# Patient Record
Sex: Male | Born: 1940 | Race: White | Hispanic: No | Marital: Married | Smoking: Former smoker
Health system: Southern US, Community
[De-identification: ages and names within clinical notes are randomized; demographics above are authoritative.]

## PROBLEM LIST (undated history)

## (undated) DIAGNOSIS — M5134 Other intervertebral disc degeneration, thoracic region: Secondary | ICD-10-CM

## (undated) DIAGNOSIS — Z955 Presence of coronary angioplasty implant and graft: Secondary | ICD-10-CM

## (undated) DIAGNOSIS — H3530 Unspecified macular degeneration: Secondary | ICD-10-CM

## (undated) DIAGNOSIS — Z8614 Personal history of Methicillin resistant Staphylococcus aureus infection: Secondary | ICD-10-CM

## (undated) DIAGNOSIS — I2510 Atherosclerotic heart disease of native coronary artery without angina pectoris: Secondary | ICD-10-CM

## (undated) DIAGNOSIS — I10 Essential (primary) hypertension: Secondary | ICD-10-CM

## (undated) DIAGNOSIS — R011 Cardiac murmur, unspecified: Secondary | ICD-10-CM

## (undated) DIAGNOSIS — E785 Hyperlipidemia, unspecified: Secondary | ICD-10-CM

## (undated) DIAGNOSIS — L309 Dermatitis, unspecified: Secondary | ICD-10-CM

## (undated) DIAGNOSIS — I219 Acute myocardial infarction, unspecified: Secondary | ICD-10-CM

## (undated) DIAGNOSIS — K219 Gastro-esophageal reflux disease without esophagitis: Secondary | ICD-10-CM

## (undated) DIAGNOSIS — D649 Anemia, unspecified: Secondary | ICD-10-CM

## (undated) DIAGNOSIS — N189 Chronic kidney disease, unspecified: Secondary | ICD-10-CM

## (undated) DIAGNOSIS — E039 Hypothyroidism, unspecified: Secondary | ICD-10-CM

## (undated) DIAGNOSIS — M1990 Unspecified osteoarthritis, unspecified site: Secondary | ICD-10-CM

## (undated) DIAGNOSIS — I739 Peripheral vascular disease, unspecified: Secondary | ICD-10-CM

## (undated) DIAGNOSIS — E119 Type 2 diabetes mellitus without complications: Secondary | ICD-10-CM

## (undated) HISTORY — DX: Peripheral vascular disease, unspecified: I73.9

## (undated) HISTORY — DX: Essential (primary) hypertension: I10

## (undated) HISTORY — DX: Anemia, unspecified: D64.9

## (undated) HISTORY — DX: Unspecified osteoarthritis, unspecified site: M19.90

## (undated) HISTORY — PX: VASECTOMY: SHX75

## (undated) HISTORY — DX: Atherosclerotic heart disease of native coronary artery without angina pectoris: I25.10

## (undated) HISTORY — DX: Unspecified macular degeneration: H35.30

## (undated) HISTORY — PX: JOINT REPLACEMENT: SHX530

## (undated) HISTORY — PX: TONSILLECTOMY: SUR1361

## (undated) HISTORY — DX: Hyperlipidemia, unspecified: E78.5

## (undated) HISTORY — PX: BACK SURGERY: SHX140

## (undated) HISTORY — DX: Dermatitis, unspecified: L30.9

## (undated) HISTORY — PX: CORONARY ANGIOPLASTY: SHX604

---

## 1997-03-04 DIAGNOSIS — I219 Acute myocardial infarction, unspecified: Secondary | ICD-10-CM

## 1997-03-04 HISTORY — DX: Acute myocardial infarction, unspecified: I21.9

## 2008-03-14 ENCOUNTER — Ambulatory Visit: Payer: Self-pay

## 2009-08-16 ENCOUNTER — Ambulatory Visit: Payer: Self-pay | Admitting: Internal Medicine

## 2010-12-07 ENCOUNTER — Ambulatory Visit: Payer: Self-pay | Admitting: Orthopedic Surgery

## 2010-12-17 ENCOUNTER — Encounter: Payer: Self-pay | Admitting: Orthopedic Surgery

## 2010-12-31 ENCOUNTER — Ambulatory Visit: Payer: Self-pay | Admitting: Orthopedic Surgery

## 2011-01-03 ENCOUNTER — Encounter: Payer: Self-pay | Admitting: Orthopedic Surgery

## 2011-01-15 ENCOUNTER — Inpatient Hospital Stay: Payer: Self-pay | Admitting: Orthopedic Surgery

## 2011-01-17 LAB — PATHOLOGY REPORT

## 2011-02-12 ENCOUNTER — Encounter: Payer: Self-pay | Admitting: Orthopedic Surgery

## 2011-02-12 ENCOUNTER — Ambulatory Visit: Payer: Self-pay | Admitting: Internal Medicine

## 2011-03-04 ENCOUNTER — Ambulatory Visit: Payer: Self-pay | Admitting: Internal Medicine

## 2011-03-05 ENCOUNTER — Encounter: Payer: Self-pay | Admitting: Orthopedic Surgery

## 2011-04-08 ENCOUNTER — Encounter: Payer: Self-pay | Admitting: Orthopedic Surgery

## 2011-05-06 ENCOUNTER — Encounter: Payer: Self-pay | Admitting: Orthopedic Surgery

## 2011-05-10 ENCOUNTER — Ambulatory Visit: Payer: Self-pay | Admitting: Orthopedic Surgery

## 2011-06-03 ENCOUNTER — Encounter: Payer: Self-pay | Admitting: Orthopedic Surgery

## 2011-12-05 ENCOUNTER — Ambulatory Visit: Payer: Self-pay | Admitting: Orthopedic Surgery

## 2011-12-30 ENCOUNTER — Ambulatory Visit: Payer: Self-pay | Admitting: Orthopedic Surgery

## 2011-12-30 LAB — PROTIME-INR: Prothrombin Time: 12.8 secs (ref 11.5–14.7)

## 2012-02-13 ENCOUNTER — Inpatient Hospital Stay: Payer: Self-pay | Admitting: Orthopedic Surgery

## 2012-02-14 LAB — BASIC METABOLIC PANEL
Anion Gap: 9 (ref 7–16)
Calcium, Total: 8 mg/dL — ABNORMAL LOW (ref 8.5–10.1)
Chloride: 105 mmol/L (ref 98–107)
Creatinine: 1.23 mg/dL (ref 0.60–1.30)
EGFR (African American): >60
EGFR (Non-African Amer.): 58 — ABNORMAL LOW
Osmolality: 276 (ref 275–301)

## 2012-02-14 LAB — PLATELET COUNT: Platelet: 153 10*3/uL (ref 150–440)

## 2012-02-14 LAB — HEMOGLOBIN: HGB: 12.4 g/dL — ABNORMAL LOW (ref 13.0–18.0)

## 2012-02-17 LAB — PATHOLOGY REPORT

## 2012-03-04 DIAGNOSIS — Z955 Presence of coronary angioplasty implant and graft: Secondary | ICD-10-CM

## 2012-03-04 HISTORY — DX: Presence of coronary angioplasty implant and graft: Z95.5

## 2012-03-06 ENCOUNTER — Other Ambulatory Visit: Payer: Self-pay

## 2012-03-10 LAB — WOUND CULTURE

## 2013-01-20 ENCOUNTER — Ambulatory Visit: Payer: Self-pay | Admitting: Physical Medicine and Rehabilitation

## 2013-02-01 DIAGNOSIS — Z8614 Personal history of Methicillin resistant Staphylococcus aureus infection: Secondary | ICD-10-CM

## 2013-02-01 HISTORY — DX: Personal history of Methicillin resistant Staphylococcus aureus infection: Z86.14

## 2013-02-17 ENCOUNTER — Ambulatory Visit: Payer: Self-pay | Admitting: Internal Medicine

## 2013-02-18 LAB — CK TOTAL AND CKMB (NOT AT ARMC)
CK, Total: 62 U/L (ref 35–232)
CK-MB: 1.8 ng/mL (ref 0.5–3.6)

## 2013-02-18 LAB — BASIC METABOLIC PANEL
Anion Gap: 4 — ABNORMAL LOW (ref 7–16)
BUN: 15 mg/dL (ref 7–18)
Calcium, Total: 9.4 mg/dL (ref 8.5–10.1)
Creatinine: 1.13 mg/dL (ref 0.60–1.30)
EGFR (Non-African Amer.): >60
Osmolality: 275 (ref 275–301)
Potassium: 4.3 mmol/L (ref 3.5–5.1)
Sodium: 136 mmol/L (ref 136–145)

## 2013-03-15 ENCOUNTER — Other Ambulatory Visit: Payer: Self-pay

## 2013-03-17 ENCOUNTER — Ambulatory Visit: Payer: Self-pay

## 2013-03-19 LAB — WOUND CULTURE

## 2014-06-21 NOTE — Discharge Summary (Signed)
PATIENT NAME:  [name] [name], LUKEHART MR#:  [id number] DATE OF BIRTH:  1940/04/08  ADMITTING DIAGNOSIS:  Right knee osteoarthritis.   DISCHARGE DIAGNOSIS:  Right knee osteoarthritis.   PROCEDURE:  Right total knee replacement.   ANESTHESIA:  Spinal.   SURGEON:  Laurene Footman, M.D.   ASSISTANT: Duanne Guess, PA-C.   ESTIMATED BLOOD LOSS: 75 mL.  TOURNIQUET TIME: 74 minutes at 300 mmHg.   COMPLICATIONS: None.   SPECIMEN:  Cut ends of bone.   IMPLANTS:   Medacta GMK primary total knee system, 6 standard femur, right fixed tibia, 10 mm standard tibial insert and a #4 patella, all components cemented.   HISTORY OF PRESENT ILLNESS:  The patient is 74 year old white male who has been having knee pain for the last few years. He was tried anti-inflammatory medications, physical therapy, cortisone injections and Synvisc injections with no relief. The patient continues to have pain despite nonoperative measures. The right knee continues to pop. He was scheduled for operation previously but due to unforeseen circumstances, the surgery was canceled.   PHYSICAL EXAMINATION: GENERAL: Well developed, well nourished.  Alert and oriented x3, no acute distress. Sensation intact to the right lower extremity.  CARDIOVASCULAR: Capillary refill brisk, no edema or effusion of the right lower extremity.  HEENT: Normocephalic, atraumatic. Pupils equal, round, and reactive light and accommodation.  HEART: Regular rate and rhythm. No murmurs.  RESPIRATORY:  Clear to auscultation bilaterally. No wheezes, rhonchi or crackles. Chest expansion is symmetrical.  MUSCULOSKELETAL: Right knee range of motion is equal, full and painless. Tender to palpation over the medial joint line. Ligamentous exam is stable. Negative McMurray. His knee range of motion is 0 to 95 degrees.  SKIN: No scars, rashes or lesions over the right lower extremity.   HOSPITAL COURSE:  The patient was admitted hospital on 02/13/2012. He had surgery  that same day and was brought to the orthopedic floor from the PAC-U in stable condition. On postoperative day 1, the patient's vitals and lab work were monitored. The patient's labs and vital signs stable. The patient progressed well on postoperative day 1 with physical therapy. On postoperative day 2 the patient was doing well, and progressed to 180 feet of ambulation. By postoperative day 3, the patient was stable and ready for discharge home with home physical therapy.   DISCHARGE INSTRUCTIONS:  The patient may gradually increase weight-bearing on the affected extremity.  Thigh-high TED hose on both legs and remove at bedtime. Replace when arising the next morning. He may resume a regular diet as tolerated. Resume typical home medications. He should take Xarelto 10 mg every morning until completed.   PAIN MEDICATIONS: Tylenol 650 to 1000 mg every 6 hours as needed for pain, oxycodone 5 to 10 mg every 4 hours as needed for pain. Wound care: Continue using Polar Care unit, maintaining temperature between 40 and 50 degrees Fahrenheit. Do not get the dressing or bandage wet or dirty. Call [facility] if the dressing gets water under it. Leave dressing on.  Symptoms to report:  Call [facility] if any of the following occur: Bright red bleeding from the incision wound, fever above 101.5 degrees, redness, swelling or drainage at the incision. Call [facility] if you experience any increased pain, numbness or weakness in legs, bowel or bladder symptoms.   REFERRALS: The patient referred to home physical therapy for continuation of rehab program. Please call [facility] if a therapist has not contacted you within 102  hours of your return home. The patient has a follow-up appointment at [facility] in two weeks, and he should call and schedule an appointment.   DISCHARGE MEDICATIONS: Metformin 500 mg oral tablet 1 tab 2 times a day,  Crestor 20 mg oral tablet 1 tablet orally once a day at bedtime, Prilosec OTC 20 mg oral delayed-release tablet 1 tablet orally once a day at bedtime, NitroQuick 1 tab sublingual as needed.  PreserVision oral tablet 1 tablet orally 2 times a day stopped 14 days preoperatively, Vitamin B12 2500 mcg 1 tab orally once a day in the morning, Tramadol 50 mg oral tablet 1 tablet orally 2 times a day, triamcinolone cream apply topically to rash on legs as needed.     ____________________________ Duanne Guess, PA-C tcg:ct D: 02/16/2012 03:06:00 ET T: 02/17/2012 10:17:52 ET JOB#: [id number]  cc: Duanne Guess, PA-C, <Dictator> Duanne Guess PA ELECTRONICALLY SIGNED 02/19/2012 13:22

## 2014-06-21 NOTE — Op Note (Signed)
PATIENT NAME:  [name] [name], [name] [name] MR#:  [id number] DATE OF BIRTH:  [birth year]  DATE OF PROCEDURE:  02/13/2012  PREOPERATIVE DIAGNOSIS: Right knee osteoarthritis.   POSTOPERATIVE DIAGNOSIS: Right knee osteoarthritis.   PROCEDURE: Right total knee replacement.   ANESTHESIA: Spinal.   SURGEON: Laurene Footman, MD  ASSISTANT: Rachelle Hora, PA-C.   DESCRIPTION OF PROCEDURE: Patient was brought to the Operating Room and after adequate spinal anesthesia was obtained, the right leg was prepped and draped in the usual sterile fashion with a tourniquet applied to the upper thigh. An Alvarado legholder was used during the procedure to maintain flexion and extension as required. After prepping and draping in the usual sterile fashion, appropriate patient identification and timeout procedures were carried out the leg was exsanguinated with an Esmarch and the tourniquet raised to 300 mmHg. A midline skin incision was made followed by a medial parapatellar arthrotomy. Inspection of the knee revealed exposed and eburnated bone on the medial femoral condyle, bone as well as on the tibial condyle. The patellofemoral joint also had exposed bone for most of the patellar surface and the lateral compartment had mild to moderate degenerative change present. The anterior cruciate ligament was excised along with the fat pad. The proximal tibia cutting block was applied from the [place] preoperative template and alignment checked and proximal tibia cut carried out with excision of the medial and lateral meniscus. Next, the femoral cutting block was applied. Anterior, posterior, and chamfer cuts made after checking alignment. The 6 cutting guide was applied to do so along with the notch cut for the trochlear groove and distal femoral drill holes after trial components fit well. On the tibial side a size 5 tibia was utilized and the central hole drilled. After trials fit well with good stability in flexion and extension with a 10 mm  standard implant, patella was cut with the guide and measured a 4. These were the same size as he had on the contralateral side previously. The bony surfaces were thoroughly irrigated and dried. Posterior capsules injected with a combination of morphine, Toradol and Sensorcaine. The tibial component was cemented into place first followed by the polyethylene, followed by the femur and the knee is held in extension with the patellar button clamped into place. All components cemented with antibiotic cement with his history of diabetes. After the cement had set, excess cement was removed and the knee was thoroughly irrigated. The tourniquet was let down at the close of the case. The arthrotomy was closed using a heavy quill suture, 2-0 quill subcutaneously and skin staples. Xeroform, 4 x 4's, ABD, Webril, and Ace wrap were applied along with a knee immobilizer and Polar Care. Patient was sent to recovery room in stable condition.   ESTIMATED BLOOD LOSS: 75 mL.  TOURNIQUET TIME: 74 minutes at 300 mmHg.   COMPLICATIONS: None.   SPECIMEN: Cut ends of bone.   IMPLANTS: Medacta GMK primary total knee system, 6 standard femur, right fixed tibia, 10 mm standard tibial insert and a 4 patella, all components cemented.    ____________________________ Laurene Footman, MD mjm:cms D: 02/13/2012 13:13:03 ET T: 02/13/2012 13:22:32 ET JOB#: [id number]  cc: Laurene Footman, MD, <Dictator>  Laurene Footman MD ELECTRONICALLY SIGNED 02/13/2012 15:33

## 2014-06-24 NOTE — Discharge Summary (Signed)
PATIENT NAME:  [name] [name], [name] [name] MR#:  [id number] DATE OF BIRTH:  14-Jul-1940  DATE OF ADMISSION:  02/17/2013 DATE OF DISCHARGE:  02/18/2013  DISCHARGE DIAGNOSIS: 1.  Stable angina.  2.  Hypertension.  3.  Hyperlipidemia.  4.  Diabetes.   HISTORY: This is a 74 year old male with hypertension, hyperlipidemia, diabetes, and coronary artery disease status post previous PCI and stent placement of left anterior descending artery who had had progressive symptoms of [place] class III anginal equivalent of which he had a stress test showing inferior myocardial perfusion defect consistent with an intermediate to high risk procedure and underwent cardiac catheterization showing patent stent and a 60% mid LAD stenosis with a 40% circumflex stenosis and a 95% right coronary artery stenosis. The patient underwent PCI and bare metal stent placement of the mid right coronary artery without complication and has done fairly well. The patient had appropriate hydration without complications. He was ambulating well without any further symptoms and discharged to home in good condition.   DISCHARGE MEDICATIONS: 1.  Metformin 500 mg twice per day, although we will hold this for 2 days and restart thereafter. 2.  Crestor 20 mg each day. 3.  Aspirin 325 mg each day. 4.  Plavix 75 mg each day. 5.  Tramadol 50 mg twice a day. 6.  Cyclobenzaprine 10 mg each day.   DISCHARGE INSTRUCTIONS: He is to follow up in 2 weeks with Dr. [name] for further treatment options. ____________________________ Corey Skains, MD bjk:sb D: 02/18/2013 08:02:07 ET T: 02/18/2013 08:49:35 ET JOB#: [id number]  cc: Corey Skains, MD, <Dictator> Corey Skains MD ELECTRONICALLY SIGNED 03/02/2013 8:04

## 2014-08-22 ENCOUNTER — Encounter: Payer: Medicare Other | Attending: Surgery | Admitting: Surgery

## 2014-08-22 DIAGNOSIS — L97211 Non-pressure chronic ulcer of right calf limited to breakdown of skin: Secondary | ICD-10-CM | POA: Insufficient documentation

## 2014-08-22 DIAGNOSIS — I252 Old myocardial infarction: Secondary | ICD-10-CM | POA: Diagnosis not present

## 2014-08-22 DIAGNOSIS — I87311 Chronic venous hypertension (idiopathic) with ulcer of right lower extremity: Secondary | ICD-10-CM | POA: Insufficient documentation

## 2014-08-22 DIAGNOSIS — D649 Anemia, unspecified: Secondary | ICD-10-CM | POA: Insufficient documentation

## 2014-08-22 DIAGNOSIS — E11622 Type 2 diabetes mellitus with other skin ulcer: Secondary | ICD-10-CM | POA: Diagnosis not present

## 2014-08-22 DIAGNOSIS — I10 Essential (primary) hypertension: Secondary | ICD-10-CM | POA: Insufficient documentation

## 2014-08-22 DIAGNOSIS — Z87891 Personal history of nicotine dependence: Secondary | ICD-10-CM | POA: Insufficient documentation

## 2014-08-22 NOTE — Progress Notes (Signed)
KENNER, LEWAN ([id number]) Visit Report for 08/22/2014 Chief Complaint Document Details Patient Name: [name] [name] Date of Service: 08/22/2014 9:00 AM Medical Record Number: [id number] Patient Account Number: [id number] Date of Birth/Sex: [birth year] (74 y.o. Male) Treating RN: Primary Care Physician: Fulton Reek Other Clinician: Referring Physician: Fulton Reek Treating Physician/Extender: Frann Rider in Treatment: 0 Information Obtained from: Patient Chief Complaint Patient presents to the wound care center for a consult due non healing wound. Swelling and ulceration of the right lower extremity for about a month. Electronic Signature(s) Signed: 08/22/2014 12:15:23 PM By: Christin Fudge MD, FACS Entered By: Christin Fudge on 08/22/2014 10:31:13 [name] [name] ([id number]) -------------------------------------------------------------------------------- Debridement Details Patient Name: [name] [name] Date of Service: 08/22/2014 9:00 AM Medical Record Number: [id number] Patient Account Number: [id number] Date of Birth/Sex: December 08, 1940 (74 y.o. Male) Treating RN: Primary Care Physician: Fulton Reek Other Clinician: Referring Physician: Fulton Reek Treating Physician/Extender: Frann Rider in Treatment: 0 Debridement Performed for Wound #1 Right Lower Leg Assessment: Performed By: Physician Pat Patrick., MD Debridement: Open Wound/Selective Debridement Selective Description: Pre-procedure Yes Verification/Time Out Taken: Start Time: 10:17 Pain Control: Lidocaine 4% Topical Solution Level: Skin/Dermis Total Area Debrided (L x 2.2 (cm) x 2.6 (cm) = 5.72 (cm) W): Tissue and other Non-Viable, Eschar, Exudate, Fibrin/Slough material debrided: Instrument: Forceps, Scissors, Other Bleeding: Minimum Hemostasis Achieved: Pressure End Time: 10:22 Procedural Pain: 0 Post Procedural Pain: 0 Response to Treatment: Procedure was tolerated well Post  Debridement Measurements of Total Wound Length: (cm) 2.2 Width: (cm) 2.6 Depth: (cm) 0.1 Volume: (cm) 0.449 Electronic Signature(s) Signed: 08/22/2014 12:15:23 PM By: Christin Fudge MD, FACS Entered By: Christin Fudge on 08/22/2014 10:30:11 [name] [name] ([id number]) -------------------------------------------------------------------------------- Debridement Details Patient Name: [name] [name] Date of Service: 08/22/2014 9:00 AM Medical Record Number: [id number] Patient Account Number: [id number] Date of Birth/Sex: 12/12/1940 (74 y.o. Male) Treating RN: Primary Care Physician: Fulton Reek Other Clinician: Referring Physician: Fulton Reek Treating Physician/Extender: Frann Rider in Treatment: 0 Debridement Performed for Wound #2 Right,Proximal,Posterior Lower Leg Assessment: Performed By: Physician Pat Patrick., MD Debridement: Open Wound/Selective Debridement Selective Description: Pre-procedure Yes Verification/Time Out Taken: Start Time: 10:17 Pain Control: Lidocaine 4% Topical Solution Level: Skin/Dermis Total Area Debrided (L x 1.9 (cm) x 1.2 (cm) = 2.28 (cm) W): Tissue and other Non-Viable, Eschar, Exudate, Fibrin/Slough material debrided: Instrument: Forceps, Scissors, Other Bleeding: Minimum Hemostasis Achieved: Pressure End Time: 10:22 Procedural Pain: 0 Post Procedural Pain: 0 Response to Treatment: Procedure was tolerated well Post Debridement Measurements of Total Wound Length: (cm) 1.9 Width: (cm) 1.2 Depth: (cm) 0.1 Volume: (cm) 0.179 Electronic Signature(s) Signed: 08/22/2014 12:15:23 PM By: Christin Fudge MD, FACS Entered By: Christin Fudge on 08/22/2014 10:30:43 [name] [name] ([id number]) -------------------------------------------------------------------------------- HPI Details Patient Name: [name] [name] Date of Service: 08/22/2014 9:00 AM Medical Record Number: [id number] Patient Account Number: [id number] Date of  Birth/Sex: [birth year] (74 y.o. Male) Treating RN: Primary Care Physician: Fulton Reek Other Clinician: Referring Physician: Fulton Reek Treating Physician/Extender: Frann Rider in Treatment: 0 History of Present Illness Location: right lower extremity Quality: Patient reports No Pain. Severity: Patient states wound are getting worse. Duration: Patient has had the wound for > 1 months prior to seeking treatment at the wound center Context: The wound appeared gradually over time Modifying Factors: Other treatment(s) tried include: local application of Neosporin ointment. Associated Signs and Symptoms: Patient reports having difficulty standing for long periods. HPI Description: 74 year old gentleman who is known to have diabetes mellitus type 2, arthritis, arrhythmia, hypertension, coronary artery disease, history of  PTCA, vein surgery, status post left and right total knee replacement, back surgery, previous surgery for varicose veins, comes with ulceration on his left lower extremity which has been there for over a month. He does [name] [name] his diabetes control is fairly good and his hemoglobin A1c done about 2 weeks ago was normal as per his doctor. About 4-5 years ago he saw the surgeons at the [facility] and he did have a endovenous procedure done but does not have any details and was told to wear compression stockings after this. He has not been wearing his compression stockings for a while and seldom wears them. Electronic Signature(s) Signed: 08/22/2014 12:15:23 PM By: Christin Fudge MD, FACS Entered By: Christin Fudge on 08/22/2014 10:34:04 [name] [name] ([id number]) -------------------------------------------------------------------------------- Physical Exam Details Patient Name: [name] [name] Date of Service: 08/22/2014 9:00 AM Medical Record Number: [id number] Patient Account Number: [id number] Date of Birth/Sex: 1940/09/05 (74 y.o.  Male) Treating RN: Primary Care Physician: Fulton Reek Other Clinician: Referring Physician: Fulton Reek Treating Physician/Extender: Frann Rider in Treatment: 0 Constitutional . Pulse regular. Respirations normal and unlabored. Afebrile. . Eyes Nonicteric. Reactive to light. Ears, Nose, Mouth, and Throat Lips, teeth, and gums WNL.[name] Moist mucosa without lesions . Neck supple and nontender. No palpable supraclavicular or cervical adenopathy. Normal sized without goiter. Respiratory WNL. No retractions.. Cardiovascular Pedal Pulses WNL. ABI on the left was 0.98 and on the right was 0.95.[name] No clubbing, cyanosis or but has mild edema and some evidence of stasis dermatitis and varicose veins.. Gastrointestinal (GI) Abdomen without masses or tenderness.. No liver or spleen enlargement or tenderness.. Musculoskeletal Adexa without tenderness or enlargement.. Digits and nails w/o clubbing, cyanosis, infection, petechiae, ischemia, or inflammatory conditions.. Integumentary (Hair, Skin) he has signs of stasis dermatitis on his right lower extremity and some dilated veins. He has ulceration covered with some rash currently need some sharp debridement.. No crepitus or fluctuance. No peri-wound warmth or erythema. No masses.[name] Psychiatric Judgement and insight Intact.. No evidence of depression, anxiety, or agitation.. Electronic Signature(s) Signed: 08/22/2014 12:15:23 PM By: Christin Fudge MD, FACS Entered By: Christin Fudge on 08/22/2014 10:35:17 [name] [name] ([id number]) -------------------------------------------------------------------------------- Physician Orders Details Patient Name: [name] [name] Date of Service: 08/22/2014 9:00 AM Medical Record Number: [id number] Patient Account Number: [id number] Date of Birth/Sex: 03-19-1940 (75 y.o. Male) Treating RN: Montey Hora Primary Care Physician: Fulton Reek Other Clinician: Referring Physician: Fulton Reek Treating Physician/Extender: Frann Rider in Treatment: 0 Verbal / Phone Orders: Yes Clinician: Montey Hora Read Back and Verified: Yes Diagnosis Coding Wound Cleansing Wound #1 Right Lower Leg o Clean wound with Normal Saline. Wound #2 Right,Proximal,Posterior Lower Leg o Clean wound with Normal Saline. Anesthetic Wound #1 Right Lower Leg o Topical Lidocaine 4% cream applied to wound bed prior to debridement Wound #2 Right,Proximal,Posterior Lower Leg o Topical Lidocaine 4% cream applied to wound bed prior to debridement Primary Wound Dressing Wound #1 Right Lower Leg o Aquacel Ag Wound #2 Right,Proximal,Posterior Lower Leg o Aquacel Ag Secondary Dressing Wound #1 Right Lower Leg o ABD pad Wound #2 Right,Proximal,Posterior Lower Leg o ABD pad Dressing Change Frequency Wound #1 Right Lower Leg o Change dressing every week Wound #2 Right,Proximal,Posterior Lower Leg o Change dressing every week Follow-up Appointments [name] [name], [name] [name] ([id number]) Wound #1 Right Lower Leg o Return Appointment in 1 week. Wound #2 Right,Proximal,Posterior Lower Leg o Return Appointment in 1 week. Edema Control Wound #1 Right Lower Leg o Unna Boot to Right Lower Extremity Wound #2  Right,Proximal,Posterior Lower Leg o Unna Boot to Right Lower Extremity Services and Therapies o Venous Studies -Bilateral oooo Electronic Signature(s) Signed: 08/22/2014 12:15:23 PM By: Christin Fudge MD, FACS Signed: 08/22/2014 5:02:38 PM By: Montey Hora Entered By: Montey Hora on 08/22/2014 10:27:09 [name] [name] ([id number]) -------------------------------------------------------------------------------- Problem List Details Patient Name: [name] [name] Date of Service: 08/22/2014 9:00 AM Medical Record Number: [id number] Patient Account Number: [id number] Date of Birth/Sex: [birth year] (74 y.o. 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 Male) Treating RN: Primary Care Physician: Fulton Reek Other Clinician: Referring Physician: Fulton Reek Treating Physician/Extender: Frann Rider in Treatment: 0 Subjective Chief Complaint Information obtained from Patient Patient presents to the wound care center for a consult due non healing wound. Swelling and ulceration of the right lower extremity for about a month. History of Present Illness (HPI) The following HPI elements were documented for the patient's wound: Location: right lower extremity Quality: Patient reports No Pain. Severity: Patient states wound are getting worse. Duration: Patient has had the wound for > 1 months prior to seeking treatment at the wound center Context: The wound appeared gradually over time Modifying Factors: Other treatment(s) tried include: local application of Neosporin ointment. Associated Signs and Symptoms: Patient reports having difficulty standing for long periods. 74 year old gentleman who is known to  have diabetes mellitus type 2, arthritis, arrhythmia, hypertension, coronary artery disease, history of PTCA, vein surgery, status post left and right total knee replacement, back surgery, previous surgery for varicose veins, comes with ulceration on his left lower extremity which has been there for over a month. He does [name] [name] his diabetes control is fairly good and his hemoglobin A1c done about 2 weeks ago was normal as per his doctor. About 4-5 years ago he saw the surgeons at the [facility] and he did have a endovenous procedure done but does not have any details and was told to wear compression stockings after this. He has not been wearing his compression stockings for a while and seldom wears them. Wound History Patient presents with 1 open wound that has been present for approximately 3 weeks. Patient has been treating wound in the following manner: neosporin. The wound has been healed in the past but has re- opened. Laboratory tests have been performed in the last month. Patient reportedly has tested positive for an antibiotic resistant organism. Patient reportedly has not tested positive for osteomyelitis. Patient reportedly has had testing performed to evaluate circulation in the legs. Patient experiences the following problems associated with their wounds: swelling. Patient History Information obtained from Patient. Allergies HUGHEY, RITTENBERRY ([id number]) Sulfa drugs (Reaction: hives) Family History Cancer - Siblings, Diabetes - Paternal Grandparents, Hypertension - Father, Stroke - Father, No family history of Heart Disease, Hereditary Spherocytosis, Kidney Disease, Lung Disease, Seizures, Thyroid Problems, Tuberculosis. Social History Former smoker - 2PPD-  quit 1999, Marital Status - Widowed, Alcohol Use - Moderate - 1 beer/day, Drug Use - No History, Caffeine Use - Daily. Medical History Eyes Denies history of Cataracts, Glaucoma, Optic  Neuritis Ear/Nose/Mouth/Throat Denies history of Chronic sinus problems/congestion, Middle ear problems Hematologic/Lymphatic Patient has history of Anemia Denies history of Hemophilia, Human Immunodeficiency Virus, Lymphedema, Sickle Cell Disease Respiratory Denies history of Aspiration, Asthma, Chronic Obstructive Pulmonary Disease (COPD), Pneumothorax, Sleep Apnea, Tuberculosis Cardiovascular Patient has history of Arrhythmia, Coronary Artery Disease, Hypertension, Myocardial Infarction Denies history of Angina, Congestive Heart Failure, Deep Vein Thrombosis, Hypotension, Peripheral Arterial Disease, Peripheral Venous Disease, Phlebitis, Vasculitis Gastrointestinal Denies history of Cirrhosis , Colitis, Crohn s, Hepatitis A, Hepatitis B, Hepatitis C Endocrine Patient has history of Type II Diabetes Genitourinary Denies history of End Stage Renal Disease Immunological Denies history of Lupus Erythematosus, Raynaud s, Scleroderma Integumentary (Skin) Denies history of History of Burn, History of pressure wounds Musculoskeletal Patient has history of Osteoarthritis Denies history of Gout, Rheumatoid Arthritis, Osteomyelitis Neurologic Patient has history of Neuropathy Denies history of Dementia, Quadriplegia, Paraplegia, Seizure Disorder Oncologic Denies history of Received Chemotherapy, Received Radiation Psychiatric Denies history of Anorexia/bulimia, Confinement Anxiety Patient is treated with Oral Agents. Blood sugar is tested. Blood sugar results noted at the following times: Breakfast - 120-150. [name] [name], [name] [name] ([id number]) Hospitalization/Surgery History - tonsillectomy. - 10/02/1977, vasectomy. - 03/04/1997, PCI and stent placement LAD. - 03/04/2012, PCI and care metal stent placement mid-RCA. - 01/15/2011, [facility], (L) TKA. - 02/13/2012, [place], (R) TKA. - 03/05/2007, (R) THA. - 04/30/2013, [facility], Back surgery. Medical And Surgical History Notes Constitutional Symptoms (General  Health) HLD Eyes Ocular albinism; macular degeneration Ear/Nose/Mouth/Throat seasonal allergies: (L) hearing loss Cardiovascular Arteriosclerotic cardiovascular disease (ASCVD); Percutaneous transluminal coronary angioplasty (PTCA); Peripheral vascular disease; heart disease Genitourinary Nephrolithiasis Musculoskeletal DDD Review of Systems (ROS) Constitutional Symptoms (General Health) The patient has no complaints or symptoms. Eyes Complains or has symptoms of Glasses / Contacts. Ear/Nose/Mouth/Throat The patient has no complaints or symptoms. Hematologic/Lymphatic The patient has no complaints or symptoms. Respiratory The patient has no complaints or symptoms. Cardiovascular Complains or has symptoms of LE edema. Gastrointestinal The patient has no complaints or symptoms. Endocrine Complains or has symptoms of Thyroid disease. Genitourinary The patient has no complaints or symptoms. Immunological The patient has no complaints or symptoms. Integumentary (Skin) Complains or has symptoms of Wounds, Swelling. Musculoskeletal The patient has no complaints or symptoms. Neurologic Complains or has symptoms of Numbness/parasthesias. Denies complaints or symptoms of Focal/Weakness. Oncologic The patient has no complaints or symptoms. Psychiatric The patient has no complaints or symptoms. [name] [name], [name] [name] ([id number]) Medications aspirin 81 mg tablet,delayed release oral tablet,delayed release (DR/EC) oral tramadol 50 mg tablet oral tablet oral gabapentin 300 mg capsule oral capsule oral meclizine 25 mg tablet oral tablet oral metformin 500 mg tablet oral tablet oral glimepiride 4 mg tablet oral tablet oral Crestor 20 mg tablet oral tablet oral lisinopril 5 mg tablet oral tablet oral PreserVision Lutein 226 mg-200 unit-5 mg-0.8 mg capsule oral capsule oral furosemide 20 mg tablet oral tablet oral naproxen 500 mg tablet oral tablet oral clopidogrel 75 mg tablet oral  tablet oral omeprazole 40 mg capsule,delayed release oral capsule,delayed release(DR/EC) oral cyclobenzaprine 10 mg tablet oral tablet oral nitroglycerin 0.4 mg sublingual tablet sublingual tablet, sublingual sublingual cyanocobalamin (vitamin B-12) 2,500 mcg tablet oral tablet oral Objective Constitutional Pulse regular. Respirations normal and unlabored. Afebrile. Vitals Time Taken: 9:00 AM, Height: 72 in, Source: Stated, Weight: 244.7 lbs, Source: Measured, BMI: 33.2, Temperature: 98.0  F, Pulse: 82 bpm, Respiratory Rate: 16 breaths/min, Blood Pressure: 120/78 mmHg. Eyes Nonicteric. Reactive to light. Ears, Nose, Mouth, and Throat Lips, teeth, and gums WNL.[name] Moist mucosa without lesions . Neck supple and nontender. No palpable supraclavicular or cervical adenopathy. Normal sized without goiter. Respiratory WNL. No retractions.. Cardiovascular [name] [name], [name] [name] ([id number]) Pedal Pulses WNL. ABI on the left was 0.98 and on the right was 0.95.[name] No clubbing, cyanosis or but has mild edema and some evidence of stasis dermatitis and varicose veins.. Gastrointestinal (GI) Abdomen without masses or tenderness.. No liver or spleen enlargement or tenderness.. Musculoskeletal Adexa without tenderness or enlargement.. Digits and nails w/o clubbing, cyanosis, infection, petechiae, ischemia, or inflammatory conditions.[name] Psychiatric Judgement and insight Intact.. No evidence of depression, anxiety, or agitation.. Integumentary (Hair, Skin) he has signs of stasis dermatitis on his right lower extremity and some dilated veins. He has ulceration covered with some rash currently need some sharp debridement.. No crepitus or fluctuance. No peri-wound warmth or erythema. No masses.. Wound #1 status is Open. Original cause of wound was Gradually Appeared. The wound is located on the Right Lower Leg. The wound measures 2.2cm length x 2.6cm width x 0.1cm depth; 4.492cm^2 area and 0.449cm^3 volume. The  wound is limited to skin breakdown. There is no tunneling or undermining noted. There is a small amount of serosanguineous drainage noted. The wound margin is indistinct and nonvisible. There is large (67-100%) red, pink granulation within the wound bed. There is a small (1-33%) amount of necrotic tissue within the wound bed including Eschar and Adherent Slough. The periwound skin appearance exhibited: Localized Edema, Dry/Scaly, Moist, Erythema. The periwound skin appearance did not exhibit: Callus, Crepitus, Excoriation, Fluctuance, Friable, Induration, Rash, Scarring, Maceration, Atrophie Blanche, Cyanosis, Ecchymosis, Hemosiderin Staining, Mottled, Pallor, Rubor. The surrounding wound skin color is noted with erythema. Periwound temperature was noted as No Abnormality. Wound #2 status is Open. Original cause of wound was Gradually Appeared. The wound is located on the Right,Proximal,Posterior Lower Leg. The wound measures 1.9cm length x 1.2cm width x 0.1cm depth; 1.791cm^2 area and 0.179cm^3 volume. The wound is limited to skin breakdown. There is no tunneling or undermining noted. There is a small amount of serous drainage noted. The wound margin is flat and intact. There is large (67-100%) red granulation within the wound bed. There is no necrotic tissue within the wound bed. The periwound skin appearance did not exhibit: Callus, Crepitus, Excoriation, Fluctuance, Friable, Induration, Localized Edema, Rash, Scarring, Dry/Scaly, Maceration, Moist, Atrophie Blanche, Cyanosis, Ecchymosis, Hemosiderin Staining, Mottled, Pallor, Rubor, Erythema. Periwound temperature was noted as No Abnormality. 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 He is Showing signs of venous hypertension in this extremity and has signs of status dermatitis and ulceration. Recommended Prisma AG over his wounds and a Unna's boot. We have discussed the need to see his vascular surgeons again to get a venous duplex study done and some revision surgery if required as per the opinion of the vascular surgeons. we'll continue to see him on a weekly basis until his wound heals and then we would recommend 20-30 mm compression stockings to be worn daily from  morning until night. He also understands the importance of elevation of the limbs while he is sitting or sleeping and he will continue to be compliant and see as regularly every week. Procedures Wound #1 Wound #1 is a Diabetic Wound/Ulcer of the Lower Extremity located on the Right Lower Leg . There was a Skin/Dermis Open Wound/Selective [phone]) debridement with total area of 5.72 sq cm performed by Horris Speros, Jackson Latino., MD. with the following instrument(s): Forceps, , and Scissors to remove Non-Viable tissue/material including Exudate, Fibrin/Slough, and Eschar after achieving pain control using Lidocaine 4% Topical Solution. A time out was conducted prior to the start of the procedure. A Minimum amount of bleeding was controlled with Pressure. The procedure was tolerated well with a pain level of 0 throughout and a pain level of 0 following the procedure. Post Debridement Measurements: 2.2cm length x 2.6cm width x 0.1cm depth; 0.449cm^3 volume. Wound #2 Wound #2 is a Diabetic Wound/Ulcer of the Lower Extremity located on the Right,Proximal,Posterior Lower Leg . There was a Skin/Dermis Open Wound/Selective [phone]) debridement with total area of 2.28 sq cm performed by Brei Pociask, Jackson Latino., MD. with the following instrument(s): Forceps, , and  Scissors to remove Non-Viable tissue/material including Exudate, Fibrin/Slough, and Eschar after achieving pain control using Lidocaine 4% Topical Solution. A time out was conducted prior to the start of the procedure. A Minimum amount of bleeding was controlled with Pressure. The procedure was tolerated well with a pain level of 0 throughout and a pain level of 0 following the procedure. Post Debridement Measurements: 1.9cm length x 1.2cm width x 0.1cm depth; 0.179cm^3 volume. Plan [name] [name], [name] [name] ([id number]) Wound Cleansing: Wound #1 Right Lower Leg: Clean wound with Normal Saline. Wound #2 Right,Proximal,Posterior Lower Leg: Clean wound with Normal Saline. Anesthetic: Wound #1 Right Lower Leg: Topical Lidocaine 4% cream applied to wound bed prior to debridement Wound #2 Right,Proximal,Posterior Lower Leg: Topical Lidocaine 4% cream applied to wound bed prior to debridement Primary Wound Dressing: Wound #1 Right Lower Leg: Aquacel Ag Wound #2 Right,Proximal,Posterior Lower Leg: Aquacel Ag Secondary Dressing: Wound #1 Right Lower Leg: ABD pad Wound #2 Right,Proximal,Posterior Lower Leg: ABD pad Dressing Change Frequency: Wound #1 Right Lower Leg: Change dressing every week Wound #2 Right,Proximal,Posterior Lower Leg: Change dressing every week Follow-up Appointments: Wound #1 Right Lower Leg: Return Appointment in 1 week. Wound #2 Right,Proximal,Posterior Lower Leg: Return Appointment in 1 week. Edema Control: Wound #1 Right Lower Leg: Unna Boot to Right Lower Extremity Wound #2 Right,Proximal,Posterior Lower Leg: Unna Boot to Right Lower Extremity Services and Therapies ordered were: Venous Studies -Bilateral This pleasant 74 year old gentleman has a history of diabetes mellitus which is under control and has had endovenous ablation for his right lower extremity about 5 years ago. 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 [name] [name], [name] [name] ([id number]) Recommended Prisma [employer] over his wounds and a Unna's boot. We have discussed the need to see his vascular surgeons again to get a venous duplex study done and some revision surgery if required as per the opinion of the vascular surgeons. we'll continue to see him on a weekly basis until his wound heals and then we would recommend 20-30 mm compression stockings to be worn daily from morning until night. He also understands the importance of elevation of the limbs while he is sitting or sleeping and he will continue to be compliant and see as regularly every week. Electronic Signature(s) Signed: 08/22/2014 12:15:23 PM  By: Christin Fudge MD, FACS Entered By: Christin Fudge on 08/22/2014 10:43:36 [name] [name] ([id number]) -------------------------------------------------------------------------------- ROS/PFSH Details Patient Name: [name] [name] Date of Service: 08/22/2014 9:00 AM Medical Record Number: [id number] Patient Account Number: [id number] Date of Birth/Sex: [birth year] (74 y.o. Male) Treating RN: Junious Dresser Primary Care Physician: Fulton Reek Other Clinician: Referring Physician: Fulton Reek Treating Physician/Extender: Frann Rider in Treatment: 0 Information Obtained From Patient Wound History Do you currently have one or more open woundso Yes How many open wounds do you currently haveo 1 Approximately how long have you had your woundso 3 weeks How have you been treating your wound(s) until nowo neosporin Has your wound(s) ever healed and then re-openedo Yes Have you had any lab work done in the past montho Yes Who ordered the lab work doneo [place] Have you tested positive for an antibiotic resistant organism (MRSA, VRE)o Yes Date: 03/15/2013 Have you tested positive for osteomyelitis (bone infection)o No Have you had any tests for circulation on your legso Yes Who ordered the testo AVandV Have you had other  problems associated with your woundso Swelling Eyes Complaints and Symptoms: Positive for: Glasses / Contacts Medical History: Negative for: Cataracts; Glaucoma; Optic Neuritis Past Medical History Notes: Ocular albinism; macular degeneration Cardiovascular Complaints and Symptoms: Positive for: LE edema Medical History: Positive for: Arrhythmia; Coronary Artery Disease; Hypertension; Myocardial Infarction Negative for: Angina; Congestive Heart Failure; Deep Vein Thrombosis; Hypotension; Peripheral Arterial Disease; Peripheral Venous Disease; Phlebitis; Vasculitis Past Medical History Notes: Arteriosclerotic cardiovascular disease (ASCVD); Percutaneous transluminal coronary angioplasty (PTCA); Peripheral vascular disease; heart disease Endocrine [name] [name], [name] [name] ([id number]) Complaints and Symptoms: Positive for: Thyroid disease Medical History: Positive for: Type II Diabetes Time with diabetes: 15-20 years Treated with: Oral agents Blood sugar tested every day: Yes Tested : 1-2 times daily Blood sugar testing results: Breakfast: 120-150 Integumentary (Skin) Complaints and Symptoms: Positive for: Wounds; Swelling Medical History: Negative for: History of Burn; History of pressure wounds Neurologic Complaints and Symptoms: Positive for: Numbness/parasthesias Negative for: Focal/Weakness Medical History: Positive for: Neuropathy Negative for: Dementia; Quadriplegia; Paraplegia; Seizure Disorder Constitutional Symptoms (General Health) Complaints and Symptoms: No Complaints or Symptoms Medical History: Past Medical History Notes: HLD Ear/Nose/Mouth/Throat Complaints and Symptoms: No Complaints or Symptoms Medical History: Negative for: Chronic sinus problems/congestion; Middle ear problems Past Medical History Notes: seasonal allergies: (L) hearing loss Hematologic/Lymphatic [name] [name], [name] [name] ([id number]) Complaints and Symptoms: No Complaints or Symptoms Medical  History: Positive for: Anemia Negative for: Hemophilia; Human Immunodeficiency Virus; Lymphedema; Sickle Cell Disease Respiratory Complaints and Symptoms: No Complaints or Symptoms Medical History: Negative for: Aspiration; Asthma; Chronic Obstructive Pulmonary Disease (COPD); Pneumothorax; Sleep Apnea; Tuberculosis Gastrointestinal Complaints and Symptoms: No Complaints or Symptoms Medical History: Negative for: Cirrhosis ; Colitis; Crohnos; Hepatitis A; Hepatitis B; Hepatitis C Genitourinary Complaints and Symptoms: No Complaints or Symptoms Medical History: Negative for: End Stage Renal Disease Past Medical History Notes: Nephrolithiasis Immunological Complaints and Symptoms: No Complaints or Symptoms Medical History: Negative for: Lupus Erythematosus; Raynaudos; Scleroderma Musculoskeletal Complaints and Symptoms: No Complaints or Symptoms Medical History: Positive for: Osteoarthritis Negative for: Gout; Rheumatoid Arthritis; Osteomyelitis Past Medical History Notes: [name] [name], [name] [name] ([id number]) DDD Oncologic Complaints and Symptoms: No Complaints or Symptoms Medical History: Negative for: Received Chemotherapy; Received Radiation Psychiatric Complaints and Symptoms: No Complaints or Symptoms Medical History: Negative for: Anorexia/bulimia; Confinement Anxiety Immunizations Immunization Notes: Pt states he has had a tetanus shot done within the last 5 years Hospitalization / Surgery History Name of Hospital Purpose of Hospitalization/Surgery Date tonsillectomy vasectomy 10/02/1977 PCI and  stent placement [facility] 03/04/1997 PCI and care metal stent placement mid-RCA 03/04/2012 [facility] (L) TKA 01/15/2011 [facility] (R) TKA 02/13/2012 (R) THA 03/05/2007 [facility] Back surgery 04/30/2013 Family and Social History Cancer: Yes - Siblings; Diabetes: Yes - Paternal Grandparents; Heart Disease: No; Hereditary Spherocytosis: No; Hypertension: Yes - Father; Kidney Disease: No; Lung  Disease: No; Seizures: No; Stroke: Yes - Father; Thyroid Problems: No; Tuberculosis: No; Former smoker - 2PPD- quit 1999; Marital Status - Widowed; Alcohol Use: Moderate - 1 beer/day; Drug Use: No History; Caffeine Use: Daily; Advanced Directives: Yes (Not Provided); Patient does not want information on Advanced Directives; Living Will: Yes (Not Provided); Medical Power of Attorney: Yes - Son- [name] [name] (Not Provided) Physician Affirmation I have reviewed and agree with the above information. Electronic Signature(s) Signed: 08/22/2014 12:15:23 PM By: Christin Fudge MD, FACS Signed: 08/22/2014 4:12:08 PM By: Junious Dresser RN Entered By: Christin Fudge on 08/22/2014 09:56:35 [name] [name] ([id number]) TABER, SWEETSER ([id number]) -------------------------------------------------------------------------------- SuperBill Details Patient Name: [name] [name] Date of Service: 08/22/2014 Medical Record Number: [id number] Patient Account Number: [id number] Date of Birth/Sex: 05/08/1940 (74 y.o. 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 Facility Procedures CPT4 Code Description: 73403709 99213 - WOUND CARE VISIT-LEV 3 EST PT Modifier: Quantity: 1 CPT4 Code Description: [id number] 97597 - DEBRIDE WOUND 1ST 20 SQ CM OR < ICD-10 Description Diagnosis E11.622 Type 2 diabetes mellitus with other skin ulcer L97.211 Non-pressure chronic ulcer of right calf limited to b I87.311 Chronic venous hypertension  (idiopathic) with ulcer o Modifier: reakdown of f right lowe Quantity: 1 skin r extremity Physician  Procedures CPT4 Code Description: 4037543 60677 - WC PHYS LEVEL 4 - NEW PT ICD-10 Description Diagnosis E11.622 Type 2 diabetes mellitus with other skin ulcer L97.211 Non-pressure chronic ulcer of right calf limited to br I87.311 Chronic venous hypertension  (idiopathic) with ulcer of Modifier: eakdown of s right lower Quantity: 1 kin extremity CPT4 Code Description: 0340352 48185 - WC PHYS DEBR WO ANESTH 20 SQ CM ICD-10 Description Diagnosis E11.622 Type 2 diabetes mellitus with other skin ulcer L97.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

## 2014-08-22 NOTE — Progress Notes (Signed)
LEXINGTON, KROTZ ([id number]) Visit Report for 08/22/2014 Allergy List Details Patient Name: [name] [name], [name] [name] Date of Service: 08/22/2014 9:00 AM Medical Record Number: [id number] Patient Account Number: [id number] Date of Birth/Sex: [birth year] (74 y.o. Male) Treating RN: Junious Dresser Primary Care Physician: Fulton Reek Other Clinician: Referring Physician: Fulton Reek Treating Physician/Extender: Frann Rider in Treatment: 0 Allergies Active Allergies Sulfa drugs Reaction: hives Allergy Notes Electronic Signature(s) Signed: 08/22/2014 4:12:08 PM By: Junious Dresser RN Entered By: Junious Dresser on 08/22/2014 09:11:37 [name] [name] ([id number]) -------------------------------------------------------------------------------- Arrival Information Details Patient Name: [name] [name] Date of Service: 08/22/2014 9:00 AM Medical Record Number: [id number] Patient Account Number: [id number] Date of Birth/Sex: [birth year] (74 y.o. Male) Treating RN: Junious Dresser Primary Care Physician: Fulton Reek Other Clinician: Referring Physician: Fulton Reek Treating Physician/Extender: Frann Rider in Treatment: 0 Visit Information Patient Arrived: Ambulatory Arrival Time: 08:59 Accompanied By: self Transfer Assistance: None Patient Identification Verified: Yes Secondary Verification Process Yes Completed: Patient Has Alerts: Yes Patient Alerts: Patient on Blood Thinner DM II ASA 81 mg 6/15 ABI L-0.98 R-0.95 Electronic Signature(s) Signed: 08/22/2014 4:12:08 PM By: Junious Dresser RN Entered By: Junious Dresser on 08/22/2014 09:44:32 [name] [name] ([id number]) -------------------------------------------------------------------------------- Clinic Level of Care Assessment Details Patient Name: [name] [name] Date of Service: 08/22/2014 9:00 AM Medical Record Number: [id number] Patient Account Number: [id number] Date of Birth/Sex: [birth year] (74 y.o. Male) Treating RN:  Montey Hora Primary Care Physician: Fulton Reek Other Clinician: Referring Physician: Fulton Reek Treating Physician/Extender: Frann Rider in Treatment: 0 Clinic Level of Care Assessment Items TOOL 1 Quantity Score []  - Use when EandM and Procedure is performed on INITIAL visit 0 ASSESSMENTS - Nursing Assessment / Reassessment X - General Physical Exam (combine w/ comprehensive assessment (listed just 1 20 below) when performed on new pt. evals) X - Comprehensive Assessment (HX, ROS, Risk Assessments, Wounds Hx, etc.) 1 25 ASSESSMENTS - Wound and Skin Assessment / Reassessment []  - Dermatologic / Skin Assessment (not related to wound area) 0 ASSESSMENTS - Ostomy and/or Continence Assessment and Care []  - Incontinence Assessment and Management 0 []  - Ostomy Care Assessment and Management (repouching, etc.) 0 PROCESS - Coordination of Care X - Simple Patient / Family Education for ongoing care 1 15 []  - Complex (extensive) Patient / Family Education for ongoing care 0 X - Staff obtains Programmer, systems, Records, Test Results / Process Orders 1 10 []  - Staff telephones HHA, Nursing Homes / Clarify orders / etc 0 []  - Routine Transfer to another Facility (non-emergent condition) 0 []  - Routine Hospital Admission (non-emergent condition) 0 X - New Admissions / Biomedical engineer / Ordering NPWT, Apligraf, etc. 1 15 []  - Emergency Hospital Admission (emergent condition) 0 PROCESS - Special Needs []  - Pediatric / Minor Patient Management 0 []  - Isolation Patient Management 0 [name] [name], [name] [name] ([id number]) []  - Hearing / Language / Visual special needs 0 []  - Assessment of Community assistance (transportation, D/C planning, etc.) 0 []  - Additional assistance / Altered mentation 0 []  - Support Surface(s) Assessment (bed, cushion, seat, etc.) 0 INTERVENTIONS - Miscellaneous []  - External ear exam 0 []  - Patient Transfer (multiple staff / Civil Service fast streamer / Similar devices) 0 []  -  Simple Staple / Suture removal (25 or less) 0 []  - Complex Staple / Suture removal (26 or more) 0 []  - Hypo/Hyperglycemic Management (do not check if billed separately) 0 X - Ankle / Brachial Index (ABI) - do not check if billed separately 1 15 Has the patient been seen at the hospital within  the last three years: Yes Total Score: 100 Level Of Care: New/Established - Level 3 Electronic Signature(s) Signed: 08/22/2014 10:45:08 AM By: Montey Hora Entered By: Montey Hora on 08/22/2014 10:45:08 [name] [name] ([id number]) -------------------------------------------------------------------------------- Encounter Discharge Information Details Patient Name: [name] [name] Date of Service: 08/22/2014 9:00 AM Medical Record Number: [id number] Patient Account Number: [id number] Date of Birth/Sex: [birth year] (74 y.o. Male) Treating RN: Primary Care Physician: Fulton Reek Other Clinician: Referring Physician: Fulton Reek Treating Physician/Extender: Frann Rider in Treatment: 0 Encounter Discharge Information Items Schedule Follow-up Appointment: No Medication Reconciliation completed No and provided to Patient/Care Fount Bahe: Provided on Clinical Summary of Care: 08/22/2014 Form Type Recipient Paper Patient [place] Electronic Signature(s) Signed: 08/22/2014 10:36:20 AM By: Ruthine Dose Entered By: Ruthine Dose on 08/22/2014 10:36:20 [name] [name] ([id number]) -------------------------------------------------------------------------------- Lower Extremity Assessment Details Patient Name: [name] [name] Date of Service: 08/22/2014 9:00 AM Medical Record Number: [id number] Patient Account Number: [id number] Date of Birth/Sex: [birth year] (74 y.o. Male) Treating RN: Junious Dresser Primary Care Physician: Fulton Reek Other Clinician: Referring Physician: Fulton Reek Treating Physician/Extender: Frann Rider in Treatment: 0 Edema Assessment Assessed: Shirlyn Goltz: Yes]  Patrice Paradise: Yes] Edema: [Left: Yes] [Right: Yes] Calf Left: Right: Point of Measurement: 36 cm From Medial Instep 38 cm 38.5 cm Ankle Left: Right: Point of Measurement: 12 cm From Medial Instep 24.5 cm 26.3 cm Vascular Assessment Claudication: Claudication Assessment [Left:None] [Right:None] Pulses: Posterior Tibial Palpable: [Left:Yes] [Right:Yes] Doppler: [Left:Multiphasic] [Right:Multiphasic] Dorsalis Pedis Palpable: [Left:Yes] [Right:Yes] Doppler: [Left:Multiphasic] [Right:Multiphasic] Extremity colors, hair growth, and conditions: Extremity Color: [Left:Hyperpigmented] [Right:Hyperpigmented] Hair Growth on Extremity: [Left:Yes] [Right:Yes] Temperature of Extremity: [Left:Cool] [Right:Cool] Capillary Refill: [Left:< 3 seconds] [Right:< 3 seconds] Dependent Rubor: [Left:No] [Right:No] Blanched when Elevated: [Left:No] [Right:No] Blood Pressure: Brachial: [Left:116] [Right:110] Dorsalis Pedis: 108 [Left:Dorsalis Pedis: 110] Ankle: Posterior Tibial: 114 [Left:Posterior Tibial: 92 0.98] [Right:0.95] Toe Nail Assessment Left: Right: [name] [name], [name] [name] ([id number]) Thick: Yes Yes Discolored: Yes Yes Deformed: Yes Yes Improper Length and Hygiene: No No Electronic Signature(s) Signed: 08/22/2014 4:12:08 PM By: Junious Dresser RN Entered By: Junious Dresser on 08/22/2014 09:44:11 [name] [name] ([id number]) -------------------------------------------------------------------------------- Multi Wound Chart Details Patient Name: [name] [name] Date of Service: 08/22/2014 9:00 AM Medical Record Number: [id number] Patient Account Number: [id number] Date of Birth/Sex: 1940/04/22 (74 y.o. Male) Treating RN: Montey Hora Primary Care Physician: Fulton Reek Other Clinician: Referring Physician: Fulton Reek Treating Physician/Extender: Frann Rider in Treatment: 0 Vital Signs Height(in): 72 Pulse(bpm): 82 Weight(lbs): 244.7 Blood Pressure 120/78 (mmHg): Body Mass  Index(BMI): 33 Temperature(F): 98.0 Respiratory Rate 16 (breaths/min): Photos: [1:No Photos] [N/A:N/A] Wound Location: [1:Right Lower Leg] [N/A:N/A] Wounding Event: [1:Gradually Appeared] [N/A:N/A] Primary Etiology: [1:Diabetic Wound/Ulcer of the Lower Extremity] [N/A:N/A] Comorbid History: [1:Anemia, Arrhythmia, Coronary Artery Disease, Hypertension, Myocardial Infarction, Type II Diabetes, Osteoarthritis] [N/A:N/A] Date Acquired: [1:08/01/2014] [N/A:N/A] Weeks of Treatment: [1:0] [N/A:N/A] Wound Status: [1:Open] [N/A:N/A] Clustered Wound: [1:Yes] [N/A:N/A] Measurements L x W x D 2.2x2.6x0.1 [N/A:N/A] (cm) Area (cm) : [1:4.492] [N/A:N/A] Volume (cm) : [1:0.449] [N/A:N/A] % Reduction in Area: [1:0.00%] [N/A:N/A] % Reduction in Volume: 0.00% [N/A:N/A] Classification: [1:Grade 1] [N/A:N/A] Exudate Amount: [1:Small] [N/A:N/A] Exudate Type: [1:Serosanguineous] [N/A:N/A] Exudate Color: [1:red, brown] [N/A:N/A] Wound Margin: [1:Indistinct, nonvisible] [N/A:N/A] Granulation Amount: [1:Large (67-100%)] [N/A:N/A] Granulation Quality: [1:Red, Pink] [N/A:N/A] Necrotic Amount: [1:Small (1-33%)] [N/A:N/A] Necrotic Tissue: [1:Eschar, Adherent Slough] [N/A:N/A] Exposed Structures: [N/A:N/A] Fascia: No Fat: No Tendon: No Muscle: No Joint: No Bone: No Limited to Skin Breakdown Epithelialization: Small (1-33%) N/A N/A Periwound Skin Texture: Edema: Yes N/A N/A Excoriation: No Induration: No Callus: No Crepitus: No  Fluctuance: No Friable: No Rash: No Scarring: No Periwound Skin Moist: Yes N/A N/A Moisture: Dry/Scaly: Yes Maceration: No Periwound Skin Color: Erythema: Yes N/A N/A Atrophie Blanche: No Cyanosis: No Ecchymosis: No Hemosiderin Staining: No Mottled: No Pallor: No Rubor: No Temperature: No Abnormality N/A N/A Tenderness on No N/A N/A Palpation: Wound Preparation: Ulcer Cleansing: N/A N/A Rinsed/Irrigated with Saline Topical Anesthetic Applied: Other:  Lidocaine 4% Ointment Treatment Notes Electronic Signature(s) Signed: 08/22/2014 5:02:38 PM By: Montey Hora Entered By: Montey Hora on 08/22/2014 10:15:33 [name] [name] ([id number]) -------------------------------------------------------------------------------- Multi-Disciplinary Care Plan Details Patient Name: [name] [name] Date of Service: 08/22/2014 9:00 AM Medical Record Number: [id number] Patient Account Number: [id number] Date of Birth/Sex: 1940/04/22 (74 y.o. Male) Treating RN: Montey Hora Primary Care Physician: Fulton Reek Other Clinician: Referring Physician: Fulton Reek Treating Physician/Extender: Frann Rider in Treatment: 0 Active Inactive Nutrition Nursing Diagnoses: Potential for alteratiion in Nutrition/Potential for imbalanced nutrition Goals: Patient/caregiver agrees to and verbalizes understanding of need to use nutritional supplements and/or vitamins as prescribed Date Initiated: 08/22/2014 Goal Status: Active Interventions: Assess patient nutrition upon admission and as needed per policy Notes: Orientation to the Wound Care Program Nursing Diagnoses: Knowledge deficit related to the wound healing center program Goals: Patient/caregiver will verbalize understanding of the [place] Program Date Initiated: 08/22/2014 Goal Status: Active Interventions: Provide education on orientation to the wound center Notes: Wound/Skin Impairment Nursing Diagnoses: Impaired tissue integrity Goals: Ulcer/skin breakdown will have a volume reduction of 30% by week 4 [name] [name], [name] [name] ([id number]) Date Initiated: 08/22/2014 Goal Status: Active Interventions: Assess ulceration(s) every visit Notes: Electronic Signature(s) Signed: 08/22/2014 5:02:38 PM By: Montey Hora Entered By: Montey Hora on 08/22/2014 10:15:55 [name] [name] ([id number]) -------------------------------------------------------------------------------- Pain  Assessment Details Patient Name: [name] [name] Date of Service: 08/22/2014 9:00 AM Medical Record Number: [id number] Patient Account Number: [id number] Date of Birth/Sex: [birth year] (74 y.o. Male) Treating RN: Junious Dresser Primary Care Physician: Fulton Reek Other Clinician: Referring Physician: Fulton Reek Treating Physician/Extender: Frann Rider in Treatment: 0 Active Problems Location of Pain Severity and Description of Pain Patient Has Paino Yes Site Locations Pain Location: Generalized Pain, Pain in Ulcers With Dressing Change: No Duration of the Pain. Constant / Intermittento Constant Rate the pain. Current Pain Level: 1 Worst Pain Level: 6 Least Pain Level: 1 Character of Pain Describe the Pain: Burning Pain Management and Medication Current Pain Management: Notes pain is from neuropathy Electronic Signature(s) Signed: 08/22/2014 4:12:08 PM By: Junious Dresser RN Entered By: Junious Dresser on 08/22/2014 09:02:05 [name] [name] ([id number]) -------------------------------------------------------------------------------- Patient/Caregiver Education Details Patient Name: [name] [name] Date of Service: 08/22/2014 9:00 AM Medical Record Number: [id number] Patient Account Number: [id number] Date of Birth/Gender: Feb 19, 1941 (74 y.o. Male) Treating RN: Montey Hora Primary Care Physician: Fulton Reek Other Clinician: Referring Physician: Fulton Reek Treating Physician/Extender: Frann Rider in Treatment: 0 Education Assessment Education Provided To: Patient Education Topics Provided Venous: Handouts: Other: call if rewrap is needed Methods: Explain/Verbal Responses: State content correctly Electronic Signature(s) Signed: 08/22/2014 5:02:38 PM By: Montey Hora Entered By: Montey Hora on 08/22/2014 10:28:53 [name] [name] ([id number]) -------------------------------------------------------------------------------- Wound Assessment  Details Patient Name: [name] [name] Date of Service: 08/22/2014 9:00 AM Medical Record Number: [id number] Patient Account Number: [id number] Date of Birth/Sex: 08/06/1940 (74 y.o. Male) Treating RN: Junious Dresser Primary Care Physician: Fulton Reek Other Clinician: Referring Physician: Fulton Reek Treating Physician/Extender: Frann Rider in Treatment: 0 Wound Status Wound Number: 1 Primary Diabetic Wound/Ulcer of the Lower Etiology: Extremity Wound Location: Right Lower Leg Wound Open Wounding  Event: Gradually Appeared Status: Date Acquired: 08/01/2014 Comorbid Anemia, Arrhythmia, Coronary Artery Weeks Of Treatment: 0 History: Disease, Hypertension, Myocardial Clustered Wound: Yes Infarction, Type II Diabetes, Osteoarthritis Photos Photo Uploaded By: Gretta Cool, RN, BSN, Kim on 08/22/2014 14:36:04 Wound Measurements Length: (cm) 2.2 Width: (cm) 2.6 Depth: (cm) 0.1 Area: (cm) 4.492 Volume: (cm) 0.449 % Reduction in Area: 0% % Reduction in Volume: 0% Epithelialization: Small (1-33%) Tunneling: No Undermining: No Wound Description Classification: Grade 1 Wound Margin: Indistinct, nonvisible Exudate Amount: Small Exudate Type: Serosanguineous Exudate Color: red, brown Foul Odor After Cleansing: No Wound Bed Granulation Amount: Large (67-100%) Exposed Structure Granulation Quality: Red, Pink Fascia Exposed: No Necrotic Amount: Small (1-33%) Fat Layer Exposed: No [name] [name], [name] [name] ([id number]) Necrotic Quality: Eschar, Adherent Slough Tendon Exposed: No Muscle Exposed: No Joint Exposed: No Bone Exposed: No Limited to Skin Breakdown Periwound Skin Texture Texture Color No Abnormalities Noted: No No Abnormalities Noted: No Callus: No Atrophie Blanche: No Crepitus: No Cyanosis: No Excoriation: No Ecchymosis: No Fluctuance: No Erythema: Yes Friable: No Hemosiderin Staining: No Induration: No Mottled: No Localized Edema: Yes Pallor: No Rash:  No Rubor: No Scarring: No Temperature / Pain Moisture Temperature: No Abnormality No Abnormalities Noted: No Dry / Scaly: Yes Maceration: No Moist: Yes Wound Preparation Ulcer Cleansing: Rinsed/Irrigated with Saline Topical Anesthetic Applied: Other: Lidocaine 4% Ointment , Treatment Notes Wound #1 (Right Lower Leg) 1. Cleansed with: Clean wound with Normal Saline 2. Anesthetic Topical Lidocaine 4% cream to wound bed prior to debridement 4. Dressing Applied: Aquacel Ag 7. Secured with Rolena Infante to Right Lower Extremity Electronic Signature(s) Signed: 08/22/2014 4:12:08 PM By: Junious Dresser RN Entered By: Junious Dresser on 08/22/2014 09:11:10 [name] [name] ([id number]) -------------------------------------------------------------------------------- Wound Assessment Details Patient Name: [name] [name] Date of Service: 08/22/2014 9:00 AM Medical Record Number: [id number] Patient Account Number: [id number] Date of Birth/Sex: 02/11/1941 (74 y.o. Male) Treating RN: Montey Hora Primary Care Physician: Fulton Reek Other Clinician: Referring Physician: Fulton Reek Treating Physician/Extender: Frann Rider in Treatment: 0 Wound Status Wound Number: 2 Primary Diabetic Wound/Ulcer of the Lower Etiology: Extremity Wound Location: Right Lower Leg - Posterior, Proximal Wound Open Status: Wounding Event: Gradually Appeared Comorbid Anemia, Arrhythmia, Coronary Artery Date Acquired: 08/08/2014 History: Disease, Hypertension, Myocardial Weeks Of Treatment: 0 Infarction, Type II Diabetes, Clustered Wound: No Osteoarthritis, Neuropathy Photos Photo Uploaded By: Gretta Cool, RN, BSN, Kim on 08/22/2014 14:36:54 Wound Measurements Length: (cm) 1.9 Width: (cm) 1.2 Depth: (cm) 0.1 Area: (cm) 1.791 Volume: (cm) 0.179 % Reduction in Area: 0% % Reduction in Volume: 0% Epithelialization: Small (1-33%) Tunneling: No Undermining: No Wound Description Classification: Grade  1 Foul Odor Afte Wound Margin: Flat and Intact Exudate Amount: Small Exudate Type: Serous Exudate Color: amber r Cleansing: No Wound Bed Granulation Amount: Large (67-100%) Exposed Structure Granulation Quality: Red Fascia Exposed: No Necrotic Amount: None Present (0%) Fat Layer Exposed: No [name] [name], [name] [name] ([id number]) Tendon Exposed: No Muscle Exposed: No Joint Exposed: No Bone Exposed: No Limited to Skin Breakdown Periwound Skin Texture Texture Color No Abnormalities Noted: No No Abnormalities Noted: No Callus: No Atrophie Blanche: No Crepitus: No Cyanosis: No Excoriation: No Ecchymosis: No Fluctuance: No Erythema: No Friable: No Hemosiderin Staining: No Induration: No Mottled: No Localized Edema: No Pallor: No Rash: No Rubor: No Scarring: No Temperature / Pain Moisture Temperature: No Abnormality No Abnormalities Noted: No Dry / Scaly: No Maceration: No Moist: No Wound Preparation Ulcer Cleansing: Rinsed/Irrigated with Saline Topical Anesthetic Applied: None Treatment Notes Wound #2 (Right, Proximal, Posterior Lower Leg) 1. Cleansed with:  Clean wound with Normal Saline 2. Anesthetic Topical Lidocaine 4% cream to wound bed prior to debridement 4. Dressing Applied: Aquacel Ag 7. Secured with Rolena Infante to Right Lower Extremity Electronic Signature(s) Signed: 08/22/2014 5:02:38 PM By: Montey Hora Entered By: Montey Hora on 08/22/2014 10:25:15 [name] [name] ([id number]) -------------------------------------------------------------------------------- [place] Details Patient Name: [name] [name] Date of Service: 08/22/2014 9:00 AM Medical Record Number: [id number] Patient Account Number: [id number] Date of Birth/Sex: 09-Oct-1940 (74 y.o. Male) Treating RN: Junious Dresser Primary Care Physician: Fulton Reek Other Clinician: Referring Physician: Fulton Reek Treating Physician/Extender: Frann Rider in Treatment: 0 Vital Signs Time Taken:  09:00 Temperature (F): 98.0 Height (in): 72 Pulse (bpm): 82 Source: Stated Respiratory Rate (breaths/min): 16 Weight (lbs): 244.7 Blood Pressure (mmHg): 120/78 Source: Measured Reference Range: 80 - 120 mg / dl Body Mass Index (BMI): 33.2 Electronic Signature(s) Signed: 08/22/2014 4:12:08 PM By: Junious Dresser RN Entered By: Junious Dresser on 08/22/2014 09:02:36

## 2014-08-22 NOTE — Progress Notes (Signed)
TASEAN, MANCHA ([id number]) Visit Report for 08/22/2014 Abuse/Suicide Risk Screen Details Patient Name: SHANTA, DORVIL Date of Service: 08/22/2014 9:00 AM Medical Record Number: [id number] Patient Account Number: [id number] Date of Birth/Sex: August 03, 1940 (74 y.o. Male) Treating RN: Junious Dresser Primary Care Physician: Fulton Reek Other Clinician: Referring Physician: Fulton Reek Treating Physician/Extender: Frann Rider in Treatment: 0 Abuse/Suicide Risk Screen Items Answer ABUSE/SUICIDE RISK SCREEN: Has anyone close to you tried to hurt or harm you recentlyo No Do you feel uncomfortable with anyone in your familyo No Has anyone forced you do things that you didnot want to doo No Do you have any thoughts of harming yourselfo No Patient displays signs or symptoms of abuse and/or neglect. No Electronic Signature(s) Signed: 08/22/2014 4:12:08 PM By: Junious Dresser RN Entered By: Junious Dresser on 08/22/2014 09:27:11 Erline Hau ([id number]) -------------------------------------------------------------------------------- Activities of Daily Living Details Patient Name: Erline Hau Date of Service: 08/22/2014 9:00 AM Medical Record Number: [id number] Patient Account Number: [id number] Date of Birth/Sex: 1940/05/13 (74 y.o. Male) Treating RN: Junious Dresser Primary Care Physician: Fulton Reek Other Clinician: Referring Physician: Fulton Reek Treating Physician/Extender: Frann Rider in Treatment: 0 Activities of Daily Living Items Answer Activities of Daily Living (Please select one for each item) Drive Automobile Completely Able Take Medications Completely Able Use Telephone Completely Able Care for Appearance Completely Able Use Toilet Completely Able Bath / Shower Completely Able Dress Self Completely Able Feed Self Completely Able Walk Completely Able Get In / Out Bed Completely Able Housework Completely Able Prepare Meals Completely  Able Handle Money Completely Able Shop for Self Completely Able Electronic Signature(s) Signed: 08/22/2014 4:12:08 PM By: Junious Dresser RN Entered By: Junious Dresser on 08/22/2014 09:27:21 Erline Hau ([id number]) -------------------------------------------------------------------------------- Education Assessment Details Patient Name: Erline Hau Date of Service: 08/22/2014 9:00 AM Medical Record Number: [id number] Patient Account Number: [id number] Date of Birth/Sex: [birth year] (74 y.o. Male) Treating RN: Junious Dresser Primary Care Physician: Fulton Reek Other Clinician: Referring Physician: Fulton Reek Treating Physician/Extender: Frann Rider in Treatment: 0 Primary Learner Assessed: Patient Learning Preferences/Education Level/Primary Language Learning Preference: Explanation, Demonstration, Printed Material Highest Education Level: High School Preferred Language: English Cognitive Barrier Assessment/Beliefs Language Barrier: No Memory Deficit: No Emotional Barrier: No Cultural/Religious Beliefs Affecting Medical No Care: Physical Barrier Assessment Impaired Vision: Yes Glasses Impaired Hearing: Yes Hearing Aid, hearing loss (L) ear worse Decreased Hand dexterity: No Knowledge/Comprehension Assessment Knowledge Level: Medium Comprehension Level: Medium Ability to understand written Medium instructions: Ability to understand verbal Medium instructions: Motivation Assessment Anxiety Level: Calm Cooperation: Cooperative Education Importance: Acknowledges Need Interest in Health Problems: Asks Questions Perception: Coherent Willingness to Engage in Self- Medium Management Activities: Readiness to Engage in Self- Medium Management Activities: Electronic Signature(s) Signed: 08/22/2014 4:12:08 PM By: Junious Dresser RN Jeanette Caprice, Juanda Crumble ([id number]) Entered By: Junious Dresser on 08/22/2014 09:28:25 Erline Hau  ([id number]) -------------------------------------------------------------------------------- Fall Risk Assessment Details Patient Name: Erline Hau Date of Service: 08/22/2014 9:00 AM Medical Record Number: [id number] Patient Account Number: [id number] Date of Birth/Sex: [birth year] (74 y.o. Male) Treating RN: Junious Dresser Primary Care Physician: Fulton Reek Other Clinician: Referring Physician: Fulton Reek Treating Physician/Extender: Frann Rider in Treatment: 0 Fall Risk Assessment Items FALL RISK ASSESSMENT: History of falling - immediate or within 3 months 0 No Secondary diagnosis 0 No Ambulatory aid None/bed rest/wheelchair/nurse 0 Yes Crutches/cane/walker 0 No Furniture 0 No IV Access/Saline Lock 0 No Gait/Training Normal/bed rest/immobile 0 Yes Weak 0 No Impaired 0 No Mental Status Oriented to own ability 0 Yes Electronic Signature(s) Signed: 08/22/2014  4:12:08 PM By: Junious Dresser RN Entered By: Junious Dresser on 08/22/2014 09:28:41 Erline Hau ([id number]) -------------------------------------------------------------------------------- Foot Assessment Details Patient Name: Erline Hau Date of Service: 08/22/2014 9:00 AM Medical Record Number: [id number] Patient Account Number: [id number] Date of Birth/Sex: 1940/04/18 (74 y.o. Male) Treating RN: Junious Dresser Primary Care Physician: Fulton Reek Other Clinician: Referring Physician: Fulton Reek Treating Physician/Extender: Frann Rider in Treatment: 0 Foot Assessment Items Site Locations + = Sensation present, - = Sensation absent, C = Callus, U = Ulcer R = Redness, W = Warmth, M = Maceration, PU = Pre-ulcerative lesion F = Fissure, S = Swelling, D = Dryness Assessment Right: Left: Other Deformity: No No Prior Foot Ulcer: No No Prior Amputation: No No Charcot Joint: No No Ambulatory Status: Ambulatory Without Help Gait: Steady Electronic Signature(s) Signed: 08/22/2014  4:12:08 PM By: Junious Dresser RN Entered By: Junious Dresser on 08/22/2014 09:32:35 Erline Hau ([id number]) -------------------------------------------------------------------------------- Nutrition Risk Assessment Details Patient Name: Erline Hau Date of Service: 08/22/2014 9:00 AM Medical Record Number: [id number] Patient Account Number: [id number] Date of Birth/Sex: December 28, 1940 (74 y.o. Male) Treating RN: Junious Dresser Primary Care Physician: Fulton Reek Other Clinician: Referring Physician: Fulton Reek Treating Physician/Extender: Frann Rider in Treatment: 0 Height (in): 72 Weight (lbs): 244.7 Body Mass Index (BMI): 33.2 Nutrition Risk Assessment Items NUTRITION RISK SCREEN: I have an illness or condition that made me change the kind and/or 0 No amount of food I eat I eat fewer than two meals per day 0 No I eat few fruits and vegetables, or milk products 0 No I have three or more drinks of beer, liquor or wine almost every day 0 No I have tooth or mouth problems that make it hard for me to eat 0 No I don't always have enough money to buy the food I need 0 No I eat alone most of the time 1 Yes I take three or more different prescribed or over-the-counter drugs a 1 Yes day Without wanting to, I have lost or gained 10 pounds in the last six 2 Yes months I am not always physically able to shop, cook and/or feed myself 0 No Nutrition Protocols Good Risk Protocol Provide education on elevated blood sugars Moderate Risk Protocol 0 and impact on wound healing, as applicable Electronic Signature(s) Signed: 08/22/2014 4:12:08 PM By: Junious Dresser RN Entered By: Junious Dresser on 08/22/2014 09:29:18

## 2014-08-29 ENCOUNTER — Encounter: Payer: Medicare Other | Admitting: Surgery

## 2014-08-29 DIAGNOSIS — L97211 Non-pressure chronic ulcer of right calf limited to breakdown of skin: Secondary | ICD-10-CM | POA: Diagnosis not present

## 2014-08-30 NOTE — Progress Notes (Signed)
[name] [name] ([id number]) Visit Report for 08/29/2014 Arrival Information Details Patient Name: [name], [name] Date of Service: 08/29/2014 10:15 AM Medical Record Number: [id number] Patient Account Number: [id number] Date of Birth/Sex: 03/01/1941 (74 y.o. Male) Treating RN: Montey Hora Primary Care Physician: Fulton Reek Other Clinician: Referring Physician: Fulton Reek Treating Physician/Extender: Frann Rider in Treatment: 1 Visit Information History Since Last Visit Added or deleted any medications: No Patient Arrived: Ambulatory Any new allergies or adverse reactions: No Arrival Time: 10:17 Had a fall or experienced change in No Accompanied By: self activities of daily living that may affect Transfer Assistance: None risk of falls: Patient Identification Verified: Yes Signs or symptoms of abuse/neglect since last No Secondary Verification Process Yes visito Completed: Hospitalized since last visit: No Patient Has Alerts: Yes Pain Present Now: No Patient Alerts: Patient on Blood Thinner DM II ASA 81 mg 6/15 ABI L-0.98 R-0.95 Electronic Signature(s) Signed: 08/29/2014 5:36:15 PM By: Montey Hora Entered By: Montey Hora on 08/29/2014 10:18:20 [name] [name] ([id number]) -------------------------------------------------------------------------------- Clinic Level of Care Assessment Details Patient Name: [name] [name] Date of Service: 08/29/2014 10:15 AM Medical Record Number: [id number] Patient Account Number: [id number] Date of Birth/Sex: 02-17-1941 (73 y.o. Male) Treating RN: Montey Hora Primary Care Physician: Fulton Reek Other Clinician: Referring Physician: Fulton Reek Treating Physician/Extender: Frann Rider in Treatment: 1 Clinic Level of Care Assessment Items TOOL 4 Quantity Score []  - Use when only an EandM is performed on FOLLOW-UP visit 0 ASSESSMENTS - Nursing Assessment / Reassessment X - Reassessment of  Co-morbidities (includes updates in patient status) 1 10 X - Reassessment of Adherence to Treatment Plan 1 5 ASSESSMENTS - Wound and Skin Assessment / Reassessment []  - Simple Wound Assessment / Reassessment - one wound 0 X - Complex Wound Assessment / Reassessment - multiple wounds 1 5 []  - Dermatologic / Skin Assessment (not related to wound area) 0 ASSESSMENTS - Focused Assessment X - Circumferential Edema Measurements - multi extremities 1 5 []  - Nutritional Assessment / Counseling / Intervention 0 X - Lower Extremity Assessment (monofilament, tuning fork, pulses) 1 5 []  - Peripheral Arterial Disease Assessment (using hand held doppler) 0 ASSESSMENTS - Ostomy and/or Continence Assessment and Care []  - Incontinence Assessment and Management 0 []  - Ostomy Care Assessment and Management (repouching, etc.) 0 PROCESS - Coordination of Care X - Simple Patient / Family Education for ongoing care 1 15 []  - Complex (extensive) Patient / Family Education for ongoing care 0 []  - Staff obtains Programmer, systems, Records, Test Results / Process Orders 0 []  - Staff telephones HHA, Nursing Homes / Clarify orders / etc 0 []  - Routine Transfer to another Facility (non-emergent condition) 0 [name] [name], [name] [name] ([id number]) []  - Routine Hospital Admission (non-emergent condition) 0 []  - New Admissions / Biomedical engineer / Ordering NPWT, Apligraf, etc. 0 []  - Emergency Hospital Admission (emergent condition) 0 X - Simple Discharge Coordination 1 10 []  - Complex (extensive) Discharge Coordination 0 PROCESS - Special Needs []  - Pediatric / Minor Patient Management 0 []  - Isolation Patient Management 0 []  - Hearing / Language / Visual special needs 0 []  - Assessment of Community assistance (transportation, D/C planning, etc.) 0 []  - Additional assistance / Altered mentation 0 []  - Support Surface(s) Assessment (bed, cushion, seat, etc.) 0 INTERVENTIONS - Wound Cleansing / Measurement []  - Simple Wound  Cleansing - one wound 0 X - Complex Wound Cleansing - multiple wounds 2 5 []  - Wound Imaging (photographs - any number of wounds) 0 X - Wound Tracing (  instead of photographs) 1 5 []  - Simple Wound Measurement - one wound 0 []  - Complex Wound Measurement - multiple wounds 0 INTERVENTIONS - Wound Dressings []  - Small Wound Dressing one or multiple wounds 0 []  - Medium Wound Dressing one or multiple wounds 0 []  - Large Wound Dressing one or multiple wounds 0 []  - Application of Medications - topical 0 []  - Application of Medications - injection 0 INTERVENTIONS - Miscellaneous []  - External ear exam 0 [name] [name], [name] [name] ([id number]) []  - Specimen Collection (cultures, biopsies, blood, body fluids, etc.) 0 []  - Specimen(s) / Culture(s) sent or taken to Lab for analysis 0 []  - Patient Transfer (multiple staff / Harrel Lemon Lift / Similar devices) 0 []  - Simple Staple / Suture removal (25 or less) 0 []  - Complex Staple / Suture removal (26 or more) 0 []  - Hypo / Hyperglycemic Management (close monitor of Blood Glucose) 0 []  - Ankle / Brachial Index (ABI) - do not check if billed separately 0 X - Vital Signs 1 5 Has the patient been seen at the hospital within the last three years: Yes Total Score: 75 Level Of Care: New/Established - Level 2 Electronic Signature(s) Signed: 08/29/2014 5:36:15 PM By: Montey Hora Entered By: Montey Hora on 08/29/2014 10:46:08 [name] [name] ([id number]) -------------------------------------------------------------------------------- Encounter Discharge Information Details Patient Name: [name] [name] Date of Service: 08/29/2014 10:15 AM Medical Record Number: [id number] Patient Account Number: [id number] Date of Birth/Sex: [birth year] (74 y.o. Male) Treating RN: Montey Hora Primary Care Physician: Fulton Reek Other Clinician: Referring Physician: Fulton Reek Treating Physician/Extender: Frann Rider in Treatment: 1 Encounter Discharge  Information Items Discharge Pain Level: 0 Discharge Condition: Stable Ambulatory Status: Ambulatory Discharge Destination: Home Transportation: Private Auto Accompanied By: self Schedule Follow-up Appointment: Yes Medication Reconciliation completed and provided to Patient/Care No [name] [name]: Provided on Clinical Summary of Care: 08/29/2014 Form Type Recipient Paper Patient [place] Electronic Signature(s) Signed: 08/29/2014 11:06:03 AM By: Ruthine Dose Previous Signature: 08/29/2014 11:04:44 AM Version By: Ruthine Dose Previous Signature: 08/29/2014 10:52:32 AM Version By: Ruthine Dose Entered By: Ruthine Dose on 08/29/2014 11:06:03 [name] [name] ([id number]) -------------------------------------------------------------------------------- Lower Extremity Assessment Details Patient Name: [name] [name] Date of Service: 08/29/2014 10:15 AM Medical Record Number: [id number] Patient Account Number: [id number] Date of Birth/Sex: 03-Jun-1940 (74 y.o. Male) Treating RN: Montey Hora Primary Care Physician: Fulton Reek Other Clinician: Referring Physician: Fulton Reek Treating Physician/Extender: Frann Rider in Treatment: 1 Edema Assessment Assessed: [Left: No] [Right: No] E[Left: dema] [Right: :] Calf Left: Right: Point of Measurement: 36 cm From Medial Instep cm 38 cm Ankle Left: Right: Point of Measurement: 12 cm From Medial Instep cm 25 cm Vascular Assessment Pulses: Posterior Tibial Palpable: [Right:Yes] Dorsalis Pedis Palpable: [Right:Yes] Extremity colors, hair growth, and conditions: Extremity Color: [Right:Hyperpigmented] Hair Growth on Extremity: [Right:Yes] Temperature of Extremity: [Right:Warm] Capillary Refill: [Right:< 3 seconds] Toe Nail Assessment Left: Right: Thick: Yes Discolored: Yes Deformed: Yes Improper Length and Hygiene: No Electronic Signature(s) Signed: 08/29/2014 5:36:15 PM By: Montey Hora Entered By: Montey Hora on  08/29/2014 10:30:42 [name] [name] ([id number]) [name] [name] ([id number]) -------------------------------------------------------------------------------- Multi Wound Chart Details Patient Name: [name] [name] Date of Service: 08/29/2014 10:15 AM Medical Record Number: [id number] Patient Account Number: [id number] Date of Birth/Sex: [birth year] (74 y.o. 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] [N/A:N/A] Comorbid History: [1:N/A] [2:Anemia, Arrhythmia, Coronary Artery Disease, Hypertension, Myocardial Infarction, Type II Diabetes, Osteoarthritis, Neuropathy] [N/A:N/A] Date Acquired: [1:08/01/2014] [2:08/08/2014] [N/A:N/A] Weeks of Treatment: [1:1] [2:1] [N/A:N/A] Wound Status: [1:Healed - Epithelialized] [2:Open] [N/A:N/A] Clustered Wound: [1:Yes] [2:No] [N/A:N/A] Measurements L x W x D 0x0x0 [2:1.5x0.6x0.1] [N/A:N/A] (cm) Area (cm) : [1:0] [2:0.707] [N/A:N/A] Volume (cm) : [1:0] [2:0.071] [N/A:N/A] % Reduction in Area: [1:100.00%] [2:60.50%] [N/A:N/A] % Reduction in Volume: 100.00% [2:60.30%] [N/A:N/A] Classification: [1:Grade 1] [2:Grade 1] [N/A:N/A] Exudate Amount: [1:N/A] [2:Small] [N/A:N/A] Exudate Type: [1:N/A] [2:Serous] [N/A:N/A] Exudate Color: [1:N/A] [2:amber] [N/A:N/A] Wound Margin: [1:N/A] [2:Flat and Intact]  [N/A:N/A] Granulation Amount: [1:N/A] [2:Small (1-33%)] [N/A:N/A] Granulation Quality: [1:N/A] [2:Red] [N/A:N/A] Necrotic Amount: [1:N/A] [2:Medium (34-66%)] [N/A:N/A] Necrotic Tissue: N/A Eschar N/A Epithelialization: N/A Small (1-33%) N/A Periwound Skin Texture: No Abnormalities Noted Edema: No N/A Excoriation: No Induration: No Callus: No Crepitus: No Fluctuance: No Friable: No Rash: No Scarring: No Periwound Skin No Abnormalities Noted Maceration: No N/A Moisture: Moist: No Dry/Scaly: No Periwound Skin Color: No Abnormalities Noted Atrophie Blanche: No N/A Cyanosis: No Ecchymosis: No Erythema: No Hemosiderin Staining: No Mottled: No Pallor: No Rubor: No Temperature: N/A No Abnormality N/A Tenderness on No No N/A Palpation: Wound Preparation: N/A Ulcer Cleansing: N/A Rinsed/Irrigated with Saline Topical Anesthetic Applied: Other: lidocaine 4% Treatment Notes Electronic Signature(s) Signed: 08/29/2014 5:36:15 PM By: Montey Hora Entered By: Montey Hora on 08/29/2014 10:38:33 [name] [name] ([id number]) -------------------------------------------------------------------------------- Multi-Disciplinary Care Plan Details Patient Name: [name] [name] Date of Service: 08/29/2014 10:15 AM Medical Record Number: [id number] Patient Account Number: [id number] Date of Birth/Sex: 22-Nov-1940 (74 y.o. Male) Treating RN: Montey Hora Primary Care Physician: Fulton Reek Other Clinician: Referring Physician: Fulton Reek Treating Physician/Extender: Frann Rider in Treatment: 1 Active Inactive Electronic Signature(s) Signed: 08/29/2014 5:36:15 PM By: Montey Hora Entered By: Montey Hora on 08/29/2014 10:44:57 [name] [name] ([id number]) -------------------------------------------------------------------------------- Patient/Caregiver Education Details Patient Name: [name] [name] Date of Service: 08/29/2014 10:15 AM Medical Record Number:  [id number] Patient Account Number: [id number] Date of Birth/Gender: 23-Nov-1940 (74 y.o. Male) Treating RN: Montey Hora Primary Care Physician: Fulton Reek Other Clinician: Referring Physician: Fulton Reek Treating Physician/Extender: Frann Rider in Treatment: 1 Education Assessment Education Provided To: Patient Education Topics Provided Venous: Handouts: Other: f/u with [facility] and continue wearing compression garments daily Methods: Explain/Verbal Responses: State content correctly Electronic Signature(s) Signed: 08/29/2014 5:36:15 PM By: Montey Hora Entered By: Montey Hora on 08/29/2014 10:44:07 [name] [name] ([id number]) -------------------------------------------------------------------------------- Wound Assessment Details Patient Name: [name] [name] Date of Service: 08/29/2014 10:15 AM Medical Record Number: [id number] Patient Account Number: [id number] Date of Birth/Sex: 08/09/1940 (74 y.o. Male) Treating RN: Montey Hora Primary Care Physician: Fulton Reek Other Clinician: Referring Physician: Fulton Reek Treating Physician/Extender: Frann Rider in Treatment: 1 Wound Status Wound Number: 1 Primary Diabetic Wound/Ulcer of the Lower Etiology: Extremity Wound Location: Right Lower Leg Wound Status: Healed - Epithelialized Wounding Event: Gradually Appeared Date Acquired: 08/01/2014 Weeks Of Treatment: 1 Clustered Wound: Yes Photos Photo Uploaded By: Montey Hora on 08/29/2014 13:19:03 Wound Measurements Length: (cm) 0 % Reduction in Width: (cm) 0 % Reduction in Depth: (cm) 0 Area: (cm) 0 Volume: (cm) 0 Area: 100% Volume: 100% Wound Description Classification: Grade 1 Periwound Skin Texture Texture Color No Abnormalities Noted: No No Abnormalities Noted: No Moisture No  Abnormalities Noted: No Electronic Signature(s) Signed: 08/29/2014 5:36:15 PM By: Velna Ochs, Juanda Crumble ([id number]) Entered By: Montey Hora on 08/29/2014 10:37:55 [name] [name] ([id number]) -------------------------------------------------------------------------------- Wound Assessment Details Patient Name: [name] [name] Date of Service: 08/29/2014 10:15 AM Medical Record Number: [id number] Patient Account Number: [id number] Date of Birth/Sex: April 29, 1940 (74 y.o. Male) Treating RN: Montey Hora Primary Care Physician: Fulton Reek Other Clinician: Referring Physician: Fulton Reek Treating Physician/Extender: Frann Rider in Treatment: 1 Wound Status Wound Number: 2 Primary Diabetic Wound/Ulcer of the Lower Etiology: Extremity Wound Location: Right, Proximal, Posterior Lower Leg Wound Healed - Epithelialized Status: Wounding Event: Gradually Appeared Comorbid Anemia, Arrhythmia, Coronary Artery Date Acquired: 08/08/2014 History: Disease, Hypertension, Myocardial Weeks Of Treatment: 1 Infarction, Type II Diabetes, Clustered Wound: No Osteoarthritis, Neuropathy Photos Photo Uploaded By: Montey Hora on 08/29/2014 13:19:04 Wound Measurements Length: (cm) 0 % Reduction Width: (cm) 0 % Reduction Depth: (cm) 0 Epithelializ Area: (cm) 0 Tunneling: Volume: (cm) 0 Undermining in Area: 100% in Volume: 100% ation: Small (1-33%) No : No Wound Description Classification: Grade 1 Wound Margin: Flat and Intact Exudate Amount: Small Exudate Type: Serous Exudate Color: amber Foul Odor After Cleansing: No Wound Bed Granulation Amount: Small (1-33%) Exposed Structure Granulation Quality: Red Fascia Exposed: No Necrotic Amount: Medium (34-66%) Fat Layer Exposed: No [name] [name], [name] [name] ([id number]) Necrotic Quality: Eschar Tendon Exposed: No Muscle Exposed: No Joint Exposed: No Bone Exposed: No Limited to Skin Breakdown Periwound Skin Texture Texture Color No Abnormalities Noted: No No Abnormalities Noted: No Callus: No Atrophie Blanche: No Crepitus: No Cyanosis: No Excoriation:  No Ecchymosis: No Fluctuance: No Erythema: No Friable: No Hemosiderin Staining: No Induration: No Mottled: No Localized Edema: No Pallor: No Rash: No Rubor: No Scarring: No Temperature / Pain Moisture Temperature: No Abnormality No Abnormalities Noted: No Dry / Scaly: No Maceration: No Moist: No Wound Preparation Ulcer Cleansing: Rinsed/Irrigated with Saline Topical Anesthetic Applied: Other: lidocaine 4%, Electronic Signature(s) Signed: 08/29/2014 5:36:15 PM By: Montey Hora Entered By: Montey Hora on 08/29/2014 10:44:42 [name] [name] ([id number]) -------------------------------------------------------------------------------- Vitals Details Patient Name: [name] [name] Date of Service: 08/29/2014 10:15 AM Medical Record Number: [id number] Patient Account Number: [id number] Date of Birth/Sex: 05-06-1940 (74 y.o. Male) Treating RN: Montey Hora Primary Care Physician: Fulton Reek Other Clinician: Referring Physician: Fulton Reek Treating Physician/Extender: Frann Rider in Treatment: 1 Vital Signs Time Taken: 10:18 Temperature (F): 98.2 Height (in): 72 Pulse (bpm): 86 Weight (lbs): 244.7 Respiratory Rate (breaths/min): 18 Body Mass Index (BMI): 33.2 Blood Pressure (mmHg): 121/71 Reference Range: 80 - 120 mg / dl Electronic Signature(s) Signed: 08/29/2014 5:36:15 PM By: Montey Hora Entered By: Montey Hora on 08/29/2014 10:20:42

## 2014-08-30 NOTE — Progress Notes (Signed)
DSEAN, VANTOL ([id number]) Visit Report for 08/29/2014 Chief Complaint Document Details Patient Name: [name] [name], [name] [name] Date of Service: 08/29/2014 10:15 AM Medical Record Number: [id number] Patient Account Number: [id number] Date of Birth/Sex: 09-Aug-1940 (74 y.o. Male) Treating RN: Primary Care Physician: Fulton Reek Other Clinician: Referring Physician: Fulton Reek Treating Physician/Extender: Frann Rider in Treatment: 1 Information Obtained from: Patient Chief Complaint Patient presents to the wound care center for a consult due non healing wound. Swelling and ulceration of the right lower extremity for about a month. Electronic Signature(s) Signed: 08/29/2014 12:13:05 PM By: Christin Fudge MD, FACS Entered By: Christin Fudge on 08/29/2014 10:46:40 [name] [name] ([id number]) -------------------------------------------------------------------------------- HPI Details Patient Name: [name] [name] Date of Service: 08/29/2014 10:15 AM Medical Record Number: [id number] Patient Account Number: [id number] Date of Birth/Sex: [birth year] (74 y.o. Male) Treating RN: Primary Care Physician: Fulton Reek Other Clinician: Referring Physician: Fulton Reek Treating Physician/Extender: Frann Rider in Treatment: 1 History of Present Illness Location: right lower extremity Quality: Patient reports No Pain. Severity: Patient states wound are getting worse. Duration: Patient has had the wound for > 1 months prior to seeking treatment at the wound center Context: The wound appeared gradually over time Modifying Factors: Other treatment(s) tried include: local application of Neosporin ointment. Associated Signs and Symptoms: Patient reports having difficulty standing for long periods. HPI Description: 74 year old gentleman who is known to have diabetes mellitus type 2, arthritis, arrhythmia, hypertension, coronary artery disease, history of PTCA, vein surgery, status post left  and right total knee replacement, back surgery, previous surgery for varicose veins, comes with ulceration on his left lower extremity which has been there for over a month. He does say his diabetes control is fairly good and his hemoglobin A1c done about 2 weeks ago was normal as per his doctor. About 4-5 years ago he saw the surgeons at the [facility] and he did have a endovenous procedure done but does not have any details and was told to wear compression stockings after this. He has not been wearing his compression stockings for a while and seldom wears them. 08/29/2014 -- tolerate his own Unna's boots very well and has done extremely well over the week. He has his vascular appointment tomorrow and will be getting a opinion from the vascular surgeon soon. Electronic Signature(s) Signed: 08/29/2014 12:13:05 PM By: Christin Fudge MD, FACS Entered By: Christin Fudge on 08/29/2014 10:47:20 [name] [name] ([id number]) -------------------------------------------------------------------------------- Physical Exam Details Patient Name: [name] [name] Date of Service: 08/29/2014 10:15 AM Medical Record Number: [id number] Patient Account Number: [id number] Date of Birth/Sex: 08/09/1940 (74 y.o. Male) Treating RN: Primary Care Physician: Fulton Reek Other Clinician: Referring Physician: Fulton Reek Treating Physician/Extender: Frann Rider in Treatment: 1 Constitutional . Pulse regular. Respirations normal and unlabored. Afebrile. . Eyes Nonicteric. Reactive to light. Ears, Nose, Mouth, and Throat Lips, teeth, and gums WNL.[name] Moist mucosa without lesions . Neck supple and nontender. No palpable supraclavicular or cervical adenopathy. Normal sized without goiter. Respiratory WNL. No retractions.. Cardiovascular Pedal Pulses WNL. No clubbing, cyanosis or edema. Chest Breasts symmetical and no nipple discharge.. Breast tissue WNL, no masses, lumps, or  tenderness.. Musculoskeletal Adexa without tenderness or enlargement.. Digits and nails w/o clubbing, cyanosis, infection, petechiae, ischemia, or inflammatory conditions.. Integumentary (Hair, Skin) No suspicious lesions. his wound on the right lower extremity has completely healed.. No crepitus or fluctuance. No peri-wound warmth or erythema. No masses.[name] Psychiatric Judgement and insight Intact.. No evidence of depression, anxiety, or agitation.. Electronic Signature(s) Signed: 08/29/2014 12:13:05  PM By: Christin Fudge MD, FACS Entered By: Christin Fudge on 08/29/2014 10:47:51 [name] [name] ([id number]) -------------------------------------------------------------------------------- Physician Orders Details Patient Name: [name] [name] Date of Service: 08/29/2014 10:15 AM Medical Record Number: [id number] Patient Account Number: [id number] Date of Birth/Sex: 1940-12-01 (74 y.o. Male) Treating RN: Montey Hora Primary Care Physician: Fulton Reek Other Clinician: Referring Physician: Fulton Reek Treating Physician/Extender: Frann Rider in Treatment: 1 Verbal / Phone Orders: Yes Clinician: Montey Hora Read Back and Verified: Yes Diagnosis Coding [facility] o Discharge from [place] - f/u with [facility] and continue wearing compression garments daily Electronic Signature(s) Signed: 08/29/2014 12:13:05 PM By: Christin Fudge MD, FACS Signed: 08/29/2014 5:36:15 PM By: Montey Hora Entered By: Montey Hora on 08/29/2014 10:45:30 [name] [name] ([id number]) -------------------------------------------------------------------------------- Problem List Details Patient Name: [name] [name] Date of Service: 08/29/2014 10:15 AM Medical Record Number: [id number] Patient Account Number: [id number] Date of Birth/Sex: 1940-08-24 (74 y.o. 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 Male) Treating RN: Primary Care Physician: Fulton Reek Other Clinician: Referring Physician: Fulton Reek Treating Physician/Extender: Frann Rider in Treatment: 1 Subjective Chief Complaint Information obtained from Patient Patient presents to the wound care center for a consult due non healing wound. Swelling and ulceration of the right lower extremity for about a month. History of Present Illness (HPI) The following HPI elements were documented for the patient's wound: Location: right lower extremity Quality: Patient reports No Pain. Severity: Patient states wound are getting worse. Duration: Patient has had the wound for > 1 months prior to seeking treatment at the wound center Context: The wound appeared gradually over time Modifying Factors: Other treatment(s) tried include: local application of Neosporin ointment. Associated Signs and Symptoms: Patient reports having difficulty standing for long periods. 74 year old gentleman who is known to have diabetes mellitus type 2, arthritis, arrhythmia,  hypertension, coronary artery disease, history of PTCA, vein surgery, status post left and right total knee replacement, back surgery, previous surgery for varicose veins, comes with ulceration on his left lower extremity which has been there for over a month. He does say his diabetes control is fairly good and his hemoglobin A1c done about 2 weeks ago was normal as per his doctor. About 4-5 years ago he saw the surgeons at the [facility] and he did have a endovenous procedure done but does not have any details and was told to wear compression stockings after this. He has not been wearing his compression stockings for a while and seldom wears them. 08/29/2014 -- tolerate his own Unna's boots very well and has done extremely well over the week. He has his vascular appointment tomorrow and will be getting a opinion from the vascular surgeon soon. Objective Constitutional Pulse regular. Respirations normal and unlabored. Afebrile. [name] [name], [name] [name] ([id number]) Vitals Time Taken: 10:18 AM, Height: 72  in, Weight: 244.7 lbs, BMI: 33.2, Temperature: 98.2 F, Pulse: 86 bpm, Respiratory Rate: 18 breaths/min, Blood Pressure: 121/71 mmHg. Eyes Nonicteric. Reactive to light. Ears, Nose, Mouth, and Throat Lips, teeth, and gums WNL.[name] Moist mucosa without lesions . Neck supple and nontender. No palpable supraclavicular or cervical adenopathy. Normal sized without goiter. Respiratory WNL. No retractions.. Cardiovascular Pedal Pulses WNL. No clubbing, cyanosis or edema. Chest Breasts symmetical and no nipple discharge.. Breast tissue WNL, no masses, lumps, or tenderness.. Musculoskeletal Adexa without tenderness or enlargement.. Digits and nails w/o clubbing, cyanosis, infection, petechiae, ischemia, or inflammatory conditions.[name] Psychiatric Judgement and insight Intact.. No evidence of depression, anxiety, or agitation.. Integumentary (Hair, Skin) No suspicious lesions. his  wound on the right lower extremity has completely healed.. No crepitus or fluctuance. No peri-wound warmth or erythema. No masses.. Wound #1 status is Healed - Epithelialized. Original cause of wound was Gradually Appeared. The wound is located on the Right Lower Leg. The wound measures 0cm length x 0cm width x 0cm depth; 0cm^2 area and 0cm^3 volume. Wound #2 status is Healed - Epithelialized. Original cause of wound was Gradually Appeared. The wound is located on the Right,Proximal,Posterior Lower Leg. The wound measures 0cm length x 0cm width x 0cm depth; 0cm^2 area and 0cm^3 volume. The wound is limited to skin breakdown. There is no tunneling or undermining noted. There is a small amount of serous drainage noted. The wound margin is flat and intact. There is small (1-33%) red granulation within the wound bed. There is a medium (34-66%) amount of necrotic tissue within the wound bed including Eschar. The periwound skin appearance did not exhibit: Callus, Crepitus, Excoriation, Fluctuance, Friable, Induration, Localized Edema, Rash, Scarring, Dry/Scaly, Maceration, Moist, Atrophie Blanche, Cyanosis, Ecchymosis, Hemosiderin Staining, Mottled, Pallor, Rubor, Erythema. Periwound temperature was noted as No Abnormality. MIRL, HILLERY ([id number]) Assessment Active Problems ICD-10 E11.622 - Type 2 diabetes mellitus with other skin ulcer L97.211 - Non-pressure chronic ulcer of right calf limited to breakdown of skin I87.311 - Chronic venous hypertension (idiopathic) with ulcer of right lower extremity He has done remarkably well with the local treatment we gave him and his compression with the Unna's boot. His wounds have completely healed and I have asked him to wear his compression stockings which he has with him. He will see the vascular surgeons and their group tomorrow and I have discharged him from our services and asked him to come back and see as required if he has any  problems. Plan Discharge From [facility]: Discharge from [place] - f/u with [facility] and continue wearing compression garments daily He has done remarkably well with the local treatment we gave him and his compression with the Unna's boot. His wounds have completely healed and I have asked him to wear his compression stockings which he has with him. He will see the vascular surgeons and their group tomorrow and I have discharged him from our services and asked him to come back and see as required if he has any problems. Electronic Signature(s) Signed: 08/29/2014 12:13:05 PM By: Christin Fudge MD, FACS Entered By: Christin Fudge on 08/29/2014 10:50:25 [name] [name] ([id number]) -------------------------------------------------------------------------------- SuperBill Details Patient Name: [name] [name] Date of Service: 08/29/2014 Medical Record Number: [id number] Patient Account Number: [id number] Date of Birth/Sex: 11-20-1940 (74 y.o. 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 Facility Procedures CPT4 Code: [id number] Description: [phone] - WOUND CARE VISIT-LEV 2 EST PT Modifier: Quantity: 1 Physician Procedures CPT4 Code Description: [id number] - WC PHYS LEVEL 3 - EST PT ICD-10 Description Diagnosis E11.622 Type 2 diabetes mellitus with other skin ulcer L97.211 Non-pressure chronic ulcer of right calf limited to I87.311 Chronic venous hypertension  (idiopathic) with ulcer Modifier: breakdown of s of right lower Quantity: 1 kin extremity Electronic Signature(s) Signed: 08/29/2014  12:13:05 PM By: Christin Fudge MD, FACS Entered By: Christin Fudge on 08/29/2014 10:50:40

## 2015-06-06 ENCOUNTER — Encounter: Payer: Medicare Other | Attending: Internal Medicine | Admitting: Dietician

## 2015-06-06 ENCOUNTER — Encounter: Payer: Self-pay | Admitting: Dietician

## 2015-06-06 VITALS — Ht 72.0 in | Wt 250.1 lb

## 2015-06-06 DIAGNOSIS — E119 Type 2 diabetes mellitus without complications: Secondary | ICD-10-CM | POA: Insufficient documentation

## 2015-06-06 DIAGNOSIS — E1159 Type 2 diabetes mellitus with other circulatory complications: Secondary | ICD-10-CM

## 2015-06-06 NOTE — Addendum Note (Signed)
Addended by: Derry Lory on: 06/06/2015 01:17 PM   Modules accepted: Medications

## 2015-06-06 NOTE — Progress Notes (Signed)
Diabetes Self-Management Education  Visit Type: First/Initial  Appt. Start Time: 0930 Appt. End Time: 1030  06/06/2015  [name], identified by name and date of birth, is a 75 y.o. male with a diagnosis of Diabetes: Type 2.   ASSESSMENT  Height 6' (1.829 m), weight 250 lb 1.6 oz (113.445 kg). Body mass index is 33.91 kg/(m^2). Recent A1C 8.6 %     Diabetes Self-Management Education - 06/06/15 1231    Visit Information   Visit Type First/Initial   Initial Visit   Diabetes Type Type 2   Health Coping   How would you rate your overall health? Fair   Psychosocial Assessment   Patient Belief/Attitude about Diabetes Motivated to manage diabetes   Self-care barriers None   Patient Concerns Nutrition/Meal planning;Medication;Monitoring;Healthy Lifestyle;Problem Solving;Glycemic Control;Weight Control   Special Needs None   Preferred Learning Style Auditory;Visual   Learning Readiness Ready   What is the last grade level you completed in school? 13   Complications   Last HgB A1C per patient/outside source 8.6 %  05-11-15   How often do you check your blood sugar? --  2x/day   Fasting Blood glucose range (mg/dL) 70-129   Postprandial Blood glucose range (mg/dL) >200   Number of hypoglycemic episodes per month 0   Have you had a dilated eye exam in the past 12 months? Yes   Have you had a dental exam in the past 12 months? No  2 yr ago-has no teeth-has upper and lower dentures   Are you checking your feet? Yes   Dietary Intake   Breakfast --  breakfast time varies 7a-10a; eats fried foods 4-5x/wk.   Snack (morning) --  occasionally eats carrots   Lunch --  often skips lunch   Dinner --  supper time varies 5:30p-8p; eats sweets 1-2x/wk.   Snack (evening) --  pretzels   Beverage(s) --  drinks orange juice 1x/day; drinks 6-7 glasses of water/day and 6-7 glasses of crystal light&power ade/day   Exercise   Exercise Type --  walks/treadmill/bikes ([facility]wk[facility]   How many days per week to you exercise? --  4-5 days/wk   How many minutes per day do you exercise? --  1 hour +   Patient Education   Previous Diabetes Education No   Disease state  --  discussed pathophysiology of type 2 diabetes and treatment options   Nutrition management  Role of diet in the treatment of diabetes and the relationship between the three main macronutrients and blood glucose level;Food label reading, portion sizes and measuring food.;Carbohydrate counting   Physical activity and exercise  Role of exercise on diabetes management, blood pressure control and cardiac health.   Medications Taught/reviewed insulin injection, site rotation, insulin storage and needle disposal.;Reviewed patients medication for diabetes, action, purpose, timing of dose and side effects.  reviewed use of Lantus pen  and Metformin   Monitoring Purpose and frequency of SMBG.;Taught/discussed recording of test results and interpretation of SMBG.;Identified appropriate SMBG and/or A1C goals.;Daily foot exams;Yearly dilated eye exam   Acute complications Taught treatment of hypoglycemia - the 15 rule.  gave pt 1 tube of glucose tablets for low BG as needed use    Chronic complications Relationship between chronic complications and blood glucose control;Retinopathy and reason for yearly dilated eye exams;Identified and discussed with patient  current chronic complications;Lipid levels, blood glucose control and heart disease;Assessed and discussed foot care and prevention of foot problems   Personal strategies to  promote health Lifestyle issues that need to be addressed for better diabetes care      Individualized Plan for Diabetes Self-Management Training:   Learning Objective:  Patient will have a greater understanding of diabetes self-management. Patient education plan is to attend individual and/or group sessions per assessed needs and concerns.   Plan:   Patient Instructions   Check  blood sugars 2 x day before breakfast and 2 hrs after supper every day  Exercise:  Continue regular exercise 4-5x/wk.   Avoid sugar sweetened drinks (soda, tea, coffee, sports drinks, juices) Limit intake of fried foods , sweets and snack foods  Eat 3 meals day,   1  snack a day at bedtime  Space meals 4-5 hours apart  Bring blood sugar records to the next appointment/class  Carry fast acting glucose and a snack at all times  Rotate injection sites  Return for appointment/classes on:  06-15-15   Expected Outcomes:   positive  Education material provided: general meal planning Guidelines, low BG handout, 1 tube glucose tablets for PRN use If problems or questions, patient to contact team via:  [phone]  Future DSME appointment:  06-15-15

## 2015-06-06 NOTE — Patient Instructions (Signed)
  Check blood sugars 2 x day before breakfast and 2 hrs after supper every day  Exercise:  Continue regular exercise 4-5x/wk.   Avoid sugar sweetened drinks (soda, tea, coffee, sports drinks, juices) Limit intake of fried foods , sweets and snack foods  Eat 3 meals day,   1  snack a day at bedtime  Space meals 4-5 hours apart  Bring blood sugar records to the next appointment/class  Carry fast acting glucose and a snack at all times  Rotate injection sites  Return for appointment/classes on:  06-15-15

## 2015-06-15 ENCOUNTER — Encounter: Payer: Medicare Other | Admitting: Dietician

## 2015-06-15 ENCOUNTER — Encounter: Payer: Self-pay | Admitting: Dietician

## 2015-06-15 VITALS — Ht 72.0 in | Wt 248.9 lb

## 2015-06-15 DIAGNOSIS — E1159 Type 2 diabetes mellitus with other circulatory complications: Secondary | ICD-10-CM

## 2015-06-15 DIAGNOSIS — E119 Type 2 diabetes mellitus without complications: Secondary | ICD-10-CM | POA: Diagnosis not present

## 2015-06-15 NOTE — Progress Notes (Signed)
Appt. Start Time: 0930 Appt. End Time: 1200  Class 1 Diabetes Overview - define DM; state own type of DM; identify functions of pancreas and insulin; define insulin deficiency vs insulin resistance  Psychosocial - identify DM as a source of stress; state the effects of stress on BG control; verbalize appropriate stress management techniques; identify personal stress issues   Nutritional Management - describe effects of food on blood glucose; identify sources of carbohydrate, protein and fat; verbalize the importance of balance meals in controlling blood glucose; identify meals as well balanced or not; estimate servings of carbohydrate from menus; use food labels to identify servings size, content of carbohydrate, fiber, protein, fat, saturated fat and sodium; recognize food sources of fat, saturated fat, trans fat, sodium and verbalize goals for intake; describe healthful appropriate food choices when dining out   Exercise - describe the effects of exercise on blood glucose and importance of regular exercise in controlling diabetes; state a plan for personal exercise; verbalize contraindications for exercise  Self-Monitoring - state importance of HBGM and demo procedure accurately; use HBGM results to effectively manage diabetes; identify importance of regular HbA1C testing and goals for results  Acute Complications/Sick Day Guidelines - recognize hyperglycemia and hypoglycemia with causes and effects; identify blood glucose results as high, low or in control; list steps in treating and preventing high and low blood glucose; state appropriate measure to manage blood glucose when ill (need for meds, HBGM plan, when to call physician, need for fluids)  Chronic Complications/Foot, Skin, Eye Dental Care - identify possible long-term complications of diabetes (retinopathy, neuropathy, nephropathy, cardiovascular disease, infections); explain steps in prevention and treatment of chronic complications; state  importance of daily self-foot exams; describe how to examine feet and what to look for; explain appropriate eye and dental care  Lifestyle Changes/Goals & Health/Community Resources - state benefits of making appropriate lifestyle changes; identify habits that need to change (meals, tobacco, alcohol); identify strategies to reduce risk factors for personal health; set goals for proper diabetes care; state need for and frequency of healthcare follow-up; describe appropriate community resources for good health (ADA, web sites, apps)   Pregnancy/Sexual Health - define gestational diabetes; state importance of good blood glucose control and birth control prior to pregnancy; state importance of good blood glucose control in preventing sexual problems (impotence, vaginal dryness, infections, loss of desire); state relationship of blood glucose control and pregnancy outcome; describe risk of maternal and fetal complications  Teaching Materials Used: Class 1 Slides/Notebook Diabetes Booklet ID Card  Medic Alert/Medic ID Forms Sleep Evaluation Exercise Handout Daily Food Record Planning a Balanced Meal Goals for Class 1

## 2015-06-22 ENCOUNTER — Encounter: Payer: Medicare Other | Admitting: *Deleted

## 2015-06-22 ENCOUNTER — Encounter: Payer: Self-pay | Admitting: *Deleted

## 2015-06-22 VITALS — Wt 250.3 lb

## 2015-06-22 DIAGNOSIS — E1159 Type 2 diabetes mellitus with other circulatory complications: Secondary | ICD-10-CM

## 2015-06-22 DIAGNOSIS — E119 Type 2 diabetes mellitus without complications: Secondary | ICD-10-CM | POA: Diagnosis not present

## 2015-06-22 NOTE — Progress Notes (Signed)

## 2015-06-29 ENCOUNTER — Encounter: Payer: Medicare Other | Admitting: Dietician

## 2015-06-29 ENCOUNTER — Encounter: Payer: Self-pay | Admitting: Dietician

## 2015-06-29 VITALS — BP 136/80 | Ht 72.0 in | Wt 252.9 lb

## 2015-06-29 DIAGNOSIS — E1159 Type 2 diabetes mellitus with other circulatory complications: Secondary | ICD-10-CM

## 2015-06-29 DIAGNOSIS — E119 Type 2 diabetes mellitus without complications: Secondary | ICD-10-CM | POA: Diagnosis not present

## 2015-06-29 NOTE — Progress Notes (Signed)

## 2015-07-03 ENCOUNTER — Encounter: Payer: Self-pay | Admitting: Dietician

## 2015-07-03 NOTE — Progress Notes (Signed)
Called pt for followup on BG's-pt reports since starting Victoza,  FBG's have been mostly 70's-80's and 2 hr pp BG's 120's-180. Advised pt to eat small bedtime snack of 1 carb serving + protein and check with MD before increasing Victoza dose

## 2015-12-08 ENCOUNTER — Emergency Department: Payer: Medicare Other

## 2015-12-08 ENCOUNTER — Observation Stay
Admission: EM | Admit: 2015-12-08 | Discharge: 2015-12-09 | Disposition: A | Discharge status: Home / Self Care | Payer: Medicare Other | Attending: Specialist | Admitting: Specialist

## 2015-12-08 ENCOUNTER — Encounter: Payer: Self-pay | Admitting: *Deleted

## 2015-12-08 DIAGNOSIS — I129 Hypertensive chronic kidney disease with stage 1 through stage 4 chronic kidney disease, or unspecified chronic kidney disease: Secondary | ICD-10-CM | POA: Diagnosis not present

## 2015-12-08 DIAGNOSIS — Z7982 Long term (current) use of aspirin: Secondary | ICD-10-CM | POA: Diagnosis not present

## 2015-12-08 DIAGNOSIS — I219 Acute myocardial infarction, unspecified: Secondary | ICD-10-CM | POA: Insufficient documentation

## 2015-12-08 DIAGNOSIS — Z7902 Long term (current) use of antithrombotics/antiplatelets: Secondary | ICD-10-CM | POA: Diagnosis not present

## 2015-12-08 DIAGNOSIS — R531 Weakness: Secondary | ICD-10-CM | POA: Diagnosis not present

## 2015-12-08 DIAGNOSIS — I2510 Atherosclerotic heart disease of native coronary artery without angina pectoris: Secondary | ICD-10-CM | POA: Diagnosis not present

## 2015-12-08 DIAGNOSIS — E785 Hyperlipidemia, unspecified: Secondary | ICD-10-CM | POA: Diagnosis not present

## 2015-12-08 DIAGNOSIS — Z794 Long term (current) use of insulin: Secondary | ICD-10-CM | POA: Diagnosis not present

## 2015-12-08 DIAGNOSIS — Z79899 Other long term (current) drug therapy: Secondary | ICD-10-CM | POA: Insufficient documentation

## 2015-12-08 DIAGNOSIS — E1122 Type 2 diabetes mellitus with diabetic chronic kidney disease: Secondary | ICD-10-CM | POA: Insufficient documentation

## 2015-12-08 DIAGNOSIS — M1990 Unspecified osteoarthritis, unspecified site: Secondary | ICD-10-CM | POA: Insufficient documentation

## 2015-12-08 DIAGNOSIS — H3530 Unspecified macular degeneration: Secondary | ICD-10-CM | POA: Diagnosis not present

## 2015-12-08 DIAGNOSIS — D72829 Elevated white blood cell count, unspecified: Secondary | ICD-10-CM

## 2015-12-08 DIAGNOSIS — K219 Gastro-esophageal reflux disease without esophagitis: Secondary | ICD-10-CM | POA: Insufficient documentation

## 2015-12-08 DIAGNOSIS — E039 Hypothyroidism, unspecified: Secondary | ICD-10-CM | POA: Diagnosis not present

## 2015-12-08 DIAGNOSIS — Z955 Presence of coronary angioplasty implant and graft: Secondary | ICD-10-CM | POA: Diagnosis not present

## 2015-12-08 DIAGNOSIS — R079 Chest pain, unspecified: Secondary | ICD-10-CM | POA: Diagnosis not present

## 2015-12-08 DIAGNOSIS — R001 Bradycardia, unspecified: Secondary | ICD-10-CM | POA: Diagnosis not present

## 2015-12-08 DIAGNOSIS — Z87891 Personal history of nicotine dependence: Secondary | ICD-10-CM | POA: Diagnosis not present

## 2015-12-08 DIAGNOSIS — E119 Type 2 diabetes mellitus without complications: Secondary | ICD-10-CM

## 2015-12-08 DIAGNOSIS — N183 Chronic kidney disease, stage 3 unspecified: Secondary | ICD-10-CM

## 2015-12-08 DIAGNOSIS — E1151 Type 2 diabetes mellitus with diabetic peripheral angiopathy without gangrene: Secondary | ICD-10-CM | POA: Insufficient documentation

## 2015-12-08 DIAGNOSIS — D649 Anemia, unspecified: Secondary | ICD-10-CM | POA: Insufficient documentation

## 2015-12-08 HISTORY — DX: Presence of coronary angioplasty implant and graft: Z95.5

## 2015-12-08 HISTORY — DX: Acute myocardial infarction, unspecified: I21.9

## 2015-12-08 LAB — COMPREHENSIVE METABOLIC PANEL
ALT: 16 U/L — AB (ref 17–63)
ANION GAP: 6 (ref 5–15)
AST: 19 U/L (ref 15–41)
Albumin: 3.9 g/dL (ref 3.5–5.0)
Alkaline Phosphatase: 45 U/L (ref 38–126)
BUN: 25 mg/dL — ABNORMAL HIGH (ref 6–20)
CHLORIDE: 109 mmol/L (ref 101–111)
CO2: 24 mmol/L (ref 22–32)
CREATININE: 1.56 mg/dL — AB (ref 0.61–1.24)
Calcium: 8.7 mg/dL — ABNORMAL LOW (ref 8.9–10.3)
GFR calc non Af Amer: 41 mL/min — ABNORMAL LOW (ref >60)
GFR, EST AFRICAN AMERICAN: 48 mL/min — AB (ref >60)
Glucose, Bld: 132 mg/dL — ABNORMAL HIGH (ref 65–99)
POTASSIUM: 4.7 mmol/L (ref 3.5–5.1)
SODIUM: 139 mmol/L (ref 135–145)
Total Bilirubin: <0.1 mg/dL — ABNORMAL LOW (ref 0.3–1.2)
Total Protein: 6.8 g/dL (ref 6.5–8.1)

## 2015-12-08 LAB — CBC WITH DIFFERENTIAL/PLATELET
Basophils Absolute: 0.1 10*3/uL (ref 0–0.1)
Basophils Relative: 1 %
EOS ABS: 0.3 10*3/uL (ref 0–0.7)
EOS PCT: 3 %
HCT: 39.4 % — ABNORMAL LOW (ref 40.0–52.0)
Hemoglobin: 13 g/dL (ref 13.0–18.0)
LYMPHS ABS: 1.5 10*3/uL (ref 1.0–3.6)
Lymphocytes Relative: 14 %
MCH: 30.5 pg (ref 26.0–34.0)
MCHC: 33.1 g/dL (ref 32.0–36.0)
MCV: 92.2 fL (ref 80.0–100.0)
MONO ABS: 0.7 10*3/uL (ref 0.2–1.0)
Monocytes Relative: 6 %
Neutro Abs: 8.3 10*3/uL — ABNORMAL HIGH (ref 1.4–6.5)
Neutrophils Relative %: 76 %
PLATELETS: 187 10*3/uL (ref 150–440)
RBC: 4.27 MIL/uL — AB (ref 4.40–5.90)
RDW: 14.1 % (ref 11.5–14.5)
WBC: 10.8 10*3/uL — AB (ref 3.8–10.6)

## 2015-12-08 LAB — TROPONIN I
Troponin I: <0.03 ng/mL (ref <0.03)
Troponin I: <0.03 ng/mL (ref <0.03)

## 2015-12-08 LAB — GLUCOSE, CAPILLARY: Glucose-Capillary: 136 mg/dL — ABNORMAL HIGH (ref 65–99)

## 2015-12-08 LAB — TSH: TSH: 3.195 u[IU]/mL (ref 0.350–4.500)

## 2015-12-08 MED ORDER — ONDANSETRON HCL 4 MG/2ML IJ SOLN: 4.0000 mg | Freq: Four times a day (QID) | INTRAMUSCULAR | Status: DC | PRN

## 2015-12-08 MED ORDER — ACETAMINOPHEN 325 MG PO TABS: 650.0000 mg | ORAL_TABLET | Freq: Four times a day (QID) | ORAL | Status: DC | PRN

## 2015-12-08 MED ORDER — ONDANSETRON HCL 4 MG PO TABS: 4.0000 mg | ORAL_TABLET | Freq: Four times a day (QID) | ORAL | Status: DC | PRN

## 2015-12-08 MED ORDER — CYCLOBENZAPRINE HCL 10 MG PO TABS: 10.0000 mg | ORAL_TABLET | Freq: Three times a day (TID) | ORAL | Status: DC | PRN

## 2015-12-08 MED ORDER — ACETAMINOPHEN 650 MG RE SUPP: 650.0000 mg | Freq: Four times a day (QID) | RECTAL | Status: DC | PRN

## 2015-12-08 MED ORDER — SODIUM CHLORIDE 0.9% FLUSH
3.0000 mL | Freq: Two times a day (BID) | INTRAVENOUS | Status: DC
  Administered 2015-12-08: 3 mL via INTRAVENOUS

## 2015-12-08 MED ORDER — SODIUM CHLORIDE 0.9% FLUSH: 3.0000 mL | Freq: Two times a day (BID) | INTRAVENOUS | Status: DC

## 2015-12-08 MED ORDER — TRAMADOL HCL 50 MG PO TABS: 50.0000 mg | ORAL_TABLET | Freq: Three times a day (TID) | ORAL | Status: DC | PRN

## 2015-12-08 MED ORDER — ENOXAPARIN SODIUM 40 MG/0.4ML ~~LOC~~ SOLN
40.0000 mg | SUBCUTANEOUS | Status: DC
  Administered 2015-12-08: 40 mg via SUBCUTANEOUS
  Filled 2015-12-08: qty 0.4

## 2015-12-08 MED ORDER — NITROGLYCERIN 0.4 MG SL SUBL: 0.4000 mg | SUBLINGUAL_TABLET | SUBLINGUAL | Status: DC | PRN

## 2015-12-08 MED ORDER — INSULIN ASPART 100 UNIT/ML ~~LOC~~ SOLN: 0.0000 [IU] | Freq: Three times a day (TID) | SUBCUTANEOUS | Status: DC

## 2015-12-08 MED ORDER — INSULIN ASPART 100 UNIT/ML ~~LOC~~ SOLN: 0.0000 [IU] | Freq: Every day | SUBCUTANEOUS | Status: DC

## 2015-12-08 MED ORDER — ROSUVASTATIN CALCIUM 20 MG PO TABS
20.0000 mg | ORAL_TABLET | Freq: Every day | ORAL | Status: DC
  Administered 2015-12-08 – 2015-12-09 (×2): 20 mg via ORAL
  Filled 2015-12-08 (×2): qty 1

## 2015-12-08 MED ORDER — CLOPIDOGREL BISULFATE 75 MG PO TABS
75.0000 mg | ORAL_TABLET | Freq: Every day | ORAL | Status: DC
Start: 2015-12-08 — End: 2015-12-09
  Administered 2015-12-08 – 2015-12-09 (×2): 75 mg via ORAL
  Filled 2015-12-08 (×2): qty 1

## 2015-12-08 MED ORDER — GABAPENTIN 300 MG PO CAPS
300.0000 mg | ORAL_CAPSULE | Freq: Every day | ORAL | Status: DC
  Administered 2015-12-08: 300 mg via ORAL
  Filled 2015-12-08: qty 1

## 2015-12-08 MED ORDER — OCUVITE-LUTEIN PO CAPS
2.0000 | ORAL_CAPSULE | Freq: Every day | ORAL | Status: DC
  Administered 2015-12-09: 2 via ORAL
  Filled 2015-12-08: qty 2

## 2015-12-08 MED ORDER — ASPIRIN EC 81 MG PO TBEC
81.0000 mg | DELAYED_RELEASE_TABLET | Freq: Every day | ORAL | Status: DC
  Administered 2015-12-09: 81 mg via ORAL
  Filled 2015-12-08: qty 1

## 2015-12-08 MED ORDER — SODIUM CHLORIDE 0.9% FLUSH: 3.0000 mL | INTRAVENOUS | Status: DC | PRN

## 2015-12-08 MED ORDER — LISINOPRIL 5 MG PO TABS
5.0000 mg | ORAL_TABLET | Freq: Every day | ORAL | Status: DC
  Administered 2015-12-08 – 2015-12-09 (×2): 5 mg via ORAL
  Filled 2015-12-08 (×2): qty 1

## 2015-12-08 MED ORDER — SODIUM CHLORIDE 0.9 % IV SOLN: 250.0000 mL | INTRAVENOUS | Status: DC | PRN

## 2015-12-08 MED ORDER — INSULIN GLARGINE 100 UNIT/ML ~~LOC~~ SOLN
45.0000 [IU] | Freq: Every day | SUBCUTANEOUS | Status: DC
  Administered 2015-12-08: 45 [IU] via SUBCUTANEOUS
  Filled 2015-12-08 (×2): qty 0.45

## 2015-12-08 MED ORDER — METFORMIN HCL 500 MG PO TABS
500.0000 mg | ORAL_TABLET | Freq: Two times a day (BID) | ORAL | Status: DC
  Filled 2015-12-08: qty 1

## 2015-12-08 MED ORDER — INSULIN ASPART 100 UNIT/ML ~~LOC~~ SOLN
3.0000 [IU] | Freq: Three times a day (TID) | SUBCUTANEOUS | Status: DC
  Filled 2015-12-08: qty 3

## 2015-12-08 MED ORDER — PANTOPRAZOLE SODIUM 40 MG PO TBEC
40.0000 mg | DELAYED_RELEASE_TABLET | Freq: Every day | ORAL | Status: DC
  Administered 2015-12-09: 40 mg via ORAL
  Filled 2015-12-08: qty 1

## 2015-12-08 MED ORDER — LEVOTHYROXINE SODIUM 25 MCG PO TABS
25.0000 ug | ORAL_TABLET | Freq: Every day | ORAL | Status: DC
  Filled 2015-12-08 (×2): qty 1

## 2015-12-08 MED ORDER — VITAMIN B-12 1000 MCG PO TABS
2500.0000 ug | ORAL_TABLET | Freq: Every day | ORAL | Status: DC
  Administered 2015-12-09: 2500 ug via ORAL
  Filled 2015-12-08: qty 3

## 2015-12-08 NOTE — ED Provider Notes (Signed)
[facility] Emergency Department Provider Note   ____________________________________________   First MD Initiated Contact with Patient 12/08/15 1203     (approximate)  I have reviewed the triage vital signs and the nursing notes.   HISTORY  Chief Complaint Chest Pain    HPI [name] [name] is a 75 y.o. male with hypertension, hyperlipidemia, diabetes, and coronary artery disease status post previous PCI with multiple stents followed by Dr. [name] who presents for evaluation of intermittent bandlike nonradiating noneertional chest pain today since 10 am, gradual onset, severe, resolved with nitroglycerin, feels similar to prior heart attacks. No shortness of breath but pain was associated with diaphoresis. No vomiting, diarrhea, fevers or chills. He took 2 nitroglycerin tablets which caused resolution of his pain. He also took 324 mg of aspirin prior to arrival.   Past Medical History:  Diagnosis Date  . Anemia   . Arthritis   . CAD (coronary artery disease)   . Hyperlipidemia   . Hypertension   . Macular degeneration   . PVD (peripheral vascular disease) [facility])     Patient Active Problem List   Diagnosis Date Noted  . Chest pain 12/08/2015  . Bradycardia 12/08/2015  . Generalized weakness 12/08/2015  . Leukocytosis 12/08/2015  . Chronic renal insufficiency, stage 3 (moderate) 12/08/2015    History reviewed. No pertinent surgical history.  Prior to Admission medications   Medication Sig Start Date End Date Taking? Authorizing Provider  aspirin EC 81 MG tablet Take 1 tablet by mouth daily.   Yes Historical Provider, MD  clopidogrel (PLAVIX) 75 MG tablet Take 1 tablet by mouth daily. 02/13/15  Yes Historical Provider, MD  Cyanocobalamin (RA VITAMIN B-12 TR) 1000 MCG TBCR Take 2,500 mcg by mouth daily.   Yes Historical Provider, MD  cyclobenzaprine (FLEXERIL) 10 MG tablet Take 1 tablet by mouth 3 (three) times daily as needed. 08/17/14  Yes  Historical Provider, MD  gabapentin (NEURONTIN) 300 MG capsule Take 1 capsule by mouth at bedtime. 05/22/15  Yes Historical Provider, MD  Insulin Glargine (LANTUS SOLOSTAR) 100 UNIT/ML Solostar Pen Inject 45 Units into the skin at bedtime. 06/19/15 06/18/16 Yes Historical Provider, MD  levothyroxine (SYNTHROID, LEVOTHROID) 25 MCG tablet Take 1 tablet by mouth daily. Take on empty stomach with a glass of water 05/18/15 05/17/16 Yes Historical Provider, MD  lisinopril (PRINIVIL,ZESTRIL) 5 MG tablet Take 1 tablet by mouth daily. 03/28/15 03/27/16 Yes Historical Provider, MD  metFORMIN (GLUCOPHAGE) 500 MG tablet Take 1 tablet by mouth 2 (two) times daily. 02/15/15  Yes Historical Provider, MD  Multiple Vitamins-Minerals (PRESERVISION AREDS) CAPS Take 2 capsules by mouth daily.   Yes Historical Provider, MD  naproxen (NAPROSYN) 500 MG tablet Take 1 tablet by mouth 2 (two) times daily with a meal. As needed   Yes Historical Provider, MD  nitroGLYCERIN (NITROSTAT) 0.4 MG SL tablet Place 1 tablet under the tongue as needed. For chest pain 05/08/15  Yes Historical Provider, MD  omeprazole (PRILOSEC) 40 MG capsule Take 1 capsule by mouth daily. 12/18/14  Yes Historical Provider, MD  rosuvastatin (CRESTOR) 20 MG tablet Take 1 tablet by mouth daily. 09/02/14  Yes Historical Provider, MD  traMADol (ULTRAM) 50 MG tablet Take 1-2 tablets by mouth 3 (three) times daily as needed. Reported on 06/06/2015 06/30/14  Yes Historical Provider, MD    Allergies Sulfamethoxazole  History reviewed. No pertinent family history.  Social History Social History  Substance Use Topics  . Smoking status: Former Research scientist (life sciences)  . Smokeless tobacco:  Never Used  . Alcohol use 3.6 - 4.2 oz/week    6 - 7 Standard drinks or equivalent per week    Review of Systems Constitutional: No fever/chills Eyes: No visual changes. ENT: No sore throat. Cardiovascular: +chest pain. Respiratory: Denies shortness of breath. Gastrointestinal: No abdominal  pain.  No nausea, no vomiting.  No diarrhea.  No constipation. Genitourinary: Negative for dysuria. Musculoskeletal: Negative for back pain. Skin: Negative for rash. Neurological: Negative for headaches, focal weakness or numbness.  10-point ROS otherwise negative.  ____________________________________________   PHYSICAL EXAM:  Vitals:   12/08/15 1212  BP: 113/66  Pulse: 65  Resp: 16  Temp: 97.5 F (36.4 C)  TempSrc: Oral  SpO2: 100%  Weight: 240 lb (108.9 kg)  Height: 6' (1.829 m)    VITAL SIGNS: ED Triage Vitals  Enc Vitals Group     BP      Pulse      Resp      Temp      Temp src      SpO2      Weight      Height      Head Circumference      Peak Flow      Pain Score      Pain Loc      Pain Edu?      Excl. in [place]?     Constitutional: Alert and oriented. Well appearing and in no acute distress. Eyes: Conjunctivae are normal. PERRL. EOMI. Head: Atraumatic. Nose: No congestion/rhinnorhea. Mouth/Throat: Mucous membranes are moist.  Oropharynx non-erythematous. Neck: No stridor.  Supple Without meningismus. Cardiovascular: Normal rate, regular rhythm. Grossly normal heart sounds.  Good peripheral circulation. Respiratory: Normal respiratory effort.  No retractions. Lungs CTAB. Gastrointestinal: Soft and nontender. No distention.  No CVA tenderness. Genitourinary: deferred Musculoskeletal: No lower extremity tenderness nor edema.  No joint effusions. Neurologic:  Normal speech and language. No gross focal neurologic deficits are appreciated. No gait instability. Skin:  Skin is warm, dry and intact. No rash noted. Psychiatric: Mood and affect are normal. Speech and behavior are normal.  ____________________________________________   LABS (all labs ordered are listed, but only abnormal results are displayed)  Labs Reviewed  CBC WITH DIFFERENTIAL/PLATELET - Abnormal; Notable for the following:       Result Value   WBC 10.8 (*)    RBC 4.27 (*)    HCT 39.4  (*)    Neutro Abs 8.3 (*)    All other components within normal limits  COMPREHENSIVE METABOLIC PANEL - Abnormal; Notable for the following:    Glucose, Bld 132 (*)    BUN 25 (*)    Creatinine, Ser 1.56 (*)    Calcium 8.7 (*)    ALT 16 (*)    Total Bilirubin <0.1 (*)    GFR calc non Af Amer 41 (*)    GFR calc Af Amer 48 (*)    All other components within normal limits  TROPONIN I   ____________________________________________  EKG  ED ECG REPORT I, Joanne Gavel, the attending physician, personally viewed and interpreted this ECG.   Date: 12/08/2015  EKG Time: 12:03  Rate: 66  Rhythm: normal sinus rhythm  Axis: normal  Intervals:none  ST&T Change: No acute ST elevation or acute ST depression. Q waves in inferior leads as well as the anteroseptal leads. EKG is unchanged from 02/17/2013.  ____________________________________________  RADIOLOGY  CXR ____________________________________________   PROCEDURES  Procedure(s) performed: None  Procedures  Critical Care  performed: No  ____________________________________________   INITIAL IMPRESSION / ASSESSMENT AND PLAN / ED COURSE  Pertinent labs & imaging results that were available during my care of the patient were reviewed by me and considered in my medical decision making (see chart for details).  [name] [name] is a 75 y.o. male with hypertension, hyperlipidemia, diabetes, and coronary artery disease status post previous PCI with multiple stents followed by Dr. [name] who presents for evaluation of intermittent bandlike chest pain today. Early reports his chest pain is resolved. On exam, he is well-appearing and in no acute distress. Vital signs stable, he is afebrile. EKG is unchanged from prior. My concern is for ACS given history of the liver sensations for same. We'll obtain screening labs, chest x-ray, and anticipate admission. Clinical picture not consistent with PE or acute aortic  dissection. ----------------------------------------- 2:09 PM on 12/08/2015 ----------------------------------------- Labs show mild creatinine elevation at 1.56, CBC is generally unremarkable, initial troponin is negative. Chest x-ray clear. Patient still without any recurrence of chest pain. Case discussed with the hospitalist for admission.    Clinical Course     ____________________________________________   FINAL CLINICAL IMPRESSION(S) / ED DIAGNOSES  Final diagnoses:  Chest pain, unspecified type      NEW MEDICATIONS STARTED DURING THIS VISIT:  New Prescriptions   No medications on file     Note:  This document was prepared using Dragon voice recognition software and may include unintentional dictation errors.    Joanne Gavel, MD 12/08/15 1410

## 2015-12-08 NOTE — Consult Note (Signed)
[place]  CARDIOLOGY CONSULT NOTE  Patient ID: [name] [name] MRN: [id number] DOB/AGE: [birth year] 75 y.o.  Admit date: 12/08/2015 Referring Physician Dr. Ether Griffins Primary Physician  Dr. Doy Hutching Primary Cardiologist Dr. [name] Reason for Consultation chest pain and afib  HPI: Pt is a 75 yo male with history of cad s/p pci on duap, history of sob when bending over, history of dizziness on occasion expecially when standing up. He has an ef of 40% with a stress echo last year showeing no ischemia. He is now admitted with complaints of dull chest pain with dizziness, diapharesis, weakness. He was given sl ntg with some improvement. On the way to the er via ems, he had reported hypotension and bradycardia to 40's. Strips not available . He has ruled out for an mi thus far. He has no further chest pain. EKG showed no ischemia   . Review of Systems  Constitutional: Positive for diaphoresis.  HENT: Negative.   Eyes: Negative.   Respiratory: Negative.   Cardiovascular: Positive for chest pain.  Gastrointestinal: Negative.   Genitourinary: Negative.   Musculoskeletal: Negative.   Skin: Negative.   Neurological: Positive for dizziness.  Endo/Heme/Allergies: Negative.   Psychiatric/Behavioral: Negative.     Past Medical History:  Diagnosis Date  . Anemia   . Arthritis   . CAD (coronary artery disease)   . Hyperlipidemia   . Hypertension   . Macular degeneration   . PVD (peripheral vascular disease) ([place])     History reviewed. No pertinent family history.  Social History   Social History  . Marital status: Married    Spouse name: N/A  . Number of children: N/A  . Years of education: N/A   Occupational History  . Not on file.   Social History Main Topics  . Smoking status: Former Smoker    Types: Cigarettes    Quit date: 01/04/1998  . Smokeless tobacco: Never Used  . Alcohol use 7.2 - 8.4 oz/week    6 - 7 Standard drinks or  equivalent, 6 - 7 Cans of beer per week  . Drug use: No  . Sexual activity: No   Other Topics Concern  . Not on file   Social History Narrative  . No narrative on file    History reviewed. No pertinent surgical history.   Prescriptions Prior to Admission  Medication Sig Dispense Refill Last Dose  . aspirin EC 81 MG tablet Take 1 tablet by mouth daily.   12/08/2015 at 0800  . clopidogrel (PLAVIX) 75 MG tablet Take 1 tablet by mouth daily.   12/07/2015 at 1800  . Cyanocobalamin (RA VITAMIN B-12 TR) 1000 MCG TBCR Take 2,500 mcg by mouth daily.   12/08/2015 at 0800  . cyclobenzaprine (FLEXERIL) 10 MG tablet Take 1 tablet by mouth 3 (three) times daily as needed.   prn at prn  . gabapentin (NEURONTIN) 300 MG capsule Take 1 capsule by mouth at bedtime.   12/07/2015 at 1800  . Insulin Glargine (LANTUS SOLOSTAR) 100 UNIT/ML Solostar Pen Inject 45 Units into the skin at bedtime.   12/07/2015 at 1800  . levothyroxine (SYNTHROID, LEVOTHROID) 25 MCG tablet Take 1 tablet by mouth daily. Take on empty stomach with a glass of water   12/08/2015 at 0800  . lisinopril (PRINIVIL,ZESTRIL) 5 MG tablet Take 1 tablet by mouth daily.   12/07/2015 at 1800  . metFORMIN (GLUCOPHAGE) 500 MG tablet Take 1 tablet by mouth 2 (two) times daily.   12/08/2015  at 0800  . Multiple Vitamins-Minerals (PRESERVISION AREDS) CAPS Take 2 capsules by mouth daily.   12/08/2015 at 0800  . naproxen (NAPROSYN) 500 MG tablet Take 1 tablet by mouth 2 (two) times daily with a meal. As needed   prn at prn  . nitroGLYCERIN (NITROSTAT) 0.4 MG SL tablet Place 1 tablet under the tongue as needed. For chest pain   prn at prn  . omeprazole (PRILOSEC) 40 MG capsule Take 1 capsule by mouth daily.   12/08/2015 at 0800  . rosuvastatin (CRESTOR) 20 MG tablet Take 1 tablet by mouth daily.   12/07/2015 at 1800  . traMADol (ULTRAM) 50 MG tablet Take 1-2 tablets by mouth 3 (three) times daily as needed. Reported on 06/06/2015   prn at prn    Physical Exam: Blood  pressure 115/67, pulse 66, temperature 97.5 F (36.4 C), temperature source Oral, resp. rate 12, height 6' (1.829 m), weight 108.9 kg (240 lb), SpO2 98 %.   Wt Readings from Last 1 Encounters:  12/08/15 108.9 kg (240 lb)     General appearance: alert and cooperative Resp: clear to auscultation bilaterally Cardio: regular rate and rhythm GI: soft, non-tender; bowel sounds normal; no masses,  no organomegaly Neurologic: Grossly normal  Labs:   Lab Results  Component Value Date   WBC 10.8 (H) 12/08/2015   HGB 13.0 12/08/2015   HCT 39.4 (L) 12/08/2015   MCV 92.2 12/08/2015   PLT 187 12/08/2015    Recent Labs Lab 12/08/15 1214  NA 139  K 4.7  CL 109  CO2 24  BUN 25*  CREATININE 1.56*  CALCIUM 8.7*  PROT 6.8  BILITOT <0.1*  ALKPHOS 45  ALT 16*  AST 19  GLUCOSE 132*   Lab Results  Component Value Date   CKTOTAL 62 02/18/2013   CKMB 1.8 02/18/2013   TROPONINI <0.03 12/08/2015      Radiology: No acute cardiopulmonary disease EKG: nsr with no ischemic changes  ASSESSMENT AND PLAN:  75 yo male with history of cad s/p pci who was admitted with progressive chest pain with diapharesis. These symptoms have resloved and he has ruled out for an mi. He is currently asymptomatic at present. Will proceed with functional study in the am.  Signed: Teodoro Spray MD, [facility] 12/08/2015, 7:27 PM

## 2015-12-08 NOTE — H&P (Signed)
[facility] NAME: [name] [name]    MR#:  [id number]  DATE OF BIRTH:  Jul 14, 1940  DATE OF ADMISSION:  12/08/2015  PRIMARY CARE PHYSICIAN: SPARKS,JEFFREY D, MD   REQUESTING/REFERRING PHYSICIAN:   CHIEF COMPLAINT:   Chief Complaint  Patient presents with  . Chest Pain    HISTORY OF PRESENT ILLNESS: [name] [name]  is a 75 y.o. male with a known history of Coronary artery disease, hypertension, hyperlipidemia, peripheral vascular disease, who presents to the hospital with complaints of chest pain. Patient describes pain as dull 2 out of 10 by intensity, constant pain that was no significant change with walking or deep breathing, with no radiation or alleviating or aggravating factors. Patient's pain sided and left lower chest area and then spread across the chest. It was associated with dizziness and clamminess, sweating, weakness, but no shortness of breath, he took 2 nitroglycerin with some improvement of his symptoms. His pain lasted 1 hour. While he was being transported to emergency room via EMS, his heart rate dropped down to 40s. Hospitalist services were contacted for admission.  Patient admits of feeling poorly intermittently, frequently in the morning, he would check his blood pressure and his blood pressure would be low, then he would eat some salt in order to improve his blood pressure. Unfortunately, he noted also lower extremity swelling, which has been there for a long period of time.  The patient was on Lasix, however, it was discontinued by Dr. Doy Hutching in the past.  PAST MEDICAL HISTORY:   Past Medical History:  Diagnosis Date  . Anemia   . Arthritis   . CAD (coronary artery disease)   . Hyperlipidemia   . Hypertension   . Macular degeneration   . PVD (peripheral vascular disease) ([place])     PAST SURGICAL HISTORY: History reviewed. No pertinent surgical history.  SOCIAL HISTORY:  Social History  Substance Use  Topics  . Smoking status: Former Research scientist (life sciences)  . Smokeless tobacco: Never Used  . Alcohol use 3.6 - 4.2 oz/week    6 - 7 Standard drinks or equivalent per week    FAMILY HISTORY: No early coronary artery disease  DRUG ALLERGIES:  Allergies  Allergen Reactions  . Sulfamethoxazole Hives    unknown    Review of Systems  Constitutional: Positive for malaise/fatigue. Negative for chills, fever and weight loss.  HENT: Negative for congestion.   Eyes: Negative for blurred vision and double vision.  Respiratory: Negative for cough, sputum production, shortness of breath and wheezing.   Cardiovascular: Positive for chest pain and leg swelling. Negative for palpitations, orthopnea and PND.  Gastrointestinal: Negative for abdominal pain, blood in stool, constipation, diarrhea, nausea and vomiting.  Genitourinary: Negative for dysuria, frequency, hematuria and urgency.  Musculoskeletal: Negative for falls.  Neurological: Positive for weakness. Negative for dizziness, tremors, focal weakness and headaches.  Endo/Heme/Allergies: Does not bruise/bleed easily.  Psychiatric/Behavioral: Negative for depression. The patient does not have insomnia.     MEDICATIONS AT HOME:  Prior to Admission medications   Medication Sig Start Date End Date Taking? Authorizing Provider  aspirin EC 81 MG tablet Take 1 tablet by mouth daily.   Yes Historical Provider, MD  clopidogrel (PLAVIX) 75 MG tablet Take 1 tablet by mouth daily. 02/13/15  Yes Historical Provider, MD  Cyanocobalamin (RA VITAMIN B-12 TR) 1000 MCG TBCR Take 2,500 mcg by mouth daily.   Yes Historical Provider, MD  cyclobenzaprine (FLEXERIL) 10 MG tablet Take  1 tablet by mouth 3 (three) times daily as needed. 08/17/14  Yes Historical Provider, MD  gabapentin (NEURONTIN) 300 MG capsule Take 1 capsule by mouth at bedtime. 05/22/15  Yes Historical Provider, MD  Insulin Glargine (LANTUS SOLOSTAR) 100 UNIT/ML Solostar Pen Inject 45 Units into the skin at  bedtime. 06/19/15 06/18/16 Yes Historical Provider, MD  levothyroxine (SYNTHROID, LEVOTHROID) 25 MCG tablet Take 1 tablet by mouth daily. Take on empty stomach with a glass of water 05/18/15 05/17/16 Yes Historical Provider, MD  lisinopril (PRINIVIL,ZESTRIL) 5 MG tablet Take 1 tablet by mouth daily. 03/28/15 03/27/16 Yes Historical Provider, MD  metFORMIN (GLUCOPHAGE) 500 MG tablet Take 1 tablet by mouth 2 (two) times daily. 02/15/15  Yes Historical Provider, MD  Multiple Vitamins-Minerals (PRESERVISION AREDS) CAPS Take 2 capsules by mouth daily.   Yes Historical Provider, MD  naproxen (NAPROSYN) 500 MG tablet Take 1 tablet by mouth 2 (two) times daily with a meal. As needed   Yes Historical Provider, MD  nitroGLYCERIN (NITROSTAT) 0.4 MG SL tablet Place 1 tablet under the tongue as needed. For chest pain 05/08/15  Yes Historical Provider, MD  omeprazole (PRILOSEC) 40 MG capsule Take 1 capsule by mouth daily. 12/18/14  Yes Historical Provider, MD  rosuvastatin (CRESTOR) 20 MG tablet Take 1 tablet by mouth daily. 09/02/14  Yes Historical Provider, MD  traMADol (ULTRAM) 50 MG tablet Take 1-2 tablets by mouth 3 (three) times daily as needed. Reported on 06/06/2015 06/30/14  Yes Historical Provider, MD      PHYSICAL EXAMINATION:   VITAL SIGNS: Blood pressure 113/66, pulse 65, temperature 97.5 F (36.4 C), temperature source Oral, resp. rate 16, height 6' (1.829 m), weight 108.9 kg (240 lb), SpO2 100 %.  GENERAL:  75 y.o.-year-old patient lying in the bed with no acute distress.  EYES: Pupils equal, round, reactive to light and accommodation. No scleral icterus. Extraocular muscles intact.  HEENT: Head atraumatic, normocephalic. Oropharynx and nasopharynx clear.  NECK:  Supple, no jugular venous distention. No thyroid enlargement, no tenderness.  LUNGS: Normal breath sounds bilaterally, no wheezing, rales,rhonchi or crepitation. No use of accessory muscles of respiration.  CARDIOVASCULAR: S1, S2 normal, Distant.  No murmurs, rubs, or gallops.  ABDOMEN: Soft, nontender, nondistended. Bowel sounds present. No organomegaly or mass.  EXTREMITIES: No pedal edema, cyanosis, or clubbing.  NEUROLOGIC: Cranial nerves II through XII are intact. Muscle strength 5/5 in all extremities. Sensation intact. Gait not checked.  PSYCHIATRIC: The patient is alert and oriented x 3.  SKIN: No obvious rash, lesion, or ulcer.   LABORATORY PANEL:   CBC  Recent Labs Lab 12/08/15 1214  WBC 10.8*  HGB 13.0  HCT 39.4*  PLT 187  MCV 92.2  MCH 30.5  MCHC 33.1  RDW 14.1  LYMPHSABS 1.5  MONOABS 0.7  EOSABS 0.3  BASOSABS 0.1   ------------------------------------------------------------------------------------------------------------------  Chemistries   Recent Labs Lab 12/08/15 1214  NA 139  K 4.7  CL 109  CO2 24  GLUCOSE 132*  BUN 25*  CREATININE 1.56*  CALCIUM 8.7*  AST 19  ALT 16*  ALKPHOS 45  BILITOT <0.1*   ------------------------------------------------------------------------------------------------------------------  Cardiac Enzymes  Recent Labs Lab 12/08/15 1214  TROPONINI <0.03   ------------------------------------------------------------------------------------------------------------------  RADIOLOGY: Dg Chest 2 View  Result Date: 12/08/2015 CLINICAL DATA:  Chest pain beginning at 10 a.m. this morning. EXAM: CHEST  2 VIEW COMPARISON:  CT chest 02/12/2011. FINDINGS: The lungs are clear. Heart size is normal. No pneumothorax or pleural effusion. Thoracic spondylosis is identified. IMPRESSION:  No acute disease. Electronically Signed   By: Inge Rise M.D.   On: 12/08/2015 12:56    EKG: Orders placed or performed during the hospital encounter of 12/08/15  . ED EKG  . ED EKG  EKG reveals sinus rhythm with marked sinus arrhythmia, first degree AV block at 66 bpm, normal axis, low voltage QRS, septal infarct with QS waves in V1, V3. No active CT changes  IMPRESSION AND  PLAN:  Active Problems:   Chest pain   Bradycardia   Generalized weakness   Leukocytosis   Chronic renal insufficiency, stage 3 (moderate) #1 chest pain, admitted to medical floor, initiate him on nitroglycerin, aspirin, Lovenox, check reticulocyte enzymes 3, get cardiologist involved for further recommendations, likely stress test in the morning, unable to give her beta blockers due to bradycardia in the Route to the hospital #2. Bradycardia, get TSH, no beta blockers #3. Generalized weakness episodes, unclear etiology, patient reports weakness episodes are associated with hypotension, however, he takes blood pressure medication, follow blood pressure readings closely as well as heart rate, which could be responsible for generalized weakness episodes as well #4. Leukocytosis of unclear etiology, no obvious infection noted, follow in the morning, nontobacco therapy for now, get urinalysis #5. CK D stage III, get urinalysis to rule out-tract infection, follow labs closely   All the records are reviewed and case discussed with ED provider. Management plans discussed with the patient, family and they are in agreement.  CODE STATUS: Code Status History    This patient does not have a recorded code status. Please follow your organizational policy for patients in this situation.    Advance Directive Documentation   Flowsheet Row Most Recent Value  Type of Advance Directive  Healthcare Power of Attorney, Living will  Pre-existing out of facility DNR order (yellow form or pink MOST form)  No data  "MOST" Form in Place?  No data       TOTAL TIME TAKING CARE OF THIS PATIENT: 50 minutes.    Theodoro Grist M.D on 12/08/2015 at 2:21 PM  Between 7am to 6pm - Pager - [phone] After 6pm go to [email or web address] - password [email or web address]  Office  [phone]  CC: Primary care physician; Idelle Crouch, MD

## 2015-12-08 NOTE — Care Management Obs Status (Signed)
[place] NOTIFICATION   Patient Details  Name: [name] [name] MRN: [id number] Date of Birth: [birth year]   Medicare Observation Status Notification Given:  Yes    Beau Fanny, RN 12/08/2015, 2:27 PM

## 2015-12-08 NOTE — ED Triage Notes (Signed)
Pt reports while driving he had sudden onset of left sided chest pain that radiated to the right side at 10:00. Pt denies SOB but reports becoming diaphoretic and weak. Pt took 1 nitro at 10:00 with relief of pain but not of diaphoresis and weakness. Pt took another nitro at 10:45 and 324 mg aspirin that reportedly relieved symptoms. Pt denies chest pain at this time. 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 EMS reports pt has been having HR drop into the 40s while in transit to ED. Pt is alert and oriented x 4 upon arrival and able to answer all questions.

## 2015-12-08 NOTE — Care Management CC44 (Signed)
Condition Code 44 Documentation Completed  Patient Details  Name: [name] [name] MRN: [id number] Date of Birth: 07-Jun-1940   Condition Code 44 given:    Patient signature on Condition Code 44 notice:    Documentation of 2 MD's agreement:    Code 44 added to claim:       Beau Fanny, RN 12/08/2015, 2:27 PM

## 2015-12-08 NOTE — ED Notes (Signed)
Patient transported to X-ray 

## 2015-12-09 ENCOUNTER — Observation Stay: Payer: Medicare Other

## 2015-12-09 ENCOUNTER — Encounter: Payer: Self-pay | Admitting: Radiology

## 2015-12-09 DIAGNOSIS — R079 Chest pain, unspecified: Secondary | ICD-10-CM | POA: Diagnosis not present

## 2015-12-09 LAB — NM MYOCAR MULTI W/SPECT W/WALL MOTION / EF
CHL CUP NUCLEAR SDS: 0
CHL CUP NUCLEAR SRS: 18
CHL CUP NUCLEAR SSS: 4
LV dias vol: 147 mL (ref 62–150)
LV sys vol: 78 mL
NUC STRESS TID: 0.97

## 2015-12-09 LAB — CBC
HCT: 36.2 % — ABNORMAL LOW (ref 40.0–52.0)
HEMOGLOBIN: 12.6 g/dL — AB (ref 13.0–18.0)
MCH: 31.4 pg (ref 26.0–34.0)
MCHC: 34.9 g/dL (ref 32.0–36.0)
MCV: 90 fL (ref 80.0–100.0)
Platelets: 173 10*3/uL (ref 150–440)
RBC: 4.02 MIL/uL — AB (ref 4.40–5.90)
RDW: 14.2 % (ref 11.5–14.5)
WBC: 9.2 10*3/uL (ref 3.8–10.6)

## 2015-12-09 LAB — BASIC METABOLIC PANEL
ANION GAP: 6 (ref 5–15)
BUN: 24 mg/dL — ABNORMAL HIGH (ref 6–20)
CHLORIDE: 111 mmol/L (ref 101–111)
CO2: 23 mmol/L (ref 22–32)
Calcium: 8.7 mg/dL — ABNORMAL LOW (ref 8.9–10.3)
Creatinine, Ser: 1.51 mg/dL — ABNORMAL HIGH (ref 0.61–1.24)
GFR calc non Af Amer: 43 mL/min — ABNORMAL LOW (ref >60)
GFR, EST AFRICAN AMERICAN: 50 mL/min — AB (ref >60)
Glucose, Bld: 106 mg/dL — ABNORMAL HIGH (ref 65–99)
Potassium: 4.1 mmol/L (ref 3.5–5.1)
Sodium: 140 mmol/L (ref 135–145)

## 2015-12-09 LAB — HEMOGLOBIN A1C
Hgb A1c MFr Bld: 6.5 % — ABNORMAL HIGH (ref 4.8–5.6)
Mean Plasma Glucose: 140 mg/dL

## 2015-12-09 LAB — GLUCOSE, CAPILLARY
Glucose-Capillary: 85 mg/dL (ref 65–99)
Glucose-Capillary: 109 mg/dL — ABNORMAL HIGH (ref 65–99)

## 2015-12-09 LAB — TROPONIN I

## 2015-12-09 MED ORDER — TECHNETIUM TC 99M TETROFOSMIN IV KIT
13.3910 | PACK | Freq: Once | INTRAVENOUS | Status: AC | PRN
  Administered 2015-12-09: 13.391 via INTRAVENOUS

## 2015-12-09 MED ORDER — TECHNETIUM TC 99M TETROFOSMIN IV KIT
30.0000 | PACK | Freq: Once | INTRAVENOUS | Status: AC | PRN
  Administered 2015-12-09: 31.075 via INTRAVENOUS

## 2015-12-09 MED ORDER — IBUPROFEN 800 MG PO TABS
ORAL_TABLET | ORAL | Status: AC
  Filled 2015-12-09: qty 1

## 2015-12-09 NOTE — Discharge Summary (Signed)
[facility] [place] NAME: [name] [name]    MR#:  [id number]  DATE OF BIRTH:  1941-02-16  DATE OF ADMISSION:  12/08/2015 ADMITTING PHYSICIAN: Theodoro Grist, MD  DATE OF DISCHARGE: 12/09/2015  1:30 PM  PRIMARY CARE PHYSICIAN: SPARKS,JEFFREY D, MD    ADMISSION DIAGNOSIS:  Chest pain [R07.9] Chest pain, unspecified type [R07.9]  DISCHARGE DIAGNOSIS:  Active Problems:   Chest pain   Bradycardia   Generalized weakness   Leukocytosis   Chronic renal insufficiency, stage 3 (moderate)   SECONDARY DIAGNOSIS:   Past Medical History:  Diagnosis Date  . Anemia   . Arthritis   . CAD (coronary artery disease)   . History of heart artery stent 2014   Per patient, [name] put in 2 stents in 2014.  [name] Hyperlipidemia   . Hypertension   . Macular degeneration   . Myocardial infarction 1999   Per Pt, MI in 1999.  [name] PVD (peripheral vascular disease) [facility] [place])     HOSPITAL COURSE:    75 year old male with past medical history of chronic anemia, history of coronary artery disease, hypertension, hyperlipidemia, macular degeneration, previous history of MI, peripheral asked disease presented to the hospital with chest pain.  1. Chest pain-patient does have significant risk factors for coronary disease given his previous history of stent placement and therefore was observed overnight on telemetry, had 3 sets of cardiac markers checked which were negative. -He underwent a nuclear medicine stress test which showed an apical scar without any evidence of acute ischemia. He is currently chest pain-free and hemodynamically stable and therefore being discharged home. -he will follow-up with his cardiologist as outpatient. He will continue his aspirin, Plavix, nitroglycerin, Crestor  2.  type II DM without complication-patient will resume his Metformin, Lantus  3. Hypothyroidism - pt. Will resume his synthroid.   4. Hyperlipidemia - pt. Will resume his  Crestor  5. GERD - pt. Will resume his Omeprazole.   DISCHARGE CONDITIONS:   Stable  CONSULTS OBTAINED:  Treatment Team:  Teodoro Spray, MD  DRUG ALLERGIES:   Allergies  Allergen Reactions  . Sulfamethoxazole Hives    unknown    DISCHARGE MEDICATIONS:     Medication List    TAKE these medications   aspirin EC 81 MG tablet Take 1 tablet by mouth daily.   clopidogrel 75 MG tablet Commonly known as:  PLAVIX Take 1 tablet by mouth daily.   CRESTOR 20 MG tablet Generic drug:  rosuvastatin Take 1 tablet by mouth daily.   cyclobenzaprine 10 MG tablet Commonly known as:  FLEXERIL Take 1 tablet by mouth 3 (three) times daily as needed.   gabapentin 300 MG capsule Commonly known as:  NEURONTIN Take 1 capsule by mouth at bedtime.   LANTUS SOLOSTAR 100 UNIT/ML Solostar Pen Generic drug:  Insulin Glargine Inject 45 Units into the skin at bedtime.   levothyroxine 25 MCG tablet Commonly known as:  SYNTHROID, LEVOTHROID Take 1 tablet by mouth daily. Take on empty stomach with a glass of water   lisinopril 5 MG tablet Commonly known as:  PRINIVIL,ZESTRIL Take 1 tablet by mouth daily.   metFORMIN 500 MG tablet Commonly known as:  GLUCOPHAGE Take 1 tablet by mouth 2 (two) times daily.   naproxen 500 MG tablet Commonly known as:  NAPROSYN Take 1 tablet by mouth 2 (two) times daily with a meal. As needed   NITROSTAT 0.4 MG SL tablet Generic drug:  nitroGLYCERIN Place 1 tablet  under the tongue as needed. For chest pain   omeprazole 40 MG capsule Commonly known as:  PRILOSEC Take 1 capsule by mouth daily.   PRESERVISION AREDS Caps Take 2 capsules by mouth daily.   RA VITAMIN B-12 TR 1000 MCG Tbcr Generic drug:  Cyanocobalamin Take 2,500 mcg by mouth daily.   traMADol 50 MG tablet Commonly known as:  ULTRAM Take 1-2 tablets by mouth 3 (three) times daily as needed. Reported on 06/06/2015         DISCHARGE INSTRUCTIONS:   DIET:  Cardiac diet and  Diabetic diet  DISCHARGE CONDITION:  Stable  ACTIVITY:  Activity as tolerated  OXYGEN:  Home Oxygen: No.   Oxygen Delivery: room air  DISCHARGE LOCATION:  home   If you experience worsening of your admission symptoms, develop shortness of breath, life threatening emergency, suicidal or homicidal thoughts you must seek medical attention immediately by calling 911 or calling your MD immediately  if symptoms less severe.  You Must read complete instructions/literature along with all the possible adverse reactions/side effects for all the Medicines you take and that have been prescribed to you. Take any new Medicines after you have completely understood and accpet all the possible adverse reactions/side effects.   Please note  You were cared for by a hospitalist during your hospital stay. If you have any questions about your discharge medications or the care you received while you were in the hospital after you are discharged, you can call the unit and asked to speak with the hospitalist on call if the hospitalist that took care of you is not available. Once you are discharged, your primary care physician will handle any further medical issues. Please note that NO REFILLS for any discharge medications will be authorized once you are discharged, as it is imperative that you return to your primary care physician (or establish a relationship with a primary care physician if you do not have one) for your aftercare needs so that they can reassess your need for medications and monitor your lab values.     Today  Chest pain resolved. No other acute events or complaints.    VITAL SIGNS:  Blood pressure 119/68, pulse 92, temperature 98.2 F (36.8 C), temperature source Oral, resp. rate 18, height 6' (1.829 m), weight 108.9 kg (240 lb), SpO2 100 %.  I/O:   Intake/Output Summary (Last 24 hours) at 12/09/15 1519 Last data filed at 12/09/15 0900  Gross per 24 hour  Intake                0 ml   Output              650 ml  Net             -650 ml    PHYSICAL EXAMINATION:  GENERAL:  75 y.o.-year-old patient lying in the bed with no acute distress.  EYES: Pupils equal, round, reactive to light and accommodation. No scleral icterus. Extraocular muscles intact.  HEENT: Head atraumatic, normocephalic. Oropharynx and nasopharynx clear.  NECK:  Supple, no jugular venous distention. No thyroid enlargement, no tenderness.  LUNGS: Normal breath sounds bilaterally, no wheezing, rales,rhonchi. No use of accessory muscles of respiration.  CARDIOVASCULAR: S1, S2 normal. No murmurs, rubs, or gallops.  ABDOMEN: Soft, non-tender, non-distended. Bowel sounds present. No organomegaly or mass.  EXTREMITIES: No pedal edema, cyanosis, or clubbing.  NEUROLOGIC: Cranial nerves II through XII are intact. No focal motor or sensory defecits b/l.  PSYCHIATRIC: The patient  is alert and oriented x 3. Good affect.  SKIN: No obvious rash, lesion, or ulcer.   DATA REVIEW:   CBC  Recent Labs Lab 12/09/15 0453  WBC 9.2  HGB 12.6*  HCT 36.2*  PLT 173    Chemistries   Recent Labs Lab 12/08/15 1214 12/09/15 0453  NA 139 140  K 4.7 4.1  CL 109 111  CO2 24 23  GLUCOSE 132* 106*  BUN 25* 24*  CREATININE 1.56* 1.51*  CALCIUM 8.7* 8.7*  AST 19  --   ALT 16*  --   ALKPHOS 45  --   BILITOT <0.1*  --     Cardiac Enzymes  Recent Labs Lab 12/09/15 0453  TROPONINI <0.03    RADIOLOGY:  Dg Chest 2 View  Result Date: 12/08/2015 CLINICAL DATA:  Chest pain beginning at 10 a.m. this morning. EXAM: CHEST  2 VIEW COMPARISON:  CT chest 02/12/2011. FINDINGS: The lungs are clear. Heart size is normal. No pneumothorax or pleural effusion. Thoracic spondylosis is identified. IMPRESSION: No acute disease. Electronically Signed   By: Inge Rise M.D.   On: 12/08/2015 12:56   Nm Myocar Multi W/spect W/wall Motion / Ef  Result Date: 12/09/2015  Defect 1: There is a defect present in the apex  location.  This is a low risk study.  Findings consistent with prior myocardial infarction.  The left ventricular ejection fraction is mildly decreased (45-54%).  Blood pressure demonstrated a normal response to exercise.  There was no ST segment deviation noted during stress.  Excellent exercixe function. Fixed apical defect . No reversible ischemia.      Management plans discussed with the patient, family and they are in agreement.  CODE STATUS:     Code Status Orders        Start     Ordered   12/08/15 1708  Full code  Continuous     12/08/15 1707    Code Status History    Date Active Date Inactive Code Status Order ID Comments User Context   This patient has a current code status but no historical code status.    Advance Directive Documentation   Flowsheet Row Most Recent Value  Type of Advance Directive  Healthcare Power of Attorney, Living will  Pre-existing out of facility DNR order (yellow form or pink MOST form)  No data  "MOST" Form in Place?  No data      TOTAL TIME TAKING CARE OF THIS PATIENT: 40 minutes.    Henreitta Leber M.D on 12/09/2015 at 3:19 PM  Between 7am to 6pm - Pager - [phone]  After 6pm go to [email or web address] - Proofreader  [facility]  Office  [phone]  CC: Primary care physician; Idelle Crouch, MD

## 2015-12-09 NOTE — Progress Notes (Signed)
[place]  SUBJECTIVE: No further chest pain.    Vitals:   12/08/15 1708 12/08/15 1715 12/08/15 1953 12/09/15 0525  BP:  115/67 125/78 (!) 100/50  Pulse:  66 67 80  Resp:  12 16 18   Temp:  97.5 F (36.4 C) 97.8 F (36.6 C) 98.2 F (36.8 C)  TempSrc:  Oral Oral Oral  SpO2: 98% 98% 96% 97%  Weight:  108.9 kg (240 lb)    Height:  6' (1.829 m)      Intake/Output Summary (Last 24 hours) at 12/09/15 0948 Last data filed at 12/09/15 0525  Gross per 24 hour  Intake                0 ml  Output              650 ml  Net             -650 ml    LABS: Basic Metabolic Panel:  Recent Labs  12/08/15 1214 12/09/15 0453  NA 139 140  K 4.7 4.1  CL 109 111  CO2 24 23  GLUCOSE 132* 106*  BUN 25* 24*  CREATININE 1.56* 1.51*  CALCIUM 8.7* 8.7*   Liver Function Tests:  Recent Labs  12/08/15 1214  AST 19  ALT 16*  ALKPHOS 45  BILITOT <0.1*  PROT 6.8  ALBUMIN 3.9   No results for input(s): LIPASE, AMYLASE in the last 72 hours. CBC:  Recent Labs  12/08/15 1214 12/09/15 0453  WBC 10.8* 9.2  NEUTROABS 8.3*  --   HGB 13.0 12.6*  HCT 39.4* 36.2*  MCV 92.2 90.0  PLT 187 173   Cardiac Enzymes:  Recent Labs  12/08/15 1820 12/08/15 2253 12/09/15 0453  TROPONINI <0.03 <0.03 <0.03   BNP: Invalid input(s): POCBNP D-Dimer: No results for input(s): DDIMER in the last 72 hours. Hemoglobin A1C:  Recent Labs  12/08/15 1820  HGBA1C 6.5*   Fasting Lipid Panel: No results for input(s): CHOL, HDL, LDLCALC, TRIG, CHOLHDL, LDLDIRECT in the last 72 hours. Thyroid Function Tests:  Recent Labs  12/08/15 1820  TSH 3.195   Anemia Panel: No results for input(s): VITAMINB12, FOLATE, FERRITIN, TIBC, IRON, RETICCTPCT in the last 72 hours.   Physical Exam: Blood pressure (!) 100/50, pulse 80, temperature 98.2 F (36.8 C), temperature source Oral, resp. rate 18, height 6' (1.829 m), weight 108.9 kg (240 lb), SpO2 97 %.   Wt Readings  from Last 1 Encounters:  12/08/15 108.9 kg (240 lb)     General appearance: alert and cooperative Resp: clear to auscultation bilaterally Cardio: regular rate and rhythm GI: soft, non-tender; bowel sounds normal; no masses,  no organomegaly Extremities: extremities normal, atraumatic, no cyanosis or edema Neurologic: Grossly normal  TELEMETRY: Reviewed telemetry pt in nsr:  ASSESSMENT AND PLAN:  Active Problems:   Chest pain-ruled out for an mi. Functional study done this am. If negative for significant ischemia, would discharge with out patient follow up.    Bradycardia-improved. No further episodes.    Generalized weakness   Leukocytosis   Chronic renal insufficiency, stage 3 (moderate)    Teodoro Spray., MD, [facility] 12/09/2015 9:48 AM

## 2015-12-09 NOTE — Discharge Instructions (Signed)
Angina Pectoris  Angina pectoris, often called angina, is extreme discomfort in the chest, neck, or arm. This is caused by a lack of blood in the middle and thickest layer of the heart wall (myocardium). There are four types of angina:  · Stable angina. Stable angina usually occurs in episodes of predictable frequency and duration. It is usually brought on by physical activity, stress, or excitement. Stable angina usually lasts a few minutes and can often be relieved by a medicine that you place under your tongue. This medicine is called sublingual nitroglycerin.  · Unstable angina. Unstable angina can occur even when you are doing little or no physical activity. It can even occur while you are sleeping or when you are at rest. It can suddenly increase in severity or frequency. It may not be relieved by sublingual nitroglycerin, and it can last up to 30 minutes.  · Microvascular angina. This type of angina is caused by a disorder of tiny blood vessels called arterioles. Microvascular angina is more common in women. The pain may be more severe and last longer than other types of angina pectoris.  · Prinzmetal or variant angina. This type of angina pectoris is rare and usually occurs when you are doing little or no physical activity. It especially occurs in the early morning hours.  CAUSES  Atherosclerosis is the cause of angina. This is the buildup of fat and cholesterol (plaque) on the inside of the arteries. Over time, the plaque may narrow or block the artery, and this will lessen blood flow to the heart. Plaque can also become weak and break off within a coronary artery to form a clot and cause a sudden blockage.  RISK FACTORS  Risk factors common to both men and women include:  · High cholesterol levels.  · High blood pressure (hypertension).  · Tobacco use.  · Diabetes.  · Family history of angina.  · Obesity.  · Lack of exercise.  · A diet high in saturated fats.  Women are at greater risk for angina if they  are:  · Over age 55.  · Postmenopausal.  SYMPTOMS  Many people do not experience any symptoms during the early stages of angina. As the condition progresses, symptoms common to both men and women may include:  · Chest pain.    The pain can be described as a crushing or squeezing in the chest, or a tightness, pressure, fullness, or heaviness in the chest.    The pain can last more than a few minutes, or it can stop and recur.  · Pain in the arms, neck, jaw, or back.  · Unexplained heartburn or indigestion.  · Shortness of breath.  · Nausea.  · Sudden cold sweats.  · Sudden light-headedness.  Many women have chest discomfort and some of the other symptoms. However, women often have different (atypical) symptoms, such as:   · Fatigue.  · Unexplained feelings of nervousness or anxiety.  · Unexplained weakness.  · Dizziness or fainting.  Sometimes, women may have angina without any symptoms.  DIAGNOSIS   Tests to diagnose angina may include:  · ECG (electrocardiogram).  · Exercise stress test. This looks for signs of blockage when the heart is being exercised.  · Pharmacologic stress test. This test looks for signs of blockage when the heart is being stressed with a medicine.  · Blood tests.  · Coronary angiogram. This is a procedure to look at the coronary arteries to see if there is any blockage.    TREATMENT   The treatment of angina may include the following:  · Healthy behavioral changes to reduce or control risk factors.  · Medicine.  · Coronary stenting. A stent helps to keep an artery open.  · Coronary angioplasty. This procedure widens a narrowed or blocked artery.  · Coronary artery bypass surgery. This will allow your blood to pass the blockage (bypass) to reach your heart.  HOME CARE INSTRUCTIONS   · Take medicines only as directed by your health care provider.  · Do not take the following medicines unless your health care provider approves:    Nonsteroidal anti-inflammatory drugs (NSAIDs), such as ibuprofen,  naproxen, or celecoxib.    Vitamin supplements that contain vitamin A, vitamin E, or both.    Hormone replacement therapy that contains estrogen with or without progestin.  · Manage other health conditions such as hypertension and diabetes as directed by your health care provider.  · Follow a heart-healthy diet. A dietitian can help to educate you about healthy food options and changes.  · Use healthy cooking methods such as roasting, grilling, broiling, baking, poaching, steaming, or stir-frying. Talk to a dietitian to learn more about healthy cooking methods.  · Follow an exercise program approved by your health care provider.  · Maintain a healthy weight. Lose weight as approved by your health care provider.  · Plan rest periods when fatigued.  · Learn to manage stress.  · Do not use any tobacco products, including cigarettes, chewing tobacco, or electronic cigarettes. If you need help quitting, ask your health care provider.  · If you drink alcohol, and your health care provider approves, limit your alcohol intake to no more than 1 drink per day. One drink equals 12 ounces of beer, 5 ounces of wine, or 1½ ounces of hard liquor.  · Stop illegal drug use.  · Keep all follow-up visits as directed by your health care provider. This is important.  SEEK IMMEDIATE MEDICAL CARE IF:   · You have pain in your chest, neck, arm, jaw, stomach, or back that lasts more than a few minutes, is recurring, or is unrelieved by taking sublingual nitroglycerin.  · You have profuse sweating without cause.  · You have unexplained:    Heartburn or indigestion.    Shortness of breath or difficulty breathing.    Nausea or vomiting.    Fatigue.    Feelings of nervousness or anxiety.    Weakness.    Diarrhea.  · You have sudden light-headedness or dizziness.  · You faint.  These symptoms may represent a serious problem that is an emergency. Do not wait to see if the symptoms will go away. Get medical help right away. Call your local  emergency services (911 in the [place] Do not drive yourself to the hospital.     This information is not intended to replace advice given to you by your health care provider. Make sure you discuss any questions you have with your health care provider.     Document Released: 02/18/2005 Document Revised: 03/11/2014 Document Reviewed: 06/22/2013  Elsevier Interactive Patient Education ©2016 [employer]

## 2015-12-09 NOTE — Progress Notes (Signed)
ETT stress with excellent exercise tolerance. Fixed apical defect with no reversible ischemia OK for discharge on current meds. Follow up with Dr. [name] in 1-2 weeks.

## 2015-12-09 NOTE — Progress Notes (Signed)
Patient off unit for cardiac testing. Will resume care & pass medications when returns. Wenda Low [facility]

## 2017-01-16 ENCOUNTER — Emergency Department: Payer: Medicare Other

## 2017-01-16 ENCOUNTER — Emergency Department
Admission: EM | Admit: 2017-01-16 | Discharge: 2017-01-16 | Disposition: A | Discharge status: Home / Self Care | Payer: Medicare Other | Attending: Emergency Medicine | Admitting: Emergency Medicine

## 2017-01-16 ENCOUNTER — Encounter: Payer: Self-pay | Admitting: Emergency Medicine

## 2017-01-16 DIAGNOSIS — Z7902 Long term (current) use of antithrombotics/antiplatelets: Secondary | ICD-10-CM | POA: Insufficient documentation

## 2017-01-16 DIAGNOSIS — Z7982 Long term (current) use of aspirin: Secondary | ICD-10-CM | POA: Insufficient documentation

## 2017-01-16 DIAGNOSIS — Z7984 Long term (current) use of oral hypoglycemic drugs: Secondary | ICD-10-CM | POA: Diagnosis not present

## 2017-01-16 DIAGNOSIS — Z87891 Personal history of nicotine dependence: Secondary | ICD-10-CM | POA: Diagnosis not present

## 2017-01-16 DIAGNOSIS — I10 Essential (primary) hypertension: Secondary | ICD-10-CM | POA: Insufficient documentation

## 2017-01-16 DIAGNOSIS — I2510 Atherosclerotic heart disease of native coronary artery without angina pectoris: Secondary | ICD-10-CM | POA: Insufficient documentation

## 2017-01-16 DIAGNOSIS — R0789 Other chest pain: Secondary | ICD-10-CM | POA: Insufficient documentation

## 2017-01-16 DIAGNOSIS — R079 Chest pain, unspecified: Secondary | ICD-10-CM

## 2017-01-16 DIAGNOSIS — I252 Old myocardial infarction: Secondary | ICD-10-CM | POA: Insufficient documentation

## 2017-01-16 DIAGNOSIS — Z79899 Other long term (current) drug therapy: Secondary | ICD-10-CM | POA: Diagnosis not present

## 2017-01-16 LAB — CBC
HCT: 40 % (ref 40.0–52.0)
Hemoglobin: 13.1 g/dL (ref 13.0–18.0)
MCH: 30.4 pg (ref 26.0–34.0)
MCHC: 32.7 g/dL (ref 32.0–36.0)
MCV: 92.8 fL (ref 80.0–100.0)
PLATELETS: 199 10*3/uL (ref 150–440)
RBC: 4.31 MIL/uL — AB (ref 4.40–5.90)
RDW: 14 % (ref 11.5–14.5)
WBC: 11 10*3/uL — ABNORMAL HIGH (ref 3.8–10.6)

## 2017-01-16 LAB — BASIC METABOLIC PANEL
Anion gap: 9 (ref 5–15)
BUN: 27 mg/dL — ABNORMAL HIGH (ref 6–20)
CALCIUM: 9.2 mg/dL (ref 8.9–10.3)
CHLORIDE: 108 mmol/L (ref 101–111)
CO2: 23 mmol/L (ref 22–32)
CREATININE: 1.63 mg/dL — AB (ref 0.61–1.24)
GFR calc Af Amer: 45 mL/min — ABNORMAL LOW (ref >60)
GFR calc non Af Amer: 39 mL/min — ABNORMAL LOW (ref >60)
GLUCOSE: 106 mg/dL — AB (ref 65–99)
Potassium: 4.2 mmol/L (ref 3.5–5.1)
Sodium: 140 mmol/L (ref 135–145)

## 2017-01-16 LAB — TROPONIN I: Troponin I: <0.03 ng/mL (ref <0.03)

## 2017-01-16 NOTE — ED Provider Notes (Signed)
[facility] Emergency Department Provider Note ____________________________________________   I have reviewed the triage vital signs and the triage nursing note.  HISTORY  Chief Complaint Chest Pain   Historian Patient  HPI [name] [name] is a 76 y.o. male with a history of coronary artery disease, follows with Dr. Nehemiah [name], 2 stents in 2014, prior to arrival had onset of central chest discomfort and burning that felt somewhat like heartburn.  He just had [employer].  However he started pouring sweat for a few minutes and this reminded him of when he had his heart attack several years ago.  Symptoms are currently gone, lasted 10 or 20 minutes or so.  No recent fevers or coughing or breathing problems.  No shortness of breath or pleuritic chest pain.    Past Medical History:  Diagnosis Date  . Anemia   . Arthritis   . CAD (coronary artery disease)   . History of heart artery stent 2014   Per patient, [name] [name] put in 2 stents in 2014.  [name] Hyperlipidemia   . Hypertension   . Macular degeneration   . Myocardial infarction [name]) 1999   Per Pt, MI in 1999.  [name] PVD (peripheral vascular disease) [name])     Patient Active Problem List   Diagnosis Date Noted  . Chest pain 12/08/2015  . Bradycardia 12/08/2015  . Generalized weakness 12/08/2015  . Leukocytosis 12/08/2015  . Chronic renal insufficiency, stage 3 (moderate) ([name]) 12/08/2015    History reviewed. No pertinent surgical history.  Prior to Admission medications   Medication Sig Start Date End Date Taking? Authorizing Provider  aspirin EC 81 MG tablet Take 1 tablet by mouth daily.    [provider]  clopidogrel (PLAVIX) 75 MG tablet Take 1 tablet by mouth daily. 02/13/15   [provider]  Cyanocobalamin (RA VITAMIN B-12 TR) 1000 MCG TBCR Take 2,500 mcg by mouth daily.    [provider]  cyclobenzaprine (FLEXERIL) 10 MG tablet Take 1 tablet by mouth 3 (three) times daily as  needed. 08/17/14   [provider]  gabapentin (NEURONTIN) 300 MG capsule Take 1 capsule by mouth at bedtime. 05/22/15   [provider]  Insulin Glargine (LANTUS SOLOSTAR) 100 UNIT/ML Solostar Pen Inject 45 Units into the skin at bedtime. 06/19/15 06/18/16  [provider]  levothyroxine (SYNTHROID, LEVOTHROID) 25 MCG tablet Take 1 tablet by mouth daily. Take on empty stomach with a glass of water 05/18/15 05/17/16  [provider]  lisinopril (PRINIVIL,ZESTRIL) 5 MG tablet Take 1 tablet by mouth daily. 03/28/15 03/27/16  [provider]  metFORMIN (GLUCOPHAGE) 500 MG tablet Take 1 tablet by mouth 2 (two) times daily. 02/15/15   [provider]  Multiple Vitamins-Minerals (PRESERVISION AREDS) CAPS Take 2 capsules by mouth daily.    [provider]  naproxen (NAPROSYN) 500 MG tablet Take 1 tablet by mouth 2 (two) times daily with a meal. As needed    [provider]  nitroGLYCERIN (NITROSTAT) 0.4 MG SL tablet Place 1 tablet under the tongue as needed. For chest pain 05/08/15   [provider]  omeprazole (PRILOSEC) 40 MG capsule Take 1 capsule by mouth daily. 12/18/14   [provider]  rosuvastatin (CRESTOR) 20 MG tablet Take 1 tablet by mouth daily. 09/02/14   [provider]  traMADol (ULTRAM) 50 MG tablet Take 1-2 tablets by mouth 3 (three) times daily as needed. Reported on 06/06/2015 06/30/14   [provider]    Allergies  Allergen Reactions  . Sulfamethoxazole Hives    unknown  . Fish Oil Rash    No family history on file.  Social History Social History   Tobacco Use  . Smoking status: Former Smoker    Types: Cigarettes    Last attempt to quit: 01/04/1998    Years since quitting: 19.0  . Smokeless tobacco: Never Used  Substance Use Topics  . Alcohol use: Yes    Alcohol/week: 7.2 - 8.4 oz    Types: 6 - 7 Standard drinks or equivalent, 6 - 7 Cans of beer per week  . Drug use: No     Review of Systems  Constitutional: Negative for fever. Eyes: Negative for visual changes. ENT: Negative for sore throat. Cardiovascular: Positive for chest pain. Respiratory: Negative for shortness of breath. Gastrointestinal: Negative for abdominal pain, vomiting and diarrhea. Genitourinary: Negative for dysuria. Musculoskeletal: Negative for back pain. Skin: Negative for rash. Neurological: Negative for headache.  ____________________________________________   PHYSICAL EXAM:  VITAL SIGNS: ED Triage Vitals  Enc Vitals Group     BP      Pulse      Resp      Temp      Temp src      SpO2      Weight      Height      Head Circumference      Peak Flow      Pain Score      Pain Loc      Pain Edu?      Excl. in [place]?      Constitutional: Alert and oriented. Well appearing and in no distress. HEENT   Head: Normocephalic and atraumatic.      Eyes: Conjunctivae are normal. Pupils equal and round.       Ears:         Nose: No congestion/rhinnorhea.   Mouth/Throat: Mucous membranes are moist.   Neck: No stridor. Cardiovascular/Chest: Normal rate, regular rhythm.  No murmurs, rubs, or gallops. Respiratory: Normal respiratory effort without tachypnea nor retractions. Breath sounds are clear and equal bilaterally. No wheezes/rales/rhonchi. Gastrointestinal: Soft. No distention, no guarding, no rebound. Nontender.    Genitourinary/rectal:Deferred Musculoskeletal: Nontender with normal range of motion in all extremities. No joint effusions.  No lower extremity tenderness.  Trace edema. Neurologic:  Normal speech and language. No gross or focal neurologic deficits are appreciated. Skin:  Skin is warm, dry and intact. No rash noted. Psychiatric: Mood and affect are normal. Speech and behavior are normal. Patient exhibits appropriate insight and judgment.   ____________________________________________  LABS (pertinent positives/negatives) I, [name], MD the  attending physician have reviewed the labs noted below.  Labs Reviewed  BASIC METABOLIC PANEL - Abnormal; Notable for the following components:      Result Value   Glucose, Bld 106 (*)    BUN 27 (*)    Creatinine, Ser 1.63 (*)    GFR calc non Af Amer 39 (*)    GFR calc Af Amer 45 (*)    All other components within normal limits  CBC - Abnormal; Notable for the following components:   WBC 11.0 (*)    RBC 4.31 (*)    All other components within normal limits  TROPONIN I  TROPONIN I    ____________________________________________    EKG I, [name], MD, the attending physician have personally viewed and interpreted all ECGs.  68 bpm normal sinus rhythm.  Nonspecific intraventricular conduction delay.  Occasional PVC.  Normal axis.  Normal ST and T wave ____________________________________________  RADIOLOGY All Xrays were viewed by me.  Imaging interpreted by Radiologist, and I, [name], MD the attending physician have reviewed the radiologist interpretation noted below.  Chest x-ray portable:  IMPRESSION: No active cardiopulmonary disease. __________________________________________  PROCEDURES  Procedure(s) performed: None  Critical Care performed: None   ____________________________________________  ED COURSE / ASSESSMENT AND PLAN  Pertinent labs & imaging results that were available during my care of the patient were reviewed by me and considered in my medical decision making (see chart for details).  Patient had episode tonight after eating Eula Fried that felt like either indigestion, but had an episode with some diaphoresis which in some ways reminded him of his prior MI, and although symptoms are currently gone, he was encouraged to come in for evaluation.  Patient does have occasional PVC, states that he always has that.  We discussed the option of observation overnight for episode of chest pain with a history, versus initial and repeat troponin given  the patient has no symptoms now and the patient was very adamant he would prefer to go home.  He does have a cardiologist, Dr. Nehemiah [name].  I think that if his workup and observation.  Here in the emergency department continues to be reassuring with negative troponin and no return of symptoms, I think it is reasonable to discharge him home.  He does understand return precautions.  Initial labs and troponin are reassuring/negative.  Chronic renal insufficiency, negative troponin.  I updated the patient and family.  4 hour repeat troponin will be 11:30pm.  Patient care transferred to Dr. Quentin Cornwall at shift change 8:15 PM.  Awaiting repeat troponin.  If patient has no work current symptoms, and reassuring repeat troponin, patient may be discharged with my prepared discharge instructions.   DIFFERENTIAL DIAGNOSIS: Differential diagnosis includes, but is not limited to, ACS, aortic dissection, pulmonary embolism, cardiac tamponade, pneumothorax, pneumonia, pericarditis, myocarditis, GI-related causes including esophagitis/gastritis, and musculoskeletal chest wall pain.    CONSULTATIONS:   None   Patient / Family / Caregiver informed of clinical course, medical decision-making process, and agree with plan.   I discussed return precautions, follow-up instructions, and discharge instructions with patient and/or family.  Prepared Discharge Instructions :You are evaluated for chest pain episode, and although no certain cause was found, your exam and evaluation are reassuring in the emergency department today.  Return to the emergency department immediately for any new or worsening chest pain, sweats, nausea, vomiting, trouble breathing, shortness breath, dizziness passing out, or any other symptoms concerning to you.    ___________________________________________   FINAL CLINICAL IMPRESSION(S) / ED DIAGNOSES   Final diagnoses:  Nonspecific chest pain       ___________________________________________  ED Discharge Orders    None            Note: This dictation was prepared with Dragon dictation. Any transcriptional errors that result from this process are unintentional    [name], MD 01/16/17 2016

## 2017-01-16 NOTE — Discharge Instructions (Signed)
You are evaluated for chest pain episode, and although no certain cause was found, your exam and evaluation are reassuring in the emergency department today.  Return to the emergency department immediately for any new or worsening chest pain, sweats, nausea, vomiting, trouble breathing, shortness breath, dizziness passing out, or any other symptoms concerning to you.

## 2017-01-16 NOTE — ED Notes (Signed)
Refreshments given to patient at this time.

## 2017-01-16 NOTE — ED Triage Notes (Signed)
Pt comes into the ED via [facility] from home c/o central chest pain that started tonight after eating dinner.  Patient has h/o MI in the 20's.  All VS stable with EMS.  Patient took 1 nitro and 324 aspirin.  Denies shortness of breath, dizziness, N/V.  Denies any pain at this time.  Patient believes it may have been acid reflux from his dinner.

## 2017-01-16 NOTE — ED Provider Notes (Signed)
Patient received in sign-out from Dr. Reita Cliche.  Workup and evaluation pending follow up trop.  Repeat trop is normal.  Patient remains stable and in no acute distress.  Have discussed with the patient and available family all diagnostics and treatments performed thus far and all questions were answered to the best of my ability. The patient demonstrates understanding and agreement with plan. Merlyn Lot, MD 01/16/17 [phone]

## 2017-04-04 ENCOUNTER — Other Ambulatory Visit: Payer: Self-pay | Admitting: Surgery

## 2017-04-04 DIAGNOSIS — S46102A Unspecified injury of muscle, fascia and tendon of long head of biceps, left arm, initial encounter: Secondary | ICD-10-CM

## 2017-04-11 ENCOUNTER — Ambulatory Visit
Admission: RE | Admit: 2017-04-11 | Discharge: 2017-04-11 | Disposition: A | Discharge status: Home / Self Care | Payer: Medicare Other | Source: Ambulatory Visit | Attending: Surgery | Admitting: Surgery

## 2017-04-11 DIAGNOSIS — X58XXXA Exposure to other specified factors, initial encounter: Secondary | ICD-10-CM | POA: Insufficient documentation

## 2017-04-11 DIAGNOSIS — M19012 Primary osteoarthritis, left shoulder: Secondary | ICD-10-CM | POA: Insufficient documentation

## 2017-04-11 DIAGNOSIS — S46102A Unspecified injury of muscle, fascia and tendon of long head of biceps, left arm, initial encounter: Secondary | ICD-10-CM | POA: Diagnosis not present

## 2017-04-11 DIAGNOSIS — M75122 Complete rotator cuff tear or rupture of left shoulder, not specified as traumatic: Secondary | ICD-10-CM | POA: Insufficient documentation

## 2017-04-11 DIAGNOSIS — S46112A Strain of muscle, fascia and tendon of long head of biceps, left arm, initial encounter: Secondary | ICD-10-CM | POA: Diagnosis not present

## 2017-05-05 ENCOUNTER — Other Ambulatory Visit: Payer: Self-pay

## 2017-05-05 ENCOUNTER — Encounter
Admission: RE | Admit: 2017-05-05 | Discharge: 2017-05-05 | Disposition: A | Discharge status: Home / Self Care | Payer: Medicare Other | Source: Ambulatory Visit | Attending: Surgery | Admitting: Surgery

## 2017-05-05 DIAGNOSIS — Z01812 Encounter for preprocedural laboratory examination: Secondary | ICD-10-CM | POA: Diagnosis not present

## 2017-05-05 HISTORY — DX: Chronic kidney disease, unspecified: N18.9

## 2017-05-05 HISTORY — DX: Other intervertebral disc degeneration, thoracic region: M51.34

## 2017-05-05 HISTORY — DX: Hypothyroidism, unspecified: E03.9

## 2017-05-05 HISTORY — DX: Cardiac murmur, unspecified: R01.1

## 2017-05-05 HISTORY — DX: Gastro-esophageal reflux disease without esophagitis: K21.9

## 2017-05-05 HISTORY — DX: Type 2 diabetes mellitus without complications: E11.9

## 2017-05-05 HISTORY — DX: Personal history of Methicillin resistant Staphylococcus aureus infection: Z86.14

## 2017-05-05 LAB — CBC
HCT: 37.1 % — ABNORMAL LOW (ref 40.0–52.0)
Hemoglobin: 12.5 g/dL — ABNORMAL LOW (ref 13.0–18.0)
MCH: 30.7 pg (ref 26.0–34.0)
MCHC: 33.6 g/dL (ref 32.0–36.0)
MCV: 91.5 fL (ref 80.0–100.0)
PLATELETS: 183 10*3/uL (ref 150–440)
RBC: 4.05 MIL/uL — ABNORMAL LOW (ref 4.40–5.90)
RDW: 14.1 % (ref 11.5–14.5)
WBC: 9.5 10*3/uL (ref 3.8–10.6)

## 2017-05-05 LAB — BASIC METABOLIC PANEL
ANION GAP: 10 (ref 5–15)
BUN: 28 mg/dL — ABNORMAL HIGH (ref 6–20)
CALCIUM: 9.2 mg/dL (ref 8.9–10.3)
CO2: 22 mmol/L (ref 22–32)
Chloride: 108 mmol/L (ref 101–111)
Creatinine, Ser: 1.61 mg/dL — ABNORMAL HIGH (ref 0.61–1.24)
GFR, EST AFRICAN AMERICAN: 46 mL/min — AB (ref >60)
GFR, EST NON AFRICAN AMERICAN: 40 mL/min — AB (ref >60)
GLUCOSE: 184 mg/dL — AB (ref 65–99)
Potassium: 4.1 mmol/L (ref 3.5–5.1)
Sodium: 140 mmol/L (ref 135–145)

## 2017-05-05 LAB — SURGICAL PCR SCREEN
MRSA, PCR: NEGATIVE
STAPHYLOCOCCUS AUREUS: NEGATIVE

## 2017-05-05 NOTE — Pre-Procedure Instructions (Signed)
Echo complete12/09/2016 [facility] Component Name Value Ref Range  LV Ejection Fraction (%) 45   Aortic Valve Stenosis Grade none   Aortic Valve Regurgitation Grade mild   Aortic Valve Max Velocity (m/s) 1.4 m/sec  Mitral Valve Stenosis Grade none   Mitral Valve Regurgitation Grade mild   Tricuspid Valve Regurgitation Grade trivial   LV End Diastolic Diameter (cm) 5.2 cm  LV End Systolic Diameter (cm) 4 cm  LV Septum Wall Thickness (cm) 1.1 cm  LV Posterior Wall Thickness (cm) 1.1 cm  Left Atrium Diameter (cm) 4.2 cm  Result Narrative   CARDIOLOGY DEPARTMENT KINSER, FELLMAN [id number] [facility] Acct #: [id number] [address] [name], [place] [address] Date: 02/04/2017 10:03 AM  Adult Male Age: 77 yrs  ECHOCARDIOGRAM REPORT Outpatient  [facility] STUDY:CHEST WALL TAPE: MD1:KOWALSKI, BRUCE JAY  ECHO:Yes DOPPLER:YesFILE: BP:120/80 mmHg COLOR:YesCONTRAST:NoMACHINE:PhilipsHR: RV BIOPSY:No 3D:No SOUND QLTY:Moderate Height: 72 in  MEDIUM:None Weight:243 lb  BSA: 2.3 m2  ___________________________________________________________________________________________  HISTORY:DOE REASON:Assess, LV function INDICATION:Coronary artery disease involving native coronary artery of nati ___________________________________________________________________________________________  ECHOCARDIOGRAPHIC MEASUREMENTS 2D DIMENSIONS AORTA  ValuesNormal RangeMAIN PAValuesNormal Range Annulus:2.0 cm[2.3 - 2.9]PA Main:nm* [1.5 - 2.1] Aorta Sin:3.1 cm[3.1 - 3.7] RIGHT VENTRICLE ST Junction:nm* [2.6 - 3.2]RV Base:nm* [ <4.2] Asc.Aorta:nm* [2.6 - 3.4] RV Mid:nm* [ <3.5]  LEFT VENTRICLERV Length:2.5 cm[ <8.6] LVIDd:5.2 cm[4.2 - 5.9] INFERIOR VENA CAVA LVIDs:4.0 cmMax. IVC:nm* [ <=2.1]  FS:22.9 %[>25] Min. IVC:nm* SWT:1.1 cm[0.6 - 1.0] ------------------ PWT:1.1 cm[0.6 - 1.0] nm* - not measured  LEFT ATRIUM LA Diam:4.2 cm[3.0 - 4.0] LA A4C Area:nm* [ <20] LA Volume:nm* Roice.Felt - 58] ___________________________________________________________________________________________  ECHOCARDIOGRAPHIC DESCRIPTIONS  AORTIC ROOT  Size:Normal  Dissection:INDETERM FOR DISSECTION  AORTIC VALVE  Leaflets:TricuspidMorphology:MILDLY THICKENED  Mobility:Fully mobile  LEFT VENTRICLE  Size:Normal Anterior:Normal Contraction:REGIONALLY IMPAIRED Lateral:Normal  Closest EF:45% (Estimated)Septal:HYPOCONTRACTILE LV Masses:No MassesApical:HYPOCONTRACTILE WNU:UVOZ LVH Inferior:Normal Posterior:Normal  Dias.FxClass:N/A  MITRAL VALVE  Leaflets:Normal Mobility:Fully mobile  Morphology:THICKENED LEAFLET(S)  LEFT ATRIUM  Size:MILDLY  ENLARGED LA Masses:No masses IA Septum:Normal IAS  MAIN PA  Size:Normal  PULMONIC VALVE  Morphology:Normal Mobility:Fully mobile  RIGHT VENTRICLE RV Masses:No MassesSize:Normal Free Wall:NormalContraction:Normal  TRICUSPID VALVE  Leaflets:Normal Mobility:Fully mobile  Morphology:Normal  RIGHT ATRIUM  Size:Normal RA Other:None RA Mass:No masses  PERICARDIUM Fluid:No effusion  INFERIOR VENACAVA  Size:Normal Normal respiratory collapse  ___________________________________________________________________________________________  DOPPLER ECHO and OTHER SPECIAL PROCEDURES  Aortic:MILD AR No AS 136.3 cm/sec peak vel 7.4 mmHg peak grad   Mitral:MILD MR No MS 5.6 cm^2 by DOPPLER MV Inflow E Vel = 112.0 cm/secMV Annulus E'Vel = 4.9 cm/sec E/E'Ratio = 22.9  Tricuspid:TRIVIAL TRNo TS  Pulmonary:MILD PR No PS 93.7 cm/sec peak vel3.5 mmHg peak grad    ___________________________________________________________________________________________ INTERPRETATION MILD LV SYSTOLIC DYSFUNCTION (See above) WITH MILD LVH NORMAL RIGHT VENTRICULAR SYSTOLIC FUNCTION MILD VALVULAR REGURGITATION (See above) NO VALVULAR STENOSIS MILD AR, MR, PR TRIVIAL TR EF 40-45%   ___________________________________________________________________________________________  Electronically signed by: MD [name] on 02/07/2017 10:10 AM Performed By: Francee Piccolo Ordering Physician: [name] ___________________________________________________________________________________________  Other Result Information  Interface, Text Results In - 02/07/2017 10:10 AM EST                    CARDIOLOGY DEPARTMENT                   Erline Hau R  [facility]                      [id number]                   [place] #: [id number]         [address] [name], [place] [address]       Date: 02/04/2017 10:03 AM                                                            Adult Male Age: 77 yrs                    ECHOCARDIOGRAM REPORT                   Outpatient                                                            [facility]           STUDY:CHEST WALL                       TAPE:     MD1:  [name] JAY            ECHO:Yes   DOPPLER:Yes                FILE:     BP:120/80 mmHg           COLOR:Yes  CONTRAST:No      MACHINE:Philips      HR:       RV BIOPSY:No         3D:No   SOUND QLTY:Moderate     Height: 72 in          MEDIUM:None                                       Weight:243 lb                                                            BSA: 2.3 m2  ___________________________________________________________________________________________      HISTORY:DOE       REASON:Assess, LV function   INDICATION:Coronary artery disease involving native coronary artery of nati ___________________________________________________________________________________________  ECHOCARDIOGRAPHIC MEASUREMENTS 2D DIMENSIONS AORTA             Values      Normal Range      MAIN PA          Values      Normal Range           Annulus:  2.0 cm    [2.3 - 2.9]  PA Main:  nm*       [1.5 - 2.1]         Aorta Sin:  3.1 cm    [3.1 - 3.7]       RIGHT VENTRICLE       ST Junction:  nm*       [2.6 - 3.2]                RV Base:  nm*       [ <4.2]         Asc.Aorta:  nm*       [2.6 - 3.4]                  RV Mid:  nm*       [ <3.5]  LEFT VENTRICLE                                        RV Length:  2.5 cm    [ <8.6]             LVIDd:  5.2 cm    [4.2 - 5.9]       INFERIOR VENA CAVA             LVIDs:  4.0 cm                              Max. IVC:  nm*       [ <=2.1]                FS:  22.9 %    [>25]                     Min. IVC:  nm*               SWT:  1.1 cm    [0.6 - 1.0]                   ------------------               PWT:  1.1 cm    [0.6 - 1.0]                   nm* - not measured  LEFT ATRIUM           LA Diam:  4.2 cm    [3.0 - 4.0]       LA A4C Area:  nm*       [ <20]         LA Volume:  nm*       [18 - 58] ___________________________________________________________________________________________  ECHOCARDIOGRAPHIC DESCRIPTIONS  AORTIC ROOT          Size:Normal    Dissection:INDETERM FOR DISSECTION  AORTIC VALVE      Leaflets:Tricuspid                        Morphology:MILDLY THICKENED      Mobility:Fully mobile  LEFT VENTRICLE          Size:Normal                             Anterior:Normal   Contraction:REGIONALLY IMPAIRED  Lateral:Normal    Closest EF:45% (Estimated)                      Septal:HYPOCONTRACTILE     LV Masses:No Masses                            Apical:HYPOCONTRACTILE           OVF:IEPP LVH                           Inferior:Normal                                                 Posterior:Normal  Dias.FxClass:N/A  MITRAL VALVE      Leaflets:Normal                             Mobility:Fully mobile    Morphology:THICKENED LEAFLET(S)  LEFT ATRIUM          Size:MILDLY ENLARGED                   LA Masses:No masses     IA Septum:Normal IAS  MAIN PA          Size:Normal  PULMONIC VALVE    Morphology:Normal                             Mobility:Fully mobile  RIGHT VENTRICLE     RV Masses:No Masses                              Size:Normal     Free Wall:Normal                          Contraction:Normal  TRICUSPID  VALVE      Leaflets:Normal                             Mobility:Fully mobile    Morphology:Normal  RIGHT ATRIUM          Size:Normal                             RA Other:None       RA Mass:No masses  PERICARDIUM         Fluid:No effusion  INFERIOR VENACAVA          Size:Normal Normal respiratory collapse  ___________________________________________________________________________________________  DOPPLER ECHO and OTHER SPECIAL PROCEDURES          Aortic:MILD AR                                 No AS                 136.3 cm/sec peak vel                   7.4 mmHg peak grad           Mitral:MILD MR  No MS                                                         5.6 cm^2 by DOPPLER                 MV Inflow E Vel = 112.0 cm/sec          MV Annulus E'Vel = 4.9 cm/sec                 E/E'Ratio = 22.9        Tricuspid:TRIVIAL TR                              No TS        Pulmonary:MILD PR                                 No PS                 93.7 cm/sec peak vel                    3.5 mmHg peak grad    ___________________________________________________________________________________________ INTERPRETATION MILD LV SYSTOLIC DYSFUNCTION (See above)   WITH MILD LVH NORMAL RIGHT VENTRICULAR SYSTOLIC FUNCTION MILD VALVULAR REGURGITATION (See above) NO VALVULAR STENOSIS MILD AR, MR, PR TRIVIAL TR EF 40-45%   ___________________________________________________________________________________________  Electronically signed by: MD [name] on 02/07/2017 10:10 AM             Performed By: Francee Piccolo       Ordering Physician: [name] ___________________________________________________________________________________________  Status Results Details

## 2017-05-05 NOTE — Patient Instructions (Signed)
Your procedure is scheduled on: Tuesday 05/13/17 Report to [place]. To find out your arrival time please call [phone] between 1PM - 3PM on Monday 05/12/17.  Remember: Instructions that are not followed completely may result in serious medical risk, up to and including death, or upon the discretion of your surgeon and anesthesiologist your surgery may need to be rescheduled.     _X__ 1. Do not eat food after midnight the night before your procedure.                 No gum chewing or hard candies. You may drink clear liquids up to 2 hours                 before you are scheduled to arrive for your surgery- DO not drink clear                 liquids within 2 hours of the start of your surgery.                 Clear Liquids include:  water, apple juice without pulp, clear carbohydrate                 drink such as Clearfast or Gatorade, Black Coffee or Tea (Do not add                 anything to coffee or tea).  __X__2.  On the morning of surgery brush your teeth with toothpaste and water, you                 may rinse your mouth with mouthwash if you wish.  Do not swallow any              toothpaste of mouthwash.     _X__ 3.  No Alcohol for 24 hours before or after surgery.   _X__ 4.  Do Not Smoke or use e-cigarettes For 24 Hours Prior to Your Surgery.                 Do not use any chewable tobacco products for at least 6 hours prior to                 surgery.  ____  5.  Bring all medications with you on the day of surgery if instructed.   __X__  6.  Notify your doctor if there is any change in your medical condition      (cold, fever, infections).     Do not wear jewelry, make-up, hairpins, clips or nail polish. Do not wear lotions, powders, or perfumes.  Do not shave 48 hours prior to surgery. Men may shave face and neck. Do not bring valuables to the hospital.    [facility] is not responsible for any belongings or  valuables.  Contacts, dentures/partials or body piercings may not be worn into surgery. Bring a case for your contacts, glasses or hearing aids, a denture cup will be supplied. Leave your suitcase in the car. After surgery it may be brought to your room. For patients admitted to the hospital, discharge time is determined by your treatment team.   Patients discharged the day of surgery will not be allowed to drive home.   Please read over the following fact sheets that you were given:   MRSA Information  __X__ Take these medicines the morning of surgery with A SIP OF WATER:  1. LEVOTHYROXINE  2. OMEPRAZOLE  3.   4.  5.  6.  ____ Fleet Enema (as directed)   __X__ Use CHG Soap/SAGE wipes as directed  ____ Use inhalers on the day of surgery  __X__ Stop metformin/Janumet/Farxiga 2 days prior to surgery    __X__ Take 1/2 of usual insulin dose the night before surgery. No insulin the morning          of surgery.   ____ Stop Blood Thinners Coumadin/Plavix/Xarelto/Pleta/Pradaxa/Eliquis/Effient/Aspirin  on   Or contact your Surgeon, Cardiologist or Medical Doctor regarding  ability to stop your blood thinners  __X__ Stop Anti-inflammatories 7 days before surgery such as Advil, Ibuprofen, Motrin,  BC or Goodies Powder, Naprosyn, Naproxen, Aleve, Aspirin YOU MAY TAKE TYLENOL   __X__ Stopall herbal supplements, fish oil or vitamin E until after surgery.    ____ Bring C-Pap to the hospital.

## 2017-05-05 NOTE — Pre-Procedure Instructions (Signed)
Dr. Nicholaus Bloom office faxed w/ labs: CBC, BMP.

## 2017-05-13 ENCOUNTER — Ambulatory Visit
Admission: RE | Admit: 2017-05-13 | Discharge: 2017-05-13 | Disposition: A | Discharge status: Home / Self Care | Payer: Medicare Other | Source: Ambulatory Visit | Attending: Surgery | Admitting: Surgery

## 2017-05-13 ENCOUNTER — Ambulatory Visit: Payer: Medicare Other | Admitting: Anesthesiology

## 2017-05-13 ENCOUNTER — Encounter
Admission: RE | Disposition: A | Discharge status: Home / Self Care | Payer: Self-pay | Source: Ambulatory Visit | Attending: Surgery

## 2017-05-13 ENCOUNTER — Encounter: Payer: Self-pay | Admitting: *Deleted

## 2017-05-13 DIAGNOSIS — E119 Type 2 diabetes mellitus without complications: Secondary | ICD-10-CM | POA: Insufficient documentation

## 2017-05-13 DIAGNOSIS — Z7982 Long term (current) use of aspirin: Secondary | ICD-10-CM | POA: Insufficient documentation

## 2017-05-13 DIAGNOSIS — K219 Gastro-esophageal reflux disease without esophagitis: Secondary | ICD-10-CM | POA: Diagnosis not present

## 2017-05-13 DIAGNOSIS — M7542 Impingement syndrome of left shoulder: Secondary | ICD-10-CM | POA: Insufficient documentation

## 2017-05-13 DIAGNOSIS — I252 Old myocardial infarction: Secondary | ICD-10-CM | POA: Diagnosis not present

## 2017-05-13 DIAGNOSIS — Z79899 Other long term (current) drug therapy: Secondary | ICD-10-CM | POA: Diagnosis not present

## 2017-05-13 DIAGNOSIS — E785 Hyperlipidemia, unspecified: Secondary | ICD-10-CM | POA: Diagnosis not present

## 2017-05-13 DIAGNOSIS — M75122 Complete rotator cuff tear or rupture of left shoulder, not specified as traumatic: Secondary | ICD-10-CM | POA: Insufficient documentation

## 2017-05-13 DIAGNOSIS — I110 Hypertensive heart disease with heart failure: Secondary | ICD-10-CM | POA: Diagnosis not present

## 2017-05-13 DIAGNOSIS — Z87891 Personal history of nicotine dependence: Secondary | ICD-10-CM | POA: Diagnosis not present

## 2017-05-13 DIAGNOSIS — E039 Hypothyroidism, unspecified: Secondary | ICD-10-CM | POA: Insufficient documentation

## 2017-05-13 DIAGNOSIS — M75102 Unspecified rotator cuff tear or rupture of left shoulder, not specified as traumatic: Secondary | ICD-10-CM | POA: Diagnosis present

## 2017-05-13 DIAGNOSIS — I509 Heart failure, unspecified: Secondary | ICD-10-CM | POA: Insufficient documentation

## 2017-05-13 DIAGNOSIS — M7522 Bicipital tendinitis, left shoulder: Secondary | ICD-10-CM | POA: Insufficient documentation

## 2017-05-13 DIAGNOSIS — Z794 Long term (current) use of insulin: Secondary | ICD-10-CM | POA: Insufficient documentation

## 2017-05-13 HISTORY — PX: BICEPT TENODESIS: SHX5116

## 2017-05-13 HISTORY — PX: SHOULDER ARTHROSCOPY WITH OPEN ROTATOR CUFF REPAIR: SHX6092

## 2017-05-13 LAB — GLUCOSE, CAPILLARY
Glucose-Capillary: 89 mg/dL (ref 65–99)
Glucose-Capillary: 107 mg/dL — ABNORMAL HIGH (ref 65–99)

## 2017-05-13 SURGERY — ARTHROSCOPY, SHOULDER WITH REPAIR, ROTATOR CUFF, OPEN
Anesthesia: General | Site: Shoulder | Laterality: Left | Wound class: Clean

## 2017-05-13 MED ORDER — DEXAMETHASONE SODIUM PHOSPHATE 10 MG/ML IJ SOLN
INTRAMUSCULAR | Status: DC | PRN
  Administered 2017-05-13: 10 mg via INTRAVENOUS

## 2017-05-13 MED ORDER — MIDAZOLAM HCL 2 MG/2ML IJ SOLN
INTRAMUSCULAR | Status: AC
  Filled 2017-05-13: qty 2

## 2017-05-13 MED ORDER — ONDANSETRON HCL 4 MG/2ML IJ SOLN
INTRAMUSCULAR | Status: DC | PRN
  Administered 2017-05-13: 4 mg via INTRAVENOUS

## 2017-05-13 MED ORDER — DEXAMETHASONE SODIUM PHOSPHATE 10 MG/ML IJ SOLN
INTRAMUSCULAR | Status: AC
  Filled 2017-05-13: qty 1

## 2017-05-13 MED ORDER — FENTANYL CITRATE (PF) 100 MCG/2ML IJ SOLN
INTRAMUSCULAR | Status: AC
  Filled 2017-05-13: qty 2

## 2017-05-13 MED ORDER — LIDOCAINE HCL (CARDIAC) 20 MG/ML IV SOLN
INTRAVENOUS | Status: DC | PRN
  Administered 2017-05-13: 60 mg via INTRAVENOUS

## 2017-05-13 MED ORDER — MIDAZOLAM HCL 2 MG/2ML IJ SOLN: 1.0000 mg | Freq: Once | INTRAMUSCULAR | Status: DC

## 2017-05-13 MED ORDER — SODIUM CHLORIDE 0.9 % IJ SOLN
INTRAMUSCULAR | Status: AC
  Filled 2017-05-13: qty 10

## 2017-05-13 MED ORDER — ACETAMINOPHEN 10 MG/ML IV SOLN
INTRAVENOUS | Status: AC
  Filled 2017-05-13: qty 100

## 2017-05-13 MED ORDER — MIDAZOLAM HCL 2 MG/2ML IJ SOLN
INTRAMUSCULAR | Status: DC | PRN
  Administered 2017-05-13: 1 mg via INTRAVENOUS

## 2017-05-13 MED ORDER — ONDANSETRON HCL 4 MG/2ML IJ SOLN
INTRAMUSCULAR | Status: AC
  Filled 2017-05-13: qty 2

## 2017-05-13 MED ORDER — PHENYLEPHRINE HCL 10 MG/ML IJ SOLN
INTRAMUSCULAR | Status: AC
  Filled 2017-05-13: qty 1

## 2017-05-13 MED ORDER — PROPOFOL 10 MG/ML IV BOLUS
INTRAVENOUS | Status: AC
  Filled 2017-05-13: qty 20

## 2017-05-13 MED ORDER — FENTANYL CITRATE (PF) 100 MCG/2ML IJ SOLN
INTRAMUSCULAR | Status: DC | PRN
  Administered 2017-05-13 (×2): 50 ug via INTRAVENOUS

## 2017-05-13 MED ORDER — LACTATED RINGERS IV SOLN
INTRAVENOUS | Status: DC | PRN
  Administered 2017-05-13: 2 mL

## 2017-05-13 MED ORDER — ONDANSETRON HCL 4 MG/2ML IJ SOLN: 4.0000 mg | Freq: Once | INTRAMUSCULAR | Status: DC | PRN

## 2017-05-13 MED ORDER — BUPIVACAINE-EPINEPHRINE 0.5% -1:200000 IJ SOLN
INTRAMUSCULAR | Status: DC | PRN
  Administered 2017-05-13: 30 mL

## 2017-05-13 MED ORDER — ROCURONIUM BROMIDE 100 MG/10ML IV SOLN
INTRAVENOUS | Status: DC | PRN
  Administered 2017-05-13: 45 mg via INTRAVENOUS

## 2017-05-13 MED ORDER — FENTANYL CITRATE (PF) 100 MCG/2ML IJ SOLN: 25.0000 ug | INTRAMUSCULAR | Status: DC | PRN

## 2017-05-13 MED ORDER — MIDAZOLAM HCL 2 MG/2ML IJ SOLN
2.0000 mg | Freq: Once | INTRAMUSCULAR | Status: AC
  Administered 2017-05-13: 2 mg via INTRAVENOUS

## 2017-05-13 MED ORDER — EPINEPHRINE PF 1 MG/ML IJ SOLN
INTRAMUSCULAR | Status: AC
  Filled 2017-05-13: qty 2

## 2017-05-13 MED ORDER — EPHEDRINE SULFATE 50 MG/ML IJ SOLN
INTRAMUSCULAR | Status: AC
  Filled 2017-05-13: qty 1

## 2017-05-13 MED ORDER — PROPOFOL 10 MG/ML IV BOLUS
INTRAVENOUS | Status: DC | PRN
  Administered 2017-05-13: 180 mg via INTRAVENOUS

## 2017-05-13 MED ORDER — BUPIVACAINE LIPOSOME 1.3 % IJ SUSP
INTRAMUSCULAR | Status: AC
  Filled 2017-05-13: qty 20

## 2017-05-13 MED ORDER — ONDANSETRON 4 MG PO TBDP: 4.0000 mg | ORAL_TABLET | Freq: Three times a day (TID) | ORAL | 1 refills | Status: DC | PRN

## 2017-05-13 MED ORDER — SUCCINYLCHOLINE CHLORIDE 20 MG/ML IJ SOLN
INTRAMUSCULAR | Status: AC
  Filled 2017-05-13: qty 1

## 2017-05-13 MED ORDER — OXYCODONE HCL 5 MG PO TABS
5.0000 mg | ORAL_TABLET | ORAL | 0 refills | Status: DC | PRN
Start: 2017-05-13 — End: 2017-10-24

## 2017-05-13 MED ORDER — LIDOCAINE HCL (PF) 2 % IJ SOLN
INTRAMUSCULAR | Status: AC
  Filled 2017-05-13: qty 10

## 2017-05-13 MED ORDER — BUPIVACAINE HCL (PF) 0.5 % IJ SOLN
INTRAMUSCULAR | Status: AC
  Filled 2017-05-13: qty 10

## 2017-05-13 MED ORDER — CEFAZOLIN SODIUM-DEXTROSE 2-4 GM/100ML-% IV SOLN
INTRAVENOUS | Status: AC
  Filled 2017-05-13: qty 100

## 2017-05-13 MED ORDER — BUPIVACAINE-EPINEPHRINE (PF) 0.5% -1:200000 IJ SOLN
INTRAMUSCULAR | Status: AC
  Filled 2017-05-13: qty 30

## 2017-05-13 MED ORDER — CEFAZOLIN SODIUM-DEXTROSE 2-4 GM/100ML-% IV SOLN
2.0000 g | Freq: Once | INTRAVENOUS | Status: AC
  Administered 2017-05-13: 2 g via INTRAVENOUS

## 2017-05-13 MED ORDER — LACTATED RINGERS IV SOLN
INTRAVENOUS | Status: DC | PRN
  Administered 2017-05-13: 09:00:00 via INTRAVENOUS

## 2017-05-13 MED ORDER — SODIUM CHLORIDE 0.9 % IV SOLN
INTRAVENOUS | Status: DC
  Administered 2017-05-13: 08:00:00 via INTRAVENOUS

## 2017-05-13 MED ORDER — MIDAZOLAM HCL 2 MG/2ML IJ SOLN
INTRAMUSCULAR | Status: AC
  Administered 2017-05-13: 2 mg via INTRAVENOUS
  Filled 2017-05-13: qty 2

## 2017-05-13 MED ORDER — SODIUM CHLORIDE 0.9 % IV SOLN
INTRAVENOUS | Status: DC | PRN
  Administered 2017-05-13: 25 ug/min via INTRAVENOUS

## 2017-05-13 MED ORDER — LIDOCAINE HCL (PF) 1 % IJ SOLN
INTRAMUSCULAR | Status: AC
  Filled 2017-05-13: qty 5

## 2017-05-13 SURGICAL SUPPLY — 42 items
ANCHOR JUGGERKNOT WTAP NDL 2.9 (Anchor) ×8 IMPLANT
ANCHOR SUT QUATTRO KNTLS 4.5 (Anchor) ×4 IMPLANT
BIT DRILL JUGRKNT W/NDL BIT2.9 (DRILL) ×1 IMPLANT
BLADE FULL RADIUS 3.5 (BLADE) ×2 IMPLANT
BNDG GAUZE 4.5X4.1 6PLY STRL (MISCELLANEOUS) ×2 IMPLANT
BUR ACROMIONIZER 4.0 (BURR) ×2 IMPLANT
CANNULA SHAVER 8MMX76MM (CANNULA) ×2 IMPLANT
CHLORAPREP W/TINT 26ML (MISCELLANEOUS) ×2 IMPLANT
COVER MAYO STAND STRL (DRAPES) ×2 IMPLANT
DRAPE IMP U-DRAPE 54X76 (DRAPES) ×4 IMPLANT
DRILL JUGGERKNOT W/NDL BIT 2.9 (DRILL) ×2
ELECT REM PT RETURN 9FT ADLT (ELECTROSURGICAL) ×2
ELECTRODE REM PT RTRN 9FT ADLT (ELECTROSURGICAL) ×1 IMPLANT
GAUZE PETRO XEROFOAM 1X8 (MISCELLANEOUS) ×2 IMPLANT
GAUZE SPONGE 4X4 12PLY STRL (GAUZE/BANDAGES/DRESSINGS) ×2 IMPLANT
GLOVE BIO SURGEON STRL SZ7.5 (GLOVE) ×4 IMPLANT
GLOVE BIO SURGEON STRL SZ8 (GLOVE) ×4 IMPLANT
GLOVE BIOGEL PI IND STRL 8 (GLOVE) ×1 IMPLANT
GLOVE BIOGEL PI INDICATOR 8 (GLOVE) ×1
GLOVE INDICATOR 8.0 STRL GRN (GLOVE) ×2 IMPLANT
GOWN STRL REUS W/ TWL LRG LVL3 (GOWN DISPOSABLE) ×1 IMPLANT
GOWN STRL REUS W/ TWL XL LVL3 (GOWN DISPOSABLE) ×1 IMPLANT
GOWN STRL REUS W/TWL LRG LVL3 (GOWN DISPOSABLE) ×1
GOWN STRL REUS W/TWL XL LVL3 (GOWN DISPOSABLE) ×1
GRASPER SUT 15 45D LOW PRO (SUTURE) IMPLANT
IV LACTATED RINGER IRRG 3000ML (IV SOLUTION) ×2
IV LR IRRIG 3000ML ARTHROMATIC (IV SOLUTION) ×2 IMPLANT
MANIFOLD NEPTUNE II (INSTRUMENTS) ×2 IMPLANT
MASK FACE SPIDER DISP (MASK) ×2 IMPLANT
MAT BLUE FLOOR 46X72 FLO (MISCELLANEOUS) ×2 IMPLANT
PACK ARTHROSCOPY SHOULDER (MISCELLANEOUS) ×2 IMPLANT
SLING ARM LRG DEEP (SOFTGOODS) IMPLANT
SLING ULTRA II LG (MISCELLANEOUS) ×2 IMPLANT
STAPLER SKIN PROX 35W (STAPLE) ×2 IMPLANT
STRAP SAFETY 5IN WIDE (MISCELLANEOUS) ×4 IMPLANT
SUT ETHIBOND 0 MO6 C/R (SUTURE) ×2 IMPLANT
SUT VIC AB 2-0 CT1 27 (SUTURE) ×2
SUT VIC AB 2-0 CT1 TAPERPNT 27 (SUTURE) ×2 IMPLANT
TAPE MICROFOAM 4IN (TAPE) ×2 IMPLANT
TUBING ARTHRO INFLOW-ONLY STRL (TUBING) ×2 IMPLANT
TUBING CONNECTING 10 (TUBING) ×2 IMPLANT
WAND HAND CNTRL MULTIVAC 90 (MISCELLANEOUS) ×2 IMPLANT

## 2017-05-13 NOTE — Op Note (Signed)
05/13/2017  11:05 AM  Patient:   [name] [name]  Pre-Op Diagnosis:   Impingement/tendinopathy with rotator cuff tear, left shoulder.  Post-Op Diagnosis: Impingement/tendinopathy with rotator cuff tear, labral fraying, and biceps tendinopathy, left shoulder.  Procedure: Limited arthroscopic debridement, arthroscopic subacromial decompression, mini-open rotator cuff repair, and mini-open biceps tenodesis, left shoulder.  Anesthesia: General endotracheal with interscalene block placed preoperatively by the anesthesiologist.  Surgeon:   Pascal Lux, MD  Assistant:   Desmond Dike, RNFA  Findings: As above.  There were grade 2 chondromalacial changes involving the glenoid.  There were areas of significant labral fraying anteriorly and superiorly without frank detachment from the glenoid.  There was a full-thickness tear involving the supraspinatus and anterior portion of the infraspinatus tendons.  The subscapularis and teres minor tendons were in excellent condition.  Complications: None  Fluids:   800 cc  Estimated blood loss: 5 cc  Tourniquet time: None  Drains: None  Closure: Staples   Brief clinical note: The patient is a 77 year old male with a history of left shoulder pain. The patient's symptoms have progressed despite medications, activity modification, etc. The patient's history and examination are consistent with impingement/tendinopathy with a rotator cuff tear. These findings were confirmed by MRI scan. The patient presents at this time for definitive management of these shoulder symptoms.  Procedure: The patient underwent placement of an interscalene block by the anesthesiologist in the preoperative holding area before being brought into the operating room and lain in the supine position. The patient then underwent general endotracheal intubation and anesthesia before being repositioned in the beach chair position using the beach chair positioner. The  left shoulder and upper extremity were prepped with ChloraPrep solution before being draped sterilely. Preoperative antibiotics were administered. A timeout was performed to confirm the proper surgical site before the expected portal sites and incision site were injected with 0.5% Sensorcaine with epinephrine. A posterior portal was created and the glenohumeral joint thoroughly inspected with the findings as described above. An anterior portal was created using an outside-in technique. The labrum and rotator cuff were further probed, again confirming the above-noted findings. Areas of labral fraying were debrided using the full-radius resector, as were areas of synovitis. The torn margin of the rotator cuff tear also was debrided using the full-radius resector. The ArthroCare wand was inserted and used to release the biceps tendon from its labral anchor.  It also was used to obtain hemostasis as well as to "anneal" the labrum superiorly and anteriorly. The instruments were removed from the joint after suctioning the excess fluid.  The camera was repositioned through the posterior portal into the subacromial space. A separate lateral portal was created using an outside-in technique. The 3.5 mm full-radius resector was introduced and used to perform a subtotal bursectomy. The ArthroCare wand was then inserted and used to remove the periosteal tissue off the undersurface of the anterior third of the acromion as well as to recess the coracoacromial ligament from its attachment along the anterior and lateral margins of the acromion. The 4.0 mm acromionizing bur was introduced and used to complete the decompression by removing the undersurface of the anterior third of the acromion. The full radius resector was reintroduced to remove any residual bony debris before the ArthroCare wand was reintroduced to obtain hemostasis. The instruments were then removed from the subacromial space after suctioning the excess  fluid.  An approximately 4-5 cm incision was made over the anterolateral aspect of the shoulder beginning at the anterolateral corner of  the acromion and extending distally in line with the bicipital groove. This incision was carried down through the subcutaneous tissues to expose the deltoid fascia. The raphae between the anterior and middle thirds was identified and this plane developed to provide access into the subacromial space. Additional bursal tissues were debrided sharply using Metzenbaum scissors. The rotator cuff tear was readily identified. The margins were debrided sharply with a #15 blade and the exposed greater tuberosity roughened with a rongeur. The tear was repaired using three Biomet 2.9 mm JuggerKnot anchors. These sutures were then brought back laterally and secured using two [place] QuatroLink anchors to create a two-layer closure. An apparent watertight closure was obtained.  The bicipital groove was identified by palpation and opened for 1-1.5 cm. The biceps tendon stump was retrieved through this defect. The floor of the bicipital groove was roughened with a curet before another Biomet 2.9 mm JuggerKnot anchor was inserted. Both sets of sutures were passed through the biceps tendon and tied securely to effect the tenodesis. The bicipital sheath was reapproximated using two #0 Ethibond interrupted sutures, incorporating the biceps tendon to further reinforce the tenodesis.  The wound was copiously irrigated with sterile saline solution before the deltoid raphae was reapproximated using 2-0 Vicryl interrupted sutures. The subcutaneous tissues were closed in two layers using 2-0 Vicryl interrupted sutures before the skin was closed using staples. The portal sites also were closed using staples. A sterile bulky dressing was applied to the shoulder before the arm was placed into a shoulder immobilizer. The patient was then awakened, extubated, and returned to the recovery room in  satisfactory condition after tolerating the procedure well.

## 2017-05-13 NOTE — H&P (Signed)
Paper H&P to be scanned into permanent record. H&P reviewed and patient re-examined. No changes. 

## 2017-05-13 NOTE — Transfer of Care (Signed)
Immediate Anesthesia Transfer of Care Note  Patient: [name]  Procedure(s) Performed: SHOULDER ARTHROSCOPY WITH OPEN ROTATOR CUFF REPAIR (Left Shoulder) BICEPS TENODESIS (Left Shoulder)  Patient Location: PACU  Anesthesia Type:General  Level of Consciousness: awake  Airway & Oxygen Therapy: Patient Spontanous Breathing  Post-op Assessment: Report given to RN  Post vital signs: stable  Last Vitals:  Vitals:   05/13/17 0833 05/13/17 0841  BP: 116/61 121/71  Pulse: 64 67  Resp: 15 (!) 21  Temp:    SpO2: 97% 99%    Last Pain:  Vitals:   05/13/17 0732  TempSrc: Tympanic      Patients Stated Pain Goal: 0 (00/76/22 6333)  Complications: No apparent anesthesia complications

## 2017-05-13 NOTE — Anesthesia Procedure Notes (Signed)
Anesthesia Regional Block: Interscalene brachial plexus block   Pre-Anesthetic Checklist: ,, timeout performed, Correct Patient, Correct Site, Correct Laterality, Correct Procedure, Correct Position, site marked, Risks and benefits discussed,  Surgical consent,  Pre-op evaluation,  At surgeon's request and post-op pain management   Prep: chloraprep       Needles:  Injection technique: Single-shot  Needle Type: Echogenic Stimulator Needle     Needle Length: 10cm  Needle Gauge: 20     Additional Needles:   Procedures:, nerve stimulator,,, ultrasound used (permanent image in chart),,,,   Nerve Stimulator or Paresthesia:  Response: biceps flexion,   Additional Responses:   Narrative:  Injection made incrementally with aspirations every 5 mL.  Performed by: Personally  Anesthesiologist: Molli Barrows, MD  Additional Notes: Functioning IV was confirmed and monitors were applied. Sterile prep and drape,hand hygiene and sterile gloves were used.  Negative aspiration and negative test dose prior to incremental administration of local anesthetic. The patient tolerated the procedure well.

## 2017-05-13 NOTE — Anesthesia Procedure Notes (Signed)
Procedure Name: Intubation Date/Time: 05/13/2017 8:47 AM Performed by: Carron Curie, CRNA Pre-anesthesia Checklist: Patient identified, Emergency Drugs available, Suction available, Patient being monitored and Timeout performed Patient Re-evaluated:Patient Re-evaluated prior to induction Oxygen Delivery Method: Circle system utilized Preoxygenation: Pre-oxygenation with 100% oxygen Induction Type: IV induction Ventilation: Mask ventilation without difficulty Laryngoscope Size: Mac and 3 Grade View: Grade I Tube type: Oral Tube size: 7.5 mm Number of attempts: 1 Airway Equipment and Method: Stylet Placement Confirmation: ETT inserted through vocal cords under direct vision and positive ETCO2 Secured at: 22 (at  gum) cm Tube secured with: Tape Dental Injury: Teeth and Oropharynx as per pre-operative assessment

## 2017-05-13 NOTE — Discharge Instructions (Addendum)
Keep dressing dry and intact.  May shower after dressing changed on post-op day #4 (Saturday).  Cover staples with Band-Aids after drying off. Apply ice frequently to shoulder. Take ibuprofen 600-800 mg TID with meals OR Aleve 2 tablets BID with meals for 7-10 days, then as necessary. Take oxycodone as prescribed when needed.  May supplement with ES Tylenol if necessary. Keep shoulder immobilizer on at all times except may remove for bathing purposes. Follow-up in 10-14 days or as scheduled.     Interscalene Nerve Block with Exparel  1.  For your surgery you have received an Interscalene Nerve Block with Exparel. 2. Nerve Blocks affect many types of nerves, including nerves that control movement, pain and normal sensation.  You may experience feelings such as numbness, tingling, heaviness, weakness or the inability to move your arm or the feeling or sensation that your arm has "fallen asleep". 3. A nerve block with Exparel can last up to 5 days.  Usually the weakness wears off first.  The tingling and heaviness usually wear off next.  Finally you may start to notice pain.  Keep in mind that this may occur in any order.  Once a nerve block starts to wear off it is usually completely gone within 60 minutes. 4. ISNB may cause mild shortness of breath, a hoarse voice, blurry vision, unequal pupils, or drooping of the face on the same side as the nerve block.  These symptoms will usually resolve with the numbness.  Very rarely the procedure itself can cause mild seizures. 5. If needed, your surgeon will give you a prescription for pain medication.  It will take about 60 minutes for the oral pain medication to become fully effective.  So, it is recommended that you start taking this medication before the nerve block first begins to wear off, or when you first begin to feel discomfort. 6. Take your pain medication only as prescribed.  Pain medication can cause sedation and decrease your breathing if you  take more than you need for the level of pain that you have. 7. Nausea is a common side effect of many pain medications.  You may want to eat something before taking your pain medicine to prevent nausea. 8. After an Interscalene nerve block, you cannot feel pain, pressure or extremes in temperature in the effected arm.  Because your arm is numb it is at an increased risk for injury.  To decrease the possibility of injury, please practice the following:  a. While you are awake change the position of your arm frequently to prevent too much pressure on any one area for prolonged periods of time. b.  If you have a cast or tight dressing, check the color or your fingers every couple of hours.  Call your surgeon with the appearance of any discoloration (white or blue). c. If you are given a sling to wear before you go home, please wear it  at all times until the block has completely worn off.  Do not get up at night without your sling. d. Please contact [facility] or your surgeon if you do not begin to regain sensation after 7 days from the surgery.  Anesthesia may be contacted by calling the Same Day Surgery Department, Mon. through Fri., 6 am to 4 pm at [phone].   e. If you experience any other problems or concerns, please contact your surgeon's office. f. If you experience severe or prolonged shortness of breath go to the nearest emergency department.  AMBULATORY SURGERY  DISCHARGE INSTRUCTIONS   1) The drugs that you were given will stay in your system until tomorrow so for the next 24 hours you should not:  A) Drive an automobile B) Make any legal decisions C) Drink any alcoholic beverage   2) You may resume regular meals tomorrow.  Today it is better to start with liquids and gradually work up to solid foods.  You may eat anything you prefer, but it is better to start with liquids, then soup and crackers, and gradually work up to solid foods.   3) Please notify your doctor  immediately if you have any unusual bleeding, trouble breathing, redness and pain at the surgery site, drainage, fever, or pain not relieved by medication.    4) Additional Instructions:        Please contact your physician with any problems or Same Day Surgery at [phone], Monday through Friday 6 am to 4 pm, or [place] at [facility] number at [phone].

## 2017-05-13 NOTE — Anesthesia Post-op Follow-up Note (Signed)
Anesthesia QCDR form completed.        

## 2017-05-13 NOTE — Anesthesia Preprocedure Evaluation (Signed)
Anesthesia Evaluation  Patient identified by MRN, date of birth, ID band Patient awake    Reviewed: Allergy & Precautions, H&P , NPO status , Patient's Chart, lab work & pertinent test results, reviewed documented beta blocker date and time   Airway Mallampati: III  TM Distance: >3 FB Neck ROM: full    Dental  (+) Upper Dentures, Lower Dentures   Pulmonary neg pulmonary ROS, former smoker,    Pulmonary exam normal        Cardiovascular Exercise Tolerance: Good hypertension, (-) angina+ CAD, + Past MI, + Cardiac Stents and + Peripheral Vascular Disease  negative cardio ROS Normal cardiovascular exam+ Valvular Problems/Murmurs  Rhythm:regular Rate:Normal     Neuro/Psych negative neurological ROS  negative psych ROS   GI/Hepatic negative GI ROS, Neg liver ROS, GERD  Medicated,  Endo/Other  negative endocrine ROSdiabetesHypothyroidism   Renal/GU Renal diseasenegative Renal ROS  negative genitourinary   Musculoskeletal   Abdominal   Peds  Hematology negative hematology ROS (+) anemia ,   Anesthesia Other Findings Past Medical History: No date: Anemia No date: Arthritis No date: CAD (coronary artery disease) No date: Chronic kidney disease     Comment:  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)     Comment:  Per Pt, MI in 1999. No date: PVD (peripheral vascular disease) ([place]) Past Surgical History: No date: BACK SURGERY No date: CORONARY ANGIOPLASTY No date: JOINT  REPLACEMENT; Bilateral     Comment:  knee No date: JOINT REPLACEMENT; Right     Comment:  hip No date: TONSILLECTOMY No date: VASECTOMY BMI    Body Mass Index:  32.55 kg/m     Reproductive/Obstetrics negative OB ROS                             Anesthesia Physical Anesthesia Plan  ASA: III  Anesthesia Plan: General ETT   Post-op Pain Management:  Regional for Post-op pain   Induction:   PONV Risk Score and Plan: 3  Airway Management Planned:   Additional Equipment:   Intra-op Plan:   Post-operative Plan:   Informed Consent: I have reviewed the patients History and Physical, chart, labs and discussed the procedure including the risks, benefits and alternatives for the proposed anesthesia with the patient or authorized representative who has indicated his/her understanding and acceptance.   Dental Advisory Given  Plan Discussed with: CRNA  Anesthesia Plan Comments:         Anesthesia Quick Evaluation

## 2017-05-20 NOTE — Anesthesia Postprocedure Evaluation (Signed)
Anesthesia Post Note  Patient: [name] [name]  Procedure(s) Performed: SHOULDER ARTHROSCOPY WITH OPEN ROTATOR CUFF REPAIR (Left Shoulder) BICEPS TENODESIS (Left Shoulder)  Patient location during evaluation: PACU Anesthesia Type: General Level of consciousness: awake and alert Pain management: pain level controlled Vital Signs Assessment: post-procedure vital signs reviewed and stable Respiratory status: spontaneous breathing, nonlabored ventilation, respiratory function stable and patient connected to nasal cannula oxygen Cardiovascular status: blood pressure returned to baseline and stable Postop Assessment: no apparent nausea or vomiting Anesthetic complications: no     Last Vitals:  Vitals:   05/13/17 1210 05/13/17 1315  BP: 108/65 (!) 110/98  Pulse: 71 72  Resp: 16 16  Temp: (!) 36.3 C   SpO2: 96% 96%    Last Pain:  Vitals:   05/14/17 0823  TempSrc:   PainSc: 0-No pain                 Molli Barrows

## 2017-10-17 ENCOUNTER — Encounter: Payer: Self-pay | Admitting: Emergency Medicine

## 2017-10-17 ENCOUNTER — Other Ambulatory Visit: Payer: Self-pay

## 2017-10-17 ENCOUNTER — Emergency Department: Payer: Medicare Other

## 2017-10-17 ENCOUNTER — Emergency Department
Admission: EM | Admit: 2017-10-17 | Discharge: 2017-10-18 | Disposition: A | Discharge status: Home / Self Care | Payer: Medicare Other | Source: Home / Self Care | Attending: Emergency Medicine | Admitting: Emergency Medicine

## 2017-10-17 DIAGNOSIS — R911 Solitary pulmonary nodule: Secondary | ICD-10-CM

## 2017-10-17 DIAGNOSIS — Z794 Long term (current) use of insulin: Secondary | ICD-10-CM | POA: Insufficient documentation

## 2017-10-17 DIAGNOSIS — I77811 Abdominal aortic ectasia: Secondary | ICD-10-CM

## 2017-10-17 DIAGNOSIS — K859 Acute pancreatitis without necrosis or infection, unspecified: Secondary | ICD-10-CM | POA: Insufficient documentation

## 2017-10-17 DIAGNOSIS — Z79899 Other long term (current) drug therapy: Secondary | ICD-10-CM | POA: Insufficient documentation

## 2017-10-17 DIAGNOSIS — I712 Thoracic aortic aneurysm, without rupture, unspecified: Secondary | ICD-10-CM

## 2017-10-17 DIAGNOSIS — N183 Chronic kidney disease, stage 3 (moderate): Secondary | ICD-10-CM

## 2017-10-17 DIAGNOSIS — E1122 Type 2 diabetes mellitus with diabetic chronic kidney disease: Secondary | ICD-10-CM

## 2017-10-17 DIAGNOSIS — Z7982 Long term (current) use of aspirin: Secondary | ICD-10-CM | POA: Insufficient documentation

## 2017-10-17 DIAGNOSIS — N179 Acute kidney failure, unspecified: Secondary | ICD-10-CM | POA: Diagnosis not present

## 2017-10-17 DIAGNOSIS — R1013 Epigastric pain: Secondary | ICD-10-CM

## 2017-10-17 DIAGNOSIS — I252 Old myocardial infarction: Secondary | ICD-10-CM

## 2017-10-17 DIAGNOSIS — I2510 Atherosclerotic heart disease of native coronary artery without angina pectoris: Secondary | ICD-10-CM | POA: Insufficient documentation

## 2017-10-17 DIAGNOSIS — Z96653 Presence of artificial knee joint, bilateral: Secondary | ICD-10-CM

## 2017-10-17 DIAGNOSIS — Z87891 Personal history of nicotine dependence: Secondary | ICD-10-CM | POA: Insufficient documentation

## 2017-10-17 DIAGNOSIS — K851 Biliary acute pancreatitis without necrosis or infection: Secondary | ICD-10-CM | POA: Diagnosis not present

## 2017-10-17 DIAGNOSIS — I129 Hypertensive chronic kidney disease with stage 1 through stage 4 chronic kidney disease, or unspecified chronic kidney disease: Secondary | ICD-10-CM

## 2017-10-17 LAB — COMPREHENSIVE METABOLIC PANEL
ALT: 57 U/L — AB (ref 0–44)
AST: 85 U/L — AB (ref 15–41)
Albumin: 4 g/dL (ref 3.5–5.0)
Alkaline Phosphatase: 62 U/L (ref 38–126)
Anion gap: 7 (ref 5–15)
BUN: 27 mg/dL — AB (ref 8–23)
CHLORIDE: 111 mmol/L (ref 98–111)
CO2: 23 mmol/L (ref 22–32)
CREATININE: 1.69 mg/dL — AB (ref 0.61–1.24)
Calcium: 8.8 mg/dL — ABNORMAL LOW (ref 8.9–10.3)
GFR calc Af Amer: 43 mL/min — ABNORMAL LOW (ref >60)
GFR, EST NON AFRICAN AMERICAN: 37 mL/min — AB (ref >60)
Glucose, Bld: 144 mg/dL — ABNORMAL HIGH (ref 70–99)
Potassium: 4.3 mmol/L (ref 3.5–5.1)
SODIUM: 141 mmol/L (ref 135–145)
Total Bilirubin: 0.6 mg/dL (ref 0.3–1.2)
Total Protein: 6.7 g/dL (ref 6.5–8.1)

## 2017-10-17 LAB — URINALYSIS, COMPLETE (UACMP) WITH MICROSCOPIC
BACTERIA UA: NONE SEEN
BILIRUBIN URINE: NEGATIVE
GLUCOSE, UA: NEGATIVE mg/dL
Hgb urine dipstick: NEGATIVE
Ketones, ur: NEGATIVE mg/dL
Leukocytes, UA: NEGATIVE
NITRITE: NEGATIVE
PROTEIN: 30 mg/dL — AB
Specific Gravity, Urine: 1.013 (ref 1.005–1.030)
Squamous Epithelial / LPF: NONE SEEN (ref 0–5)
pH: 5 (ref 5.0–8.0)

## 2017-10-17 LAB — CBC
HCT: 38.9 % — ABNORMAL LOW (ref 40.0–52.0)
Hemoglobin: 12.8 g/dL — ABNORMAL LOW (ref 13.0–18.0)
MCH: 30.7 pg (ref 26.0–34.0)
MCHC: 32.8 g/dL (ref 32.0–36.0)
MCV: 93.6 fL (ref 80.0–100.0)
PLATELETS: 215 10*3/uL (ref 150–440)
RBC: 4.16 MIL/uL — AB (ref 4.40–5.90)
RDW: 13.9 % (ref 11.5–14.5)
WBC: 15.2 10*3/uL — AB (ref 3.8–10.6)

## 2017-10-17 LAB — LIPASE, BLOOD: Lipase: 4365 U/L — ABNORMAL HIGH (ref 11–51)

## 2017-10-17 LAB — TROPONIN I
Troponin I: <0.03 ng/mL (ref <0.03)
Troponin I: <0.03 ng/mL (ref <0.03)

## 2017-10-17 MED ORDER — ONDANSETRON 4 MG PO TBDP: 4.0000 mg | ORAL_TABLET | Freq: Three times a day (TID) | ORAL | 0 refills | Status: DC | PRN

## 2017-10-17 MED ORDER — FAMOTIDINE IN NACL 20-0.9 MG/50ML-% IV SOLN
20.0000 mg | Freq: Once | INTRAVENOUS | Status: AC
  Administered 2017-10-17: 20 mg via INTRAVENOUS
  Filled 2017-10-17: qty 50

## 2017-10-17 MED ORDER — OXYCODONE-ACETAMINOPHEN 5-325 MG PO TABS: 1.0000 | ORAL_TABLET | ORAL | 0 refills | Status: DC | PRN

## 2017-10-17 MED ORDER — ONDANSETRON HCL 4 MG/2ML IJ SOLN
4.0000 mg | Freq: Once | INTRAMUSCULAR | Status: AC
  Administered 2017-10-17: 4 mg via INTRAVENOUS
  Filled 2017-10-17: qty 2

## 2017-10-17 MED ORDER — ONDANSETRON 4 MG PO TBDP
4.0000 mg | ORAL_TABLET | Freq: Once | ORAL | Status: AC
  Administered 2017-10-17: 4 mg via ORAL
  Filled 2017-10-17: qty 1

## 2017-10-17 MED ORDER — IOPAMIDOL (ISOVUE-300) INJECTION 61%
100.0000 mL | Freq: Once | INTRAVENOUS | Status: AC | PRN
  Administered 2017-10-17: 100 mL via INTRAVENOUS

## 2017-10-17 MED ORDER — OXYCODONE-ACETAMINOPHEN 5-325 MG PO TABS
1.0000 | ORAL_TABLET | Freq: Once | ORAL | Status: AC
  Administered 2017-10-17: 1 via ORAL
  Filled 2017-10-17: qty 1

## 2017-10-17 MED ORDER — MORPHINE SULFATE (PF) 4 MG/ML IV SOLN
4.0000 mg | Freq: Once | INTRAVENOUS | Status: AC
  Administered 2017-10-17: 4 mg via INTRAVENOUS
  Filled 2017-10-17: qty 1

## 2017-10-17 NOTE — ED Notes (Signed)
ED provider at bedside.

## 2017-10-17 NOTE — ED Triage Notes (Signed)
Pt presents from home via [facility] for chestpain. Pt states he was Eating seafood about 1 hour ago, experienced dull pressure pain after that radiates to left chest. Hx of MI with stent in 1999. Nausea, vomiting and diaphoretic for EMS. Hx of HTN, diabetes, a-fib. 98%, 18G right AC placed by EMS. Hypotensive for EMA :98/60. Pt states normal is usually 735-329 systolicaaly. 324 aspirin, 4 of zofran, 382mL of normal saline, and pt states he took 2 nitros at home. Pt denies any medication changes.

## 2017-10-17 NOTE — Discharge Instructions (Addendum)
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 Lucky Cowboy on Monday for an appointment  - left lung nodule - follow up with Dr. Doy Hutching

## 2017-10-17 NOTE — ED Provider Notes (Signed)
[facility] Emergency Department Provider Note  ____________________________________________  Time seen: Approximately 7:24 PM  I have reviewed the triage vital signs and the nursing notes.   HISTORY  Chief Complaint Chest Pain   HPI [name] [name] is a 77 y.o. male with history of CAD status post 2 stents, CKD, diabetes, GERD, hypertension, hyperlipidemia who presents for evaluation of chest pain.  Patient reports that he was eating fish for dinner when he developed sudden onset of dull epigastric pain.  The pain was 3 out of 10.  Patient became clammy and diaphoretic.  He had severe nausea.  No vomiting, the pain did not radiate, no fever chills, no shortness of breath, no dizziness.  No prior abdominal surgeries.  Patient took 1 full aspirin and 2 sublingual nitros with no change in the pain.  Continues to complain of 3 out of 10 pain at this time. Patient reports that pain is different from his heart attacks.  Past Medical History:  Diagnosis Date  . Anemia   . Arthritis   . CAD (coronary artery disease)   . Chronic kidney disease    CKD stage 3  . DDD (degenerative disc disease), thoracic   . Diabetes mellitus without complication ([place])   . GERD (gastroesophageal reflux disease)   . Heart murmur   . History of heart artery stent 2014   Per patient, [name] put in 2 stents in 2014.  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 Hyperlipidemia   . Hypertension   . Hypothyroidism   . Macular degeneration   . Myocardial infarction [facility]) 1999   Per Pt, MI in 1999.  [name] PVD (peripheral vascular disease) [facility])     Patient Active Problem List   Diagnosis Date Noted  . Chest pain 12/08/2015  . Bradycardia 12/08/2015  . Generalized weakness 12/08/2015  . Leukocytosis 12/08/2015  . Chronic renal insufficiency, stage 3 (moderate) ([place]) 12/08/2015    Past Surgical History:  Procedure Laterality Date  . BACK SURGERY    . BICEPT  TENODESIS Left 05/13/2017   Procedure: BICEPS TENODESIS;  Surgeon: [name], MD;  Location: [facility] ORS;  Service: Orthopedics;  Laterality: Left;  . CORONARY ANGIOPLASTY    . JOINT REPLACEMENT Bilateral    knee  . JOINT REPLACEMENT Right    hip  . SHOULDER ARTHROSCOPY WITH OPEN ROTATOR CUFF REPAIR Left 05/13/2017   Procedure: SHOULDER ARTHROSCOPY WITH OPEN ROTATOR CUFF REPAIR;  Surgeon: [name], MD;  Location: [facility] ORS;  Service: Orthopedics;  Laterality: Left;  subacromial decompression, debridement  . TONSILLECTOMY    . VASECTOMY      Prior to Admission medications   Medication Sig Start Date End Date Taking? Authorizing Provider  aspirin EC 81 MG tablet Take 81 mg by mouth daily.     [provider]  Cyanocobalamin (B-12) 2500 MCG TABS Take 2,500 mcg by mouth daily.    [provider]  cyclobenzaprine (FLEXERIL) 10 MG tablet Take 10 mg by mouth 3 (three) times daily as needed for muscle spasms.  08/17/14   [provider]  furosemide (LASIX) 20 MG tablet Take 20 mg by mouth daily as needed for edema.    [provider]  gabapentin (NEURONTIN) 300 MG capsule Take 300 mg by mouth at bedtime.  05/22/15   [provider]  Insulin Glargine (LANTUS SOLOSTAR) 100 UNIT/ML Solostar Pen Inject 45 Units into the skin daily.  06/19/15 05/05/17  [provider]  levocetirizine (XYZAL) 5 MG tablet Take 5 mg by mouth every evening.    [provider]  levothyroxine (SYNTHROID, LEVOTHROID) 50 MCG tablet Take 50 mcg by mouth daily before breakfast.    [provider]  liraglutide (VICTOZA) 18 MG/3ML SOPN Inject 1.8 mg into the skin at bedtime.    [provider]  lisinopril (PRINIVIL,ZESTRIL) 5 MG tablet Take 5 mg by mouth at bedtime.  03/28/15 05/05/17  [provider]  metFORMIN (GLUCOPHAGE) 500 MG tablet Take 500 mg by mouth 2 (two) times daily.  02/15/15   [provider]  Multiple Vitamin (MULTIVITAMIN  WITH MINERALS) TABS tablet Take 1 tablet by mouth daily.    [provider]  Multiple Vitamins-Minerals (PRESERVISION AREDS 2) CAPS Take 1 capsule by mouth 2 (two) times daily.    [provider]  nitroGLYCERIN (NITROSTAT) 0.4 MG SL tablet Place 1 tablet under the tongue as needed. For chest pain 05/08/15   [provider]  nystatin (MYCOSTATIN/NYSTOP) powder Apply 1 g topically daily as needed (jock itch).    [provider]  omeprazole (PRILOSEC) 40 MG capsule Take 40 mg by mouth daily.  12/18/14   [provider]  ondansetron (ZOFRAN ODT) 4 MG disintegrating tablet Take 1 tablet (4 mg total) by mouth every 8 (eight) hours as needed for nausea or vomiting. 10/17/17   Alfred Levins, [place], MD  oxyCODONE (ROXICODONE) 5 MG immediate release tablet Take 1-2 tablets (5-10 mg total) by mouth every 4 (four) hours as needed. 05/13/17   Poggi, Marshall Cork, MD  oxyCODONE-acetaminophen (PERCOCET) 5-325 MG tablet Take 1 tablet by mouth every 4 (four) hours as needed for severe pain. 10/17/17   Rudene Re, MD  rosuvastatin (CRESTOR) 20 MG tablet Take 20 mg by mouth at bedtime.  09/02/14   [provider]  sildenafil (REVATIO) 20 MG tablet Take 40-60 mg by mouth daily as needed (erectile dysfunction).    [provider]    Allergies Sulfamethoxazole; Tomato; and Fish oil  History reviewed. No pertinent family history.  Social History Social History   Tobacco Use  . Smoking status: Former Smoker    Types: Cigarettes    Last attempt to quit: 01/04/1998    Years since quitting: 19.7  . Smokeless tobacco: Never Used  Substance Use Topics  . Alcohol use: Yes    Alcohol/week: 12.0 - 14.0 standard drinks    Types: 6 - 7 Cans of beer, 6 - 7 Standard drinks or equivalent per week  . Drug use: No    Review of Systems  Constitutional: Negative for fever. Eyes: Negative for visual changes. ENT: Negative for sore throat. Neck: No neck pain    Cardiovascular: + chest pain, diaphoresis Respiratory: Negative for shortness of breath. Gastrointestinal: + epigastric abdominal pain and nausea. No vomiting or diarrhea. Genitourinary: Negative for dysuria. Musculoskeletal: Negative for back pain. Skin: Negative for rash. Neurological: Negative for headaches, weakness or numbness. Psych: No SI or HI  ____________________________________________   PHYSICAL EXAM:  VITAL SIGNS: ED Triage Vitals  Enc Vitals Group     BP 10/17/17 1920 (!) 145/68     Pulse Rate 10/17/17 1916 62     Resp 10/17/17 1916 17     Temp 10/17/17 1920 97.7 F (36.5 C)     Temp Source 10/17/17 1920 Oral     SpO2 10/17/17 1920 99 %     Weight 10/17/17 1917 245 lb (111.1 kg)     Height 10/17/17  1917 6' (1.829 m)     Head Circumference --      Peak Flow --      Pain Score 10/17/17 1916 3     Pain Loc --      Pain Edu? --      Excl. in [place]? --     Constitutional: Alert and oriented. Well appearing and in no apparent distress. HEENT:      Head: Normocephalic and atraumatic.         Eyes: Conjunctivae are normal. Sclera is non-icteric.       Mouth/Throat: Mucous membranes are moist.       Neck: Supple with no signs of meningismus. Cardiovascular: Regular rate and rhythm. No murmurs, gallops, or rubs. 2+ symmetrical distal pulses are present in all extremities. No JVD. Respiratory: Normal respiratory effort. Lungs are clear to auscultation bilaterally. No wheezes, crackles, or rhonchi.  Gastrointestinal: Soft, obese, tender to palpation over the epigastric and RUQ regions with negative Murphy sign, non distended with positive bowel sounds. No rebound or guarding. Genitourinary: No CVA tenderness. Musculoskeletal: Nontender with normal range of motion in all extremities. No edema, cyanosis, or erythema of extremities. Neurologic: Normal speech and language. Face is symmetric. Moving all extremities. No gross focal neurologic deficits are appreciated. Skin:  Skin is warm, dry and intact. No rash noted. Psychiatric: Mood and affect are normal. Speech and behavior are normal.  ____________________________________________   LABS (all labs ordered are listed, but only abnormal results are displayed)  Labs Reviewed  CBC - Abnormal; Notable for the following components:      Result Value   WBC 15.2 (*)    RBC 4.16 (*)    Hemoglobin 12.8 (*)    HCT 38.9 (*)    All other components within normal limits  COMPREHENSIVE METABOLIC PANEL - Abnormal; Notable for the following components:   Glucose, Bld 144 (*)    BUN 27 (*)    Creatinine, Ser 1.69 (*)    Calcium 8.8 (*)    AST 85 (*)    ALT 57 (*)    GFR calc non Af Amer 37 (*)    GFR calc Af Amer 43 (*)    All other components within normal limits  LIPASE, BLOOD - Abnormal; Notable for the following components:   Lipase 4,365 (*)    All other components within normal limits  URINALYSIS, COMPLETE (UACMP) WITH MICROSCOPIC - Abnormal; Notable for the following components:   Color, Urine YELLOW (*)    APPearance CLEAR (*)    Protein, ur 30 (*)    All other components within normal limits  TROPONIN I  TROPONIN I   ____________________________________________  EKG  ED ECG REPORT I, Rudene Re, the attending physician, personally viewed and interpreted this ECG.  Sinus bradycardia, rate of 59, first-degree AV block, normal QRS and QTc intervals, occasional PVCs, normal axis, anterior Q waves, no ST elevations or depressions.  Unchanged from prior. ____________________________________________  RADIOLOGY  I have personally reviewed the images performed during this visit and I agree with the Radiologist's read.   Interpretation by Radiologist:  Dg Chest 2 View  Result Date: 10/17/2017 CLINICAL DATA:  Chest pain EXAM: CHEST - 2 VIEW COMPARISON:  01/16/2017 FINDINGS: Streaky atelectasis at the left base. No focal consolidation. Normal heart size. No pneumothorax. IMPRESSION: No  active cardiopulmonary disease. Streaky atelectasis at the left base Electronically Signed   By: Donavan Foil M.D.   On: 10/17/2017 19:51   Ct Chest W  Contrast  Result Date: 10/17/2017 CLINICAL DATA:  Initial evaluation for acute chest pain and abdominal pain, shortness of breath. EXAM: CT CHEST, ABDOMEN, AND PELVIS WITH CONTRAST TECHNIQUE: Multidetector CT imaging of the chest, abdomen and pelvis was performed following the standard protocol during bolus administration of intravenous contrast. CONTRAST:  164mL ISOVUE-300 IOPAMIDOL (ISOVUE-300) INJECTION 61% COMPARISON:  Prior ultrasound from earlier the same day. FINDINGS: CT CHEST FINDINGS Cardiovascular: Ascending aorta dilated up to 4.6 cm in diameter. Intrathoracic aorta otherwise within normal limits without acute abnormality. Moderate aortic atherosclerosis. Focal irregular plaque protrudes into the aortic lumen at the undersurface of the aortic arch. Visualized great vessels within normal limits. Mild cardiomegaly. Diffuse 3 vessel coronary artery calcifications. Thinning at the left ventricular apex with associated fatty degeneration, consistent with prior subendocardial MI (series 2, image 42). No pericardial effusion. Limited evaluation of the pulmonary arterial tree unremarkable. Mediastinum/Nodes: Thyroid normal. No enlarged mediastinal lymph nodes identified. Mildly enlarged 12 mm left hilar node (series 2, image 38). 11 mm right hilar node (series 2, image 35). No axillary adenopathy. Normal esophagus. Lungs/Pleura: Tracheobronchial tree intact and patent. Lungs are well inflated bilaterally. Scattered predominantly subpleural reticular densities noted involving the bilateral lungs in a basilar predominant fashion, most like related to underlying mild fibrotic lung disease. Paraseptal emphysematous changes present at the upper lobes bilaterally. No focal infiltrates or pulmonary edema. No pleural effusion. No pneumothorax. Few subcentimeter  calcified granulomas noted. 7 mm pleural base nodule at the left lower lobe (series 4, image 100), indeterminate. No other worrisome pulmonary nodule or mass. Musculoskeletal: External soft tissues demonstrate no acute finding. No acute osseous abnormality. No worrisome lytic or blastic osseous lesions. CT ABDOMEN PELVIS FINDINGS Hepatobiliary: Liver demonstrates a normal contrast enhanced appearance. Layering hyperdensity within the gallbladder lumen most likely reflects sludge as seen on prior ultrasound. Gallbladder mildly distended with trace pericholecystic free fluid. No biliary dilatation. Pancreas: Diffuse inflammatory stranding seen about the pancreas, most notable at the pancreatic head and uncinate process, suggesting acute pancreatitis. No loculated peripancreatic collections or evidence for pancreatic necrosis. Spleen: Spleen within normal limits. Adrenals/Urinary Tract: Adrenal glands are normal. Kidneys equal in size with symmetric enhancement. No nephrolithiasis, hydronephrosis, or focal enhancing renal mass. No hydroureter. Bladder grossly unremarkable, although evaluation limited by streak artifact from right hip arthroplasty. Stomach/Bowel: Stomach moderately distended without acute abnormality. Inflammatory stranding surrounds the duodenum related to the acute inflammatory process within the adjacent pancreas. No evidence for bowel obstruction. Normal appendix. Colonic diverticulosis without evidence for acute diverticulitis. No other acute inflammatory changes seen about the bowels. Vascular/Lymphatic: Normal intravascular enhancement seen throughout the intra-abdominal aorta. Infrarenal aorta ectatic measuring up to 2.7 cm. Moderate aortic atherosclerosis. Mesenteric vessels patent proximally. No evidence for venous thrombosis related to the acute pancreatitis. No adenopathy. Reproductive: Normal prostate. Other: No free air or fluid. Musculoskeletal: Right hip arthroplasty in place. No acute  osseous abnormality. No worrisome lytic or blastic osseous lesions. IMPRESSION: CT CHEST IMPRESSION 1. No acute abnormality within the chest. 2. 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 3. Myocardial thinning with fatty degeneration at the left ventricular apex, consistent with previous myocardial infarction. 4. 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 Recommend semi-annual imaging followup by CTA or MRA and referral to cardiothoracic surgery if not already obtained. 5. **An incidental finding of potential clinical significance has been found. 7 mm left lower lobe pulmonary nodule, indeterminate. Non-contrast chest CT at 6-12  months is recommended. If the nodule is stable at time of repeat CT, then future CT at 18-24 months (from today's scan) is considered optional for low-risk patients, but is recommended for high-risk patients. This recommendation follows the consensus statement: Guidelines for Management of Incidental Pulmonary Nodules Detected on CT Images: From the Fleischner Society 2017; Radiology 2017; 284:228-243.** CT ABDOMEN AND PELVIS IMPRESSION 1. Acute inflammatory changes surrounding the pancreas, consistent with acute pancreatitis. Correlation with serum lipase recommended. No loculated peripancreatic collections or other complication identified. 2. Mildly distended gallbladder with internal sludge with trace free pericholecystic fluid. Findings felt to likely be secondary in nature due to the acute pancreatic inflammation. 3. Colonic diverticulosis without evidence for acute diverticulitis. 4. 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 Recommend followup by ultrasound in 5 years. This recommendation follows ACR consensus guidelines: White Paper of the ACR Incidental Findings Committee II on Vascular Findings. J Am Coll Radiol 2013;  10:789-794. Electronically Signed   By: Jeannine Boga M.D.   On: 10/17/2017 22:37   Ct Abdomen Pelvis W Contrast  Result Date: 10/17/2017 CLINICAL DATA:  Initial evaluation for acute chest pain and abdominal pain, shortness of breath. EXAM: CT CHEST, ABDOMEN, AND PELVIS WITH CONTRAST TECHNIQUE: Multidetector CT imaging of the chest, abdomen and pelvis was performed following the standard protocol during bolus administration of intravenous contrast. CONTRAST:  124mL ISOVUE-300 IOPAMIDOL (ISOVUE-300) INJECTION 61% COMPARISON:  Prior ultrasound from earlier the same day. FINDINGS: CT CHEST FINDINGS Cardiovascular: Ascending aorta dilated up to 4.6 cm in diameter. Intrathoracic aorta otherwise within normal limits without acute abnormality. Moderate aortic atherosclerosis. Focal irregular plaque protrudes into the aortic lumen at the undersurface of the aortic arch. Visualized great vessels within normal limits. Mild cardiomegaly. Diffuse 3 vessel coronary artery calcifications. Thinning at the left ventricular apex with associated fatty degeneration, consistent with prior subendocardial MI (series 2, image 42). No pericardial effusion. Limited evaluation of the pulmonary arterial tree unremarkable. Mediastinum/Nodes: Thyroid normal. No enlarged mediastinal lymph nodes identified. Mildly enlarged 12 mm left hilar node (series 2, image 38). 11 mm right hilar node (series 2, image 35). No axillary adenopathy. Normal esophagus. Lungs/Pleura: Tracheobronchial tree intact and patent. Lungs are well inflated bilaterally. Scattered predominantly subpleural reticular densities noted involving the bilateral lungs in a basilar predominant fashion, most like related to underlying mild fibrotic lung disease. Paraseptal emphysematous changes present at the upper lobes bilaterally. No focal infiltrates or pulmonary edema. No pleural effusion. No pneumothorax. Few subcentimeter calcified granulomas noted. 7 mm pleural base  nodule at the left lower lobe (series 4, image 100), indeterminate. No other worrisome pulmonary nodule or mass. Musculoskeletal: External soft tissues demonstrate no acute finding. No acute osseous abnormality. No worrisome lytic or blastic osseous lesions. CT ABDOMEN PELVIS FINDINGS Hepatobiliary: Liver demonstrates a normal contrast enhanced appearance. Layering hyperdensity within the gallbladder lumen most likely reflects sludge as seen on prior ultrasound. Gallbladder mildly distended with trace pericholecystic free fluid. No biliary dilatation. Pancreas: Diffuse inflammatory stranding seen about the pancreas, most notable at the pancreatic head and uncinate process, suggesting acute pancreatitis. No loculated peripancreatic collections or evidence for pancreatic necrosis. Spleen: Spleen within normal limits. Adrenals/Urinary Tract: Adrenal glands are normal. Kidneys equal in size with symmetric enhancement. No nephrolithiasis, hydronephrosis, or focal enhancing renal mass. No hydroureter. Bladder grossly unremarkable, although evaluation limited by streak artifact from right hip arthroplasty. Stomach/Bowel: Stomach moderately distended without acute abnormality. Inflammatory stranding surrounds the duodenum related to  the acute inflammatory process within the adjacent pancreas. No evidence for bowel obstruction. Normal appendix. Colonic diverticulosis without evidence for acute diverticulitis. No other acute inflammatory changes seen about the bowels. Vascular/Lymphatic: Normal intravascular enhancement seen throughout the intra-abdominal aorta. Infrarenal aorta ectatic measuring up to 2.7 cm. Moderate aortic atherosclerosis. Mesenteric vessels patent proximally. No evidence for venous thrombosis related to the acute pancreatitis. No adenopathy. Reproductive: Normal prostate. Other: No free air or fluid. Musculoskeletal: Right hip arthroplasty in place. No acute osseous abnormality. No worrisome lytic or  blastic osseous lesions. IMPRESSION: CT CHEST IMPRESSION 1. No acute abnormality within the chest. 2. 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 3. Myocardial thinning with fatty degeneration at the left ventricular apex, consistent with previous myocardial infarction. 4. 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 Recommend semi-annual imaging followup by CTA or MRA and referral to cardiothoracic surgery if not already obtained. 5. **An incidental finding of potential clinical significance has been found. 7 mm left lower lobe pulmonary nodule, indeterminate. Non-contrast chest CT at 6-12 months is recommended. If the nodule is stable at time of repeat CT, then future CT at 18-24 months (from today's scan) is considered optional for low-risk patients, but is recommended for high-risk patients. This recommendation follows the consensus statement: Guidelines for Management of Incidental Pulmonary Nodules Detected on CT Images: From the Fleischner Society 2017; Radiology 2017; 284:228-243.** CT ABDOMEN AND PELVIS IMPRESSION 1. Acute inflammatory changes surrounding the pancreas, consistent with acute pancreatitis. Correlation with serum lipase recommended. No loculated peripancreatic collections or other complication identified. 2. Mildly distended gallbladder with internal sludge with trace free pericholecystic fluid. Findings felt to likely be secondary in nature due to the acute pancreatic inflammation. 3. Colonic diverticulosis without evidence for acute diverticulitis. 4. 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 Recommend followup by ultrasound in 5 years. This recommendation follows ACR consensus guidelines: White Paper of the ACR Incidental Findings Committee II on Vascular Findings. J Am Coll Radiol 2013; 10:789-794. Electronically Signed   By: Jeannine Boga M.D.   On: 10/17/2017 22:37   US Abdomen Limited Ruq  Result Date: 10/17/2017 CLINICAL DATA:  77 y/o M; abdominal pain with nausea and vomiting after eating seafood at approximately 1800 hours today. EXAM: ULTRASOUND ABDOMEN LIMITED RIGHT UPPER QUADRANT COMPARISON:  None. FINDINGS: Gallbladder: Small amount of gallbladder sludge. No gallbladder wall thickening or pericholecystic fluid. Negative sonographic Murphy sign. Common bile duct: Diameter: 4.7 mm Liver: No focal lesion identified. Within normal limits in parenchymal echogenicity. Portal vein is patent on color Doppler imaging with normal direction of blood flow towards the liver. IMPRESSION: Small amount of gallbladder sludge. No secondary signs of acute cholecystitis. Electronically Signed   By: Kristine Garbe M.D.   On: 10/17/2017 21:19      ____________________________________________   PROCEDURES  Procedure(s) performed: None Procedures Critical Care performed:  None ____________________________________________   INITIAL IMPRESSION / ASSESSMENT AND PLAN / ED COURSE  77 y.o. male with history of CAD status post 2 stents, CKD, diabetes, GERD, hypertension, hyperlipidemia who presents for evaluation of lower chest pain/ epigastric pain, nausea, and diaphoresis.  Patient is well-appearing, no distress, normal vital signs, abdomen is obese with tenderness to palpation on the epigastric and right upper quadrant regions, no rebound or guarding, lungs are clear to auscultation, heart regular rate and rhythm.  EKG showing no acute ischemic changes.  Differential  diagnosis includes ACS versus gallbladder disease versus GERD versus peptic ulcer disease versus pancreatitis.  Will check CBC, CMP, lipase, troponin.  Will get a chest x-ray.  Will give IV pepcid, morphine, and Zofran for symptom relief.  Clinical Course as of Oct 18 2327  Fri Oct 17, 2017  2031 First troponin is negative.  Labs showing slightly elevated LFTs  with normal T bili, leukocytosis with white count of 15.2.  Patient remains tender to palpation on the epigastric/right upper quadrant region.  Will send for right upper quadrant ultrasound.  First troponin is negative   [CV]    Clinical Course User Index [CV] Alfred Levins, [place], MD   _________________________ 11:13 PM on 10/17/2017 -----------------------------------------  Labs with elevated lipase and a CT showing acute pancreatitis.  No evidence of gallstone pancreatitis.  Patient remains extremely well-appearing with minimal pain, normal vital signs.  Based on room some criteria patient is low risk for severe pancreatitis.  He will be discharged home with Zofran and Percocet.  Discussed strict return precautions in case pain is getting worse, fever, vomiting, dizziness. Otherwise he will follow-up with his primary care doctor on Monday.  CT also showed several incidental findings including an ascending thoracic aneurysm measuring 4.6 cm, a 7 mm left lung nodule, and ectatic abdominal aorta.  I discussed these findings with patient and his family.  He is being referred back to his primary care doctor for monitoring of the lung nodules and vascular surgery for monitoring of vascular findings.  As part of my medical decision making, I reviewed the following data within the [place] notes reviewed and incorporated, Labs reviewed , EKG interpreted , Old EKG reviewed, Old chart reviewed, Radiograph reviewed  Notes from prior ED visits and [place] Controlled Substance Database    Pertinent labs & imaging results that were available during my care of the patient were reviewed by me and considered in my medical decision making (see chart for details).    ____________________________________________   FINAL CLINICAL IMPRESSION(S) / ED DIAGNOSES  Final diagnoses:  Epigastric abdominal pain  Acute pancreatitis, unspecified complication status, unspecified pancreatitis type    Thoracic aortic aneurysm without rupture (HCC)  Ectatic abdominal aorta (HCC)  Lung nodule      NEW MEDICATIONS STARTED DURING THIS VISIT:  ED Discharge Orders         Ordered    ondansetron (ZOFRAN ODT) 4 MG disintegrating tablet  Every 8 hours PRN     10/17/17 2310    oxyCODONE-acetaminophen (PERCOCET) 5-325 MG tablet  Every 4 hours PRN     10/17/17 2310           Note:  This document was prepared using Dragon voice recognition software and may include unintentional dictation errors.    Rudene Re, MD 10/17/17 2329

## 2017-10-17 NOTE — ED Notes (Signed)
Patient transported to Ultrasound 

## 2017-10-17 NOTE — ED Notes (Signed)
Patient transported to CT 

## 2017-10-17 NOTE — ED Notes (Signed)
Patient transported to X-ray 

## 2017-10-20 ENCOUNTER — Encounter: Payer: Self-pay | Admitting: Emergency Medicine

## 2017-10-20 ENCOUNTER — Inpatient Hospital Stay
Admission: EM | Admit: 2017-10-20 | Discharge: 2017-10-24 | DRG: 418 | Disposition: A | Discharge status: Home / Self Care | Payer: Medicare Other | Attending: Internal Medicine | Admitting: Internal Medicine

## 2017-10-20 ENCOUNTER — Emergency Department: Payer: Medicare Other

## 2017-10-20 ENCOUNTER — Other Ambulatory Visit: Payer: Self-pay

## 2017-10-20 DIAGNOSIS — E785 Hyperlipidemia, unspecified: Secondary | ICD-10-CM | POA: Diagnosis present

## 2017-10-20 DIAGNOSIS — I2510 Atherosclerotic heart disease of native coronary artery without angina pectoris: Secondary | ICD-10-CM | POA: Diagnosis present

## 2017-10-20 DIAGNOSIS — K8020 Calculus of gallbladder without cholecystitis without obstruction: Secondary | ICD-10-CM

## 2017-10-20 DIAGNOSIS — Z8614 Personal history of Methicillin resistant Staphylococcus aureus infection: Secondary | ICD-10-CM

## 2017-10-20 DIAGNOSIS — Z7982 Long term (current) use of aspirin: Secondary | ICD-10-CM

## 2017-10-20 DIAGNOSIS — I252 Old myocardial infarction: Secondary | ICD-10-CM

## 2017-10-20 DIAGNOSIS — E039 Hypothyroidism, unspecified: Secondary | ICD-10-CM | POA: Diagnosis present

## 2017-10-20 DIAGNOSIS — R911 Solitary pulmonary nodule: Secondary | ICD-10-CM | POA: Diagnosis present

## 2017-10-20 DIAGNOSIS — Z96653 Presence of artificial knee joint, bilateral: Secondary | ICD-10-CM | POA: Diagnosis present

## 2017-10-20 DIAGNOSIS — Z96641 Presence of right artificial hip joint: Secondary | ICD-10-CM | POA: Diagnosis present

## 2017-10-20 DIAGNOSIS — K851 Biliary acute pancreatitis without necrosis or infection: Secondary | ICD-10-CM | POA: Diagnosis present

## 2017-10-20 DIAGNOSIS — Z87891 Personal history of nicotine dependence: Secondary | ICD-10-CM

## 2017-10-20 DIAGNOSIS — M5134 Other intervertebral disc degeneration, thoracic region: Secondary | ICD-10-CM | POA: Diagnosis present

## 2017-10-20 DIAGNOSIS — Z955 Presence of coronary angioplasty implant and graft: Secondary | ICD-10-CM

## 2017-10-20 DIAGNOSIS — Z794 Long term (current) use of insulin: Secondary | ICD-10-CM

## 2017-10-20 DIAGNOSIS — K429 Umbilical hernia without obstruction or gangrene: Secondary | ICD-10-CM

## 2017-10-20 DIAGNOSIS — K219 Gastro-esophageal reflux disease without esophagitis: Secondary | ICD-10-CM | POA: Diagnosis present

## 2017-10-20 DIAGNOSIS — I129 Hypertensive chronic kidney disease with stage 1 through stage 4 chronic kidney disease, or unspecified chronic kidney disease: Secondary | ICD-10-CM | POA: Diagnosis present

## 2017-10-20 DIAGNOSIS — Z79890 Hormone replacement therapy (postmenopausal): Secondary | ICD-10-CM

## 2017-10-20 DIAGNOSIS — N183 Chronic kidney disease, stage 3 (moderate): Secondary | ICD-10-CM | POA: Diagnosis present

## 2017-10-20 DIAGNOSIS — E1122 Type 2 diabetes mellitus with diabetic chronic kidney disease: Secondary | ICD-10-CM | POA: Diagnosis present

## 2017-10-20 DIAGNOSIS — Z91018 Allergy to other foods: Secondary | ICD-10-CM | POA: Diagnosis not present

## 2017-10-20 DIAGNOSIS — E1151 Type 2 diabetes mellitus with diabetic peripheral angiopathy without gangrene: Secondary | ICD-10-CM | POA: Diagnosis present

## 2017-10-20 DIAGNOSIS — Z882 Allergy status to sulfonamides status: Secondary | ICD-10-CM

## 2017-10-20 DIAGNOSIS — D649 Anemia, unspecified: Secondary | ICD-10-CM | POA: Diagnosis present

## 2017-10-20 DIAGNOSIS — H3530 Unspecified macular degeneration: Secondary | ICD-10-CM | POA: Diagnosis present

## 2017-10-20 DIAGNOSIS — N179 Acute kidney failure, unspecified: Secondary | ICD-10-CM | POA: Diagnosis present

## 2017-10-20 DIAGNOSIS — Z9852 Vasectomy status: Secondary | ICD-10-CM

## 2017-10-20 DIAGNOSIS — R509 Fever, unspecified: Secondary | ICD-10-CM | POA: Diagnosis present

## 2017-10-20 DIAGNOSIS — D72829 Elevated white blood cell count, unspecified: Secondary | ICD-10-CM

## 2017-10-20 DIAGNOSIS — Z79899 Other long term (current) drug therapy: Secondary | ICD-10-CM

## 2017-10-20 DIAGNOSIS — Z79891 Long term (current) use of opiate analgesic: Secondary | ICD-10-CM

## 2017-10-20 DIAGNOSIS — K5900 Constipation, unspecified: Secondary | ICD-10-CM | POA: Diagnosis present

## 2017-10-20 DIAGNOSIS — K859 Acute pancreatitis without necrosis or infection, unspecified: Secondary | ICD-10-CM | POA: Diagnosis present

## 2017-10-20 DIAGNOSIS — R011 Cardiac murmur, unspecified: Secondary | ICD-10-CM | POA: Diagnosis present

## 2017-10-20 DIAGNOSIS — R627 Adult failure to thrive: Secondary | ICD-10-CM | POA: Diagnosis present

## 2017-10-20 DIAGNOSIS — M1990 Unspecified osteoarthritis, unspecified site: Secondary | ICD-10-CM | POA: Diagnosis present

## 2017-10-20 LAB — CBC
HCT: 34 % — ABNORMAL LOW (ref 40.0–52.0)
HEMOGLOBIN: 11.5 g/dL — AB (ref 13.0–18.0)
MCH: 31.3 pg (ref 26.0–34.0)
MCHC: 33.7 g/dL (ref 32.0–36.0)
MCV: 92.8 fL (ref 80.0–100.0)
Platelets: 210 10*3/uL (ref 150–440)
RBC: 3.67 MIL/uL — AB (ref 4.40–5.90)
RDW: 14.3 % (ref 11.5–14.5)
WBC: 17.8 10*3/uL — ABNORMAL HIGH (ref 3.8–10.6)

## 2017-10-20 LAB — GLUCOSE, CAPILLARY: Glucose-Capillary: 138 mg/dL — ABNORMAL HIGH (ref 70–99)

## 2017-10-20 LAB — COMPREHENSIVE METABOLIC PANEL
ALK PHOS: 75 U/L (ref 38–126)
ALT: 50 U/L — ABNORMAL HIGH (ref 0–44)
AST: 89 U/L — ABNORMAL HIGH (ref 15–41)
Albumin: 3.5 g/dL (ref 3.5–5.0)
Anion gap: 10 (ref 5–15)
BUN: 37 mg/dL — ABNORMAL HIGH (ref 8–23)
CALCIUM: 8.5 mg/dL — AB (ref 8.9–10.3)
CO2: 21 mmol/L — AB (ref 22–32)
Chloride: 105 mmol/L (ref 98–111)
Creatinine, Ser: 2.89 mg/dL — ABNORMAL HIGH (ref 0.61–1.24)
GFR calc non Af Amer: 19 mL/min — ABNORMAL LOW (ref >60)
GFR, EST AFRICAN AMERICAN: 22 mL/min — AB (ref >60)
Glucose, Bld: 158 mg/dL — ABNORMAL HIGH (ref 70–99)
Potassium: 4.1 mmol/L (ref 3.5–5.1)
SODIUM: 136 mmol/L (ref 135–145)
Total Bilirubin: 1.1 mg/dL (ref 0.3–1.2)
Total Protein: 7 g/dL (ref 6.5–8.1)

## 2017-10-20 LAB — LIPASE, BLOOD: LIPASE: 35 U/L (ref 11–51)

## 2017-10-20 MED ORDER — SODIUM CHLORIDE 0.9 % IV BOLUS
1000.0000 mL | Freq: Once | INTRAVENOUS | Status: AC
  Administered 2017-10-20: 1000 mL via INTRAVENOUS

## 2017-10-20 MED ORDER — GABAPENTIN 300 MG PO CAPS
300.0000 mg | ORAL_CAPSULE | Freq: Every day | ORAL | Status: DC
  Administered 2017-10-21 – 2017-10-23 (×3): 300 mg via ORAL
  Filled 2017-10-20 (×3): qty 1

## 2017-10-20 MED ORDER — SENNOSIDES-DOCUSATE SODIUM 8.6-50 MG PO TABS: 1.0000 | ORAL_TABLET | Freq: Every evening | ORAL | Status: DC | PRN

## 2017-10-20 MED ORDER — ALBUTEROL SULFATE (2.5 MG/3ML) 0.083% IN NEBU: 2.5000 mg | INHALATION_SOLUTION | RESPIRATORY_TRACT | Status: DC | PRN

## 2017-10-20 MED ORDER — ROSUVASTATIN CALCIUM 20 MG PO TABS
20.0000 mg | ORAL_TABLET | Freq: Every day | ORAL | Status: DC
  Administered 2017-10-21 – 2017-10-23 (×3): 20 mg via ORAL
  Filled 2017-10-20 (×3): qty 1

## 2017-10-20 MED ORDER — LEVOTHYROXINE SODIUM 50 MCG PO TABS
50.0000 ug | ORAL_TABLET | Freq: Every day | ORAL | Status: DC
  Administered 2017-10-21 – 2017-10-24 (×4): 50 ug via ORAL
  Filled 2017-10-20 (×4): qty 1

## 2017-10-20 MED ORDER — HYDROCODONE-ACETAMINOPHEN 5-325 MG PO TABS
1.0000 | ORAL_TABLET | ORAL | Status: DC | PRN
  Administered 2017-10-21 – 2017-10-22 (×4): 2 via ORAL
  Administered 2017-10-23: 1 via ORAL
  Filled 2017-10-20 (×3): qty 2
  Filled 2017-10-20: qty 1
  Filled 2017-10-20: qty 2

## 2017-10-20 MED ORDER — HEPARIN SODIUM (PORCINE) 5000 UNIT/ML IJ SOLN
5000.0000 [IU] | Freq: Three times a day (TID) | INTRAMUSCULAR | Status: DC
  Administered 2017-10-20 – 2017-10-24 (×8): 5000 [IU] via SUBCUTANEOUS
  Filled 2017-10-20 (×8): qty 1

## 2017-10-20 MED ORDER — LEVOCETIRIZINE DIHYDROCHLORIDE 5 MG PO TABS: 5.0000 mg | ORAL_TABLET | Freq: Every evening | ORAL | Status: DC

## 2017-10-20 MED ORDER — KETOROLAC TROMETHAMINE 15 MG/ML IJ SOLN
15.0000 mg | Freq: Four times a day (QID) | INTRAMUSCULAR | Status: DC | PRN
  Filled 2017-10-20: qty 1

## 2017-10-20 MED ORDER — CYCLOBENZAPRINE HCL 10 MG PO TABS: 10.0000 mg | ORAL_TABLET | Freq: Three times a day (TID) | ORAL | Status: DC | PRN

## 2017-10-20 MED ORDER — ONDANSETRON HCL 4 MG/2ML IJ SOLN
4.0000 mg | Freq: Four times a day (QID) | INTRAMUSCULAR | Status: DC | PRN
  Administered 2017-10-21: 4 mg via INTRAVENOUS
  Filled 2017-10-20: qty 2

## 2017-10-20 MED ORDER — IOPAMIDOL (ISOVUE-300) INJECTION 61%
15.0000 mL | INTRAVENOUS | Status: AC
  Administered 2017-10-20: 15 mL via ORAL

## 2017-10-20 MED ORDER — NITROGLYCERIN 0.4 MG SL SUBL: 0.4000 mg | SUBLINGUAL_TABLET | SUBLINGUAL | Status: DC | PRN

## 2017-10-20 MED ORDER — INSULIN GLARGINE 100 UNIT/ML ~~LOC~~ SOLN
45.0000 [IU] | Freq: Every day | SUBCUTANEOUS | Status: DC
  Filled 2017-10-20 (×2): qty 0.45

## 2017-10-20 MED ORDER — ASPIRIN EC 81 MG PO TBEC
81.0000 mg | DELAYED_RELEASE_TABLET | Freq: Every day | ORAL | Status: DC
  Administered 2017-10-21 – 2017-10-24 (×3): 81 mg via ORAL
  Filled 2017-10-20 (×3): qty 1

## 2017-10-20 MED ORDER — LORATADINE 10 MG PO TABS
10.0000 mg | ORAL_TABLET | Freq: Every day | ORAL | Status: DC
  Administered 2017-10-21 – 2017-10-23 (×3): 10 mg via ORAL
  Filled 2017-10-20 (×3): qty 1

## 2017-10-20 MED ORDER — BISACODYL 5 MG PO TBEC: 5.0000 mg | DELAYED_RELEASE_TABLET | Freq: Every day | ORAL | Status: DC | PRN

## 2017-10-20 MED ORDER — VITAMIN B-12 1000 MCG PO TABS
2500.0000 ug | ORAL_TABLET | Freq: Every day | ORAL | Status: DC
  Administered 2017-10-21 – 2017-10-24 (×3): 2500 ug via ORAL
  Filled 2017-10-20 (×3): qty 3

## 2017-10-20 MED ORDER — ACETAMINOPHEN 325 MG PO TABS
650.0000 mg | ORAL_TABLET | Freq: Four times a day (QID) | ORAL | Status: DC | PRN
  Administered 2017-10-21: 05:00:00 650 mg via ORAL
  Filled 2017-10-20 (×2): qty 2

## 2017-10-20 MED ORDER — PANTOPRAZOLE SODIUM 40 MG PO TBEC
40.0000 mg | DELAYED_RELEASE_TABLET | Freq: Every day | ORAL | Status: DC
  Administered 2017-10-21 – 2017-10-24 (×3): 40 mg via ORAL
  Filled 2017-10-20 (×3): qty 1

## 2017-10-20 MED ORDER — INSULIN ASPART 100 UNIT/ML ~~LOC~~ SOLN: 0.0000 [IU] | Freq: Three times a day (TID) | SUBCUTANEOUS | Status: DC

## 2017-10-20 MED ORDER — SODIUM CHLORIDE 0.9 % IV SOLN
INTRAVENOUS | Status: DC
  Administered 2017-10-20 – 2017-10-22 (×2): via INTRAVENOUS
  Administered 2017-10-23: 75 mL/h via INTRAVENOUS
  Administered 2017-10-23 (×2): via INTRAVENOUS

## 2017-10-20 MED ORDER — ONDANSETRON HCL 4 MG PO TABS
4.0000 mg | ORAL_TABLET | Freq: Four times a day (QID) | ORAL | Status: DC | PRN
  Administered 2017-10-22 (×2): 4 mg via ORAL
  Filled 2017-10-20 (×2): qty 1

## 2017-10-20 MED ORDER — INSULIN ASPART 100 UNIT/ML ~~LOC~~ SOLN: 0.0000 [IU] | Freq: Every day | SUBCUTANEOUS | Status: DC

## 2017-10-20 MED ORDER — ACETAMINOPHEN 650 MG RE SUPP: 650.0000 mg | Freq: Four times a day (QID) | RECTAL | Status: DC | PRN

## 2017-10-20 NOTE — ED Notes (Signed)
Patient transported to CT 

## 2017-10-20 NOTE — Progress Notes (Signed)
Advanced Care Plan.  Purpose of Encounter: CODE STATUS. Parties in Attendance: The patient, his close friend and the son, me. Patient's Decisional Capacity: Yes. Medical Story: [name] [name]  is a 77 y.o. male with a known history of hypertension, diabetes, CKD stage III, CAD, DDD, GERD, hyperlipidemia, hypothyroidism, PVD and anemia.  The patient is being admitted for worsening abdominal pain due to acute pancreatitis.  I discussed the patient current condition, prognosis and CODE STATUS.  The patient stated that he wants to be resuscitated and intubated to save his life. Plan:  Code Status: Full code. Time spent discussing advance care planning: 16 to 17 minutes.

## 2017-10-20 NOTE — ED Notes (Signed)
Report given to [facility] RN

## 2017-10-20 NOTE — H&P (Signed)
[place] [facility] [place] NAME: [name] [name]    MR#:  [id number]  DATE OF BIRTH:  [birth year]  DATE OF ADMISSION:  10/20/2017  PRIMARY CARE PHYSICIAN: Idelle Crouch, MD   REQUESTING/REFERRING PHYSICIAN: Dr. Rip Harbour  CHIEF COMPLAINT:   Chief Complaint  Patient presents with  . Abnormal Lab   Worsening abdominal pain for 3 days. HISTORY OF PRESENT ILLNESS:  [name] [name]  is a 77 y.o. male with a known history of hypertension, diabetes, CKD stage III, CAD, DDD, GERD, hyperlipidemia, hypothyroidism, PVD and anemia.  The patient was diagnosed with acute pancreatitis 3 days ago.  His abdominal pain has been worsening for the past 3 days.  His lipase decreased from 4000 to 35.  But the CAT scan of the abdomen show increased peripancreatic edema.  Dr. Rip Harbour discussed with GI physician and request admission.  PAST MEDICAL HISTORY:   Past Medical History:  Diagnosis Date  . Anemia   . Arthritis   . CAD (coronary artery disease)   . Chronic kidney disease    CKD stage 3  . DDD (degenerative disc disease), thoracic   . Diabetes mellitus without complication ([place])   . GERD (gastroesophageal reflux disease)   . Heart murmur   . History of heart artery stent 2014   Per patient, [name] put in 2 stents in 2014.  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 Hyperlipidemia   . Hypertension   . Hypothyroidism   . Macular degeneration   . Myocardial infarction [facility]) 1999   Per Pt, MI in 1999.  [name] PVD (peripheral vascular disease) ([place])     PAST SURGICAL HISTORY:   Past Surgical History:  Procedure Laterality Date  . BACK SURGERY    . BICEPT TENODESIS Left 05/13/2017   Procedure: BICEPS TENODESIS;  Surgeon: [name], MD;  Location: [facility] ORS;  Service: Orthopedics;  Laterality: Left;  . CORONARY ANGIOPLASTY    . JOINT REPLACEMENT Bilateral    knee  . JOINT REPLACEMENT Right    hip  . SHOULDER ARTHROSCOPY WITH OPEN  ROTATOR CUFF REPAIR Left 05/13/2017   Procedure: SHOULDER ARTHROSCOPY WITH OPEN ROTATOR CUFF REPAIR;  Surgeon: [name], MD;  Location: [facility] ORS;  Service: Orthopedics;  Laterality: Left;  subacromial decompression, debridement  . TONSILLECTOMY    . VASECTOMY      SOCIAL HISTORY:   Social History   Tobacco Use  . Smoking status: Former Smoker    Types: Cigarettes    Last attempt to quit: 01/04/1998    Years since quitting: 19.8  . Smokeless tobacco: Never Used  Substance Use Topics  . Alcohol use: Yes    Alcohol/week: 12.0 - 14.0 standard drinks    Types: 6 - 7 Cans of beer, 6 - 7 Standard drinks or equivalent per week    FAMILY HISTORY:  No family history on file.  DRUG ALLERGIES:   Allergies  Allergen Reactions  . Sulfamethoxazole Hives  . Tomato     Breaks out  . Fish Oil Rash    REVIEW OF SYSTEMS:   Review of Systems  Constitutional: Negative for chills, fever and malaise/fatigue.  HENT: Negative for sore throat.   Eyes: Negative for blurred vision and double vision.  Respiratory: Negative for cough, hemoptysis, shortness of breath, wheezing and stridor.   Cardiovascular: Negative for chest pain, palpitations, orthopnea and leg swelling.  Gastrointestinal: Positive for abdominal pain and constipation.  Negative for blood in stool, diarrhea, melena, nausea and vomiting.  Genitourinary: Negative for dysuria, flank pain and hematuria.  Musculoskeletal: Positive for back pain. Negative for joint pain.  Skin: Negative for rash.  Neurological: Negative for dizziness, sensory change, focal weakness, seizures, loss of consciousness, weakness and headaches.  Endo/Heme/Allergies: Negative for polydipsia.  Psychiatric/Behavioral: Negative for depression. The patient is not nervous/anxious.     MEDICATIONS AT HOME:   Prior to Admission medications   Medication Sig Start Date End Date Taking? Authorizing Provider  aspirin EC 81 MG tablet Take 81 mg by mouth daily.      [provider]  Cyanocobalamin (B-12) 2500 MCG TABS Take 2,500 mcg by mouth daily.    [provider]  cyclobenzaprine (FLEXERIL) 10 MG tablet Take 10 mg by mouth 3 (three) times daily as needed for muscle spasms.  08/17/14   [provider]  furosemide (LASIX) 20 MG tablet Take 20 mg by mouth daily as needed for edema.    [provider]  gabapentin (NEURONTIN) 300 MG capsule Take 300 mg by mouth at bedtime.  05/22/15   [provider]  Insulin Glargine (LANTUS SOLOSTAR) 100 UNIT/ML Solostar Pen Inject 45 Units into the skin daily.  06/19/15 05/05/17  [provider]  levocetirizine (XYZAL) 5 MG tablet Take 5 mg by mouth every evening.    [provider]  levothyroxine (SYNTHROID, LEVOTHROID) 50 MCG tablet Take 50 mcg by mouth daily before breakfast.    [provider]  liraglutide (VICTOZA) 18 MG/3ML SOPN Inject 1.8 mg into the skin at bedtime.    [provider]  lisinopril (PRINIVIL,ZESTRIL) 5 MG tablet Take 5 mg by mouth at bedtime.  03/28/15 05/05/17  [provider]  metFORMIN (GLUCOPHAGE) 500 MG tablet Take 500 mg by mouth 2 (two) times daily.  02/15/15   [provider]  Multiple Vitamin (MULTIVITAMIN WITH MINERALS) TABS tablet Take 1 tablet by mouth daily.    [provider]  Multiple Vitamins-Minerals (PRESERVISION AREDS 2) CAPS Take 1 capsule by mouth 2 (two) times daily.    [provider]  nitroGLYCERIN (NITROSTAT) 0.4 MG SL tablet Place 1 tablet under the tongue as needed. For chest pain 05/08/15   [provider]  nystatin (MYCOSTATIN/NYSTOP) powder Apply 1 g topically daily as needed (jock itch).    [provider]  omeprazole (PRILOSEC) 40 MG capsule Take 40 mg by mouth daily.  12/18/14   [provider]  ondansetron (ZOFRAN ODT) 4 MG disintegrating tablet Take 1 tablet (4 mg total) by mouth every 8 (eight) hours as needed for nausea or vomiting.  10/17/17   Alfred Levins, [place], MD  oxyCODONE (ROXICODONE) 5 MG immediate release tablet Take 1-2 tablets (5-10 mg total) by mouth every 4 (four) hours as needed. 05/13/17   Poggi, Marshall Cork, MD  oxyCODONE-acetaminophen (PERCOCET) 5-325 MG tablet Take 1 tablet by mouth every 4 (four) hours as needed for severe pain. 10/17/17   Rudene Re, MD  rosuvastatin (CRESTOR) 20 MG tablet Take 20 mg by mouth at bedtime.  09/02/14   [provider]  sildenafil (REVATIO) 20 MG tablet Take 40-60 mg by mouth daily as needed (erectile dysfunction).    [provider]      VITAL SIGNS:  Blood pressure (!) 148/77, pulse 92, temperature 99.4 F (37.4 C), temperature source Oral, resp. rate 16, height 6' (1.829 m), weight 111.1 kg, SpO2 95 %.  PHYSICAL EXAMINATION:  Physical Exam  GENERAL:  77  y.o.-year-old patient lying in the bed with no acute distress.  EYES: Pupils equal, round, reactive to light and accommodation. No scleral icterus. Extraocular muscles intact.  HEENT: Head atraumatic, normocephalic. Oropharynx and nasopharynx clear.  NECK:  Supple, no jugular venous distention. No thyroid enlargement, no tenderness.  LUNGS: Normal breath sounds bilaterally, no wheezing, rales,rhonchi or crepitation. No use of accessory muscles of respiration.  CARDIOVASCULAR: S1, S2 normal. No murmurs, rubs, or gallops.  ABDOMEN: Soft, abdominal tenderness and distention. Bowel sounds present. No organomegaly or mass.  EXTREMITIES: No pedal edema, cyanosis, or clubbing.  NEUROLOGIC: Cranial nerves II through XII are intact. Muscle strength 5/5 in all extremities. Sensation intact. Gait not checked.  PSYCHIATRIC: The patient is alert and oriented x 3.  SKIN: No obvious rash, lesion, or ulcer.   LABORATORY PANEL:   CBC Recent Labs  Lab 10/20/17 1522  WBC 17.8*  HGB 11.5*  HCT 34.0*  PLT 210    ------------------------------------------------------------------------------------------------------------------  Chemistries  Recent Labs  Lab 10/20/17 1522  NA 136  K 4.1  CL 105  CO2 21*  GLUCOSE 158*  BUN 37*  CREATININE 2.89*  CALCIUM 8.5*  AST 89*  ALT 50*  ALKPHOS 75  BILITOT 1.1   ------------------------------------------------------------------------------------------------------------------  Cardiac Enzymes Recent Labs  Lab 10/17/17 2211  TROPONINI <0.03   ------------------------------------------------------------------------------------------------------------------  RADIOLOGY:  Ct Abdomen Pelvis Wo Contrast  Result Date: 10/20/2017 CLINICAL DATA:  Upper abdominal and lower chest pain for 3 days with diaphoresis, fatigue, and low-grade fever, recently seen for pancreatitis, history of coronary artery disease post MI and coronary PTCA, stage III chronic kidney disease, hypertension, diabetes mellitus, former smoker EXAM: CT ABDOMEN AND PELVIS WITHOUT CONTRAST TECHNIQUE: Multidetector CT imaging of the abdomen and pelvis was performed following the standard protocol without IV contrast. Sagittal and coronal MPR images reconstructed from axial data set. COMPARISON:  10/17/2017 FINDINGS: Lower chest: Chronic interstitial changes at lung bases greater on RIGHT. Hepatobiliary: Dependent high attenuation gallbladder likely layering tiny gallstones. No gross gallbladder wall thickening by CT. Liver unremarkable. Pancreas: Peripancreatic edema again identified pancreatic head and proximal body consistent with acute pancreatitis. Distal pancreatic body and pancreatic tail normal appearance. Peripancreatic infiltrative changes appear increased since previous exam and now extend into the small bowel mesentery. No discrete fluid collection/abscess. Unable to assess for presence of pancreatic necrosis or patency of portal/superior mesenteric/splenic veins due the lack of  contrast. Spleen: Normal appearance Adrenals/Urinary Tract: Adrenal glands normal appearance. Persistent perinephric stranding, unchanged. No gross evidence of renal mass or hydronephrosis. Bladder and ureters unremarkable. Stomach/Bowel: Normal appendix. Sigmoid diverticulosis without evidence of diverticulitis. Wall thickening of the third portion of the duodenum secondary to adjacent pancreatitis. Stomach and remaining bowel loops normal appearance. Vascular/Lymphatic: Atherosclerotic calcifications aorta and iliac arteries. Aorta normal caliber. No adenopathy. Reproductive: Mild prostatic enlargement. Other: No free air or free fluid.  No hernia. Musculoskeletal: No acute osseous findings. IMPRESSION: Again identified changes of acute pancreatitis involving the head and body of the pancreas, with increased peripancreatic edema since the previous exam now extending into the small bowel mesentery. Associated wall thickening of the third portion of the duodenum without obstruction. Dependent cholelithiasis within gallbladder. Sigmoid diverticulosis without evidence of diverticulitis. Aortic Atherosclerosis (ICD10-I70.0). Electronically Signed   By: Lavonia Dana M.D.   On: 10/20/2017 19:19      IMPRESSION AND PLAN:   Acute pancreatitis, worsening abdominal pain The patient will be admitted to medical floor. N.p.o. except medication, IV fluid support, pain control and GI consult.  Abnormal liver function test due to above.  Follow-up LFT.  Acute renal failure on CKD stage III.  Hold lisinopril and Lasix, IV fluid support and follow-up BMP.  Leukocytosis.  Possible due to reaction.  Follow-up CBC.  Diabetes.  Start sliding scale and continue home Lantus but decrease dose to 30 unit due to n.p.o. Status.  Hypertension.  Hold lisinopril and Lasix.  Start Norvasc.  Constipation.  Colace twice daily and Dulcolax as needed.  All the records are reviewed and case discussed with ED provider. Management  plans discussed with the patient, his son and they are in agreement.  CODE STATUS: Full code  TOTAL TIME TAKING CARE OF THIS PATIENT: 40 minutes.    Demetrios Loll M.D on 10/20/2017 at 8:12 PM  Between 7am to 6pm - Pager - [phone]  After 6pm go to [email or web address] - Proofreader  [facility]  Office  [phone]  CC: Primary care physician; Idelle Crouch, MD   Note: This dictation was prepared with Dragon dictation along with smaller phrase technology. Any transcriptional errors that result from this process are unin

## 2017-10-20 NOTE — ED Triage Notes (Signed)
Patient presents to the ED after being called by PCP to come to the ED due to abnormal labwork that was performed this am.  Patient's kidney function had decreased and his WBC count had increased per wife.  Patient was in the ED on Friday for pancreatitis. Patient reports abdominal pain, lack of appetite, and weakness at this time.

## 2017-10-20 NOTE — ED Triage Notes (Signed)
First Nurse Note:  Spent to ED by Dr. Doy Hutching for ED evaluation of elevated WBC and elevated BUN/ CR.  Dr. Doy Hutching wishes to be contacted by EDP

## 2017-10-20 NOTE — ED Provider Notes (Signed)
[facility] Emergency Department Provider Note   ____________________________________________   First MD Initiated Contact with Patient 10/20/17 1943     (approximate)  I have reviewed the triage vital signs and the nursing notes.   HISTORY  Chief Complaint Abnormal Lab   HPI [name] [name] is a 77 y.o. male patient had pancreatitis several days ago.  He had some lab work this morning and is primary care doctor called him to come to the emergency room because of the abnormalities and lab work.  His GFR has gone down and his white count has gone up.  On arrival in the emergency room patient complains of belly pain.  He has no right upper quadrant pain the pain is just diffuse especially in the mid of middle of his abdomen.  Is made worse with palpation.  He has some nausea.  He is not eating much.  It is moderate and achy.  He has no pain elsewhere at the present time.   Past Medical History:  Diagnosis Date  . Anemia   . Arthritis   . CAD (coronary artery disease)   . Chronic kidney disease    CKD stage 3  . DDD (degenerative disc disease), thoracic   . Diabetes mellitus without complication ([place])   . GERD (gastroesophageal reflux disease)   . Heart murmur   . History of heart artery stent 2014   Per patient, [name] put in 2 stents in 2014.  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 Hyperlipidemia   . Hypertension   . Hypothyroidism   . Macular degeneration   . Myocardial infarction [facility]) 1999   Per Pt, MI in 1999.  [name] PVD (peripheral vascular disease) [facility])     Patient Active Problem List   Diagnosis Date Noted  . Acute pancreatitis 10/20/2017  . Chest pain 12/08/2015  . Bradycardia 12/08/2015  . Generalized weakness 12/08/2015  . Leukocytosis 12/08/2015  . Chronic renal insufficiency, stage 3 (moderate) ([place]) 12/08/2015    Past Surgical History:  Procedure Laterality Date  . BACK SURGERY    . BICEPT TENODESIS Left  05/13/2017   Procedure: BICEPS TENODESIS;  Surgeon: [name], MD;  Location: [facility] ORS;  Service: Orthopedics;  Laterality: Left;  . CORONARY ANGIOPLASTY    . JOINT REPLACEMENT Bilateral    knee  . JOINT REPLACEMENT Right    hip  . SHOULDER ARTHROSCOPY WITH OPEN ROTATOR CUFF REPAIR Left 05/13/2017   Procedure: SHOULDER ARTHROSCOPY WITH OPEN ROTATOR CUFF REPAIR;  Surgeon: [name], MD;  Location: [facility] ORS;  Service: Orthopedics;  Laterality: Left;  subacromial decompression, debridement  . TONSILLECTOMY    . VASECTOMY      Prior to Admission medications   Medication Sig Start Date End Date Taking? Authorizing Provider  sildenafil (REVATIO) 20 MG tablet Take 40-60 mg by mouth daily as needed (erectile dysfunction).   Yes [provider]  aspirin EC 81 MG tablet Take 81 mg by mouth daily.     [provider]  Cyanocobalamin (B-12) 2500 MCG TABS Take 2,500 mcg by mouth daily.    [provider]  cyclobenzaprine (FLEXERIL) 10 MG tablet Take 10 mg by mouth 3 (three) times daily as needed for muscle spasms.  08/17/14   [provider]  furosemide (LASIX) 20 MG tablet Take 20 mg by mouth daily as needed for edema.    [provider]  gabapentin (NEURONTIN) 300 MG capsule Take 300  mg by mouth at bedtime.  05/22/15   [provider]  Insulin Glargine (LANTUS SOLOSTAR) 100 UNIT/ML Solostar Pen Inject 45 Units into the skin daily.  06/19/15 05/05/17  [provider]  levocetirizine (XYZAL) 5 MG tablet Take 5 mg by mouth every evening.    [provider]  levothyroxine (SYNTHROID, LEVOTHROID) 50 MCG tablet Take 50 mcg by mouth daily before breakfast.    [provider]  liraglutide (VICTOZA) 18 MG/3ML SOPN Inject 1.8 mg into the skin at bedtime.    [provider]  lisinopril (PRINIVIL,ZESTRIL) 5 MG tablet Take 5 mg by mouth at bedtime.  03/28/15 05/05/17  [provider]  metFORMIN (GLUCOPHAGE) 500 MG  tablet Take 500 mg by mouth 2 (two) times daily.  02/15/15   [provider]  Multiple Vitamin (MULTIVITAMIN WITH MINERALS) TABS tablet Take 1 tablet by mouth daily.    [provider]  Multiple Vitamins-Minerals (PRESERVISION AREDS 2) CAPS Take 1 capsule by mouth 2 (two) times daily.    [provider]  nitroGLYCERIN (NITROSTAT) 0.4 MG SL tablet Place 1 tablet under the tongue as needed. For chest pain 05/08/15   [provider]  nystatin (MYCOSTATIN/NYSTOP) powder Apply 1 g topically daily as needed (jock itch).    [provider]  omeprazole (PRILOSEC) 40 MG capsule Take 40 mg by mouth daily.  12/18/14   [provider]  ondansetron (ZOFRAN ODT) 4 MG disintegrating tablet Take 1 tablet (4 mg total) by mouth every 8 (eight) hours as needed for nausea or vomiting. 10/17/17   Alfred Levins, [place], MD  oxyCODONE (ROXICODONE) 5 MG immediate release tablet Take 1-2 tablets (5-10 mg total) by mouth every 4 (four) hours as needed. 05/13/17   Poggi, Marshall Cork, MD  oxyCODONE-acetaminophen (PERCOCET) 5-325 MG tablet Take 1 tablet by mouth every 4 (four) hours as needed for severe pain. 10/17/17   Rudene Re, MD  rosuvastatin (CRESTOR) 20 MG tablet Take 20 mg by mouth at bedtime.  09/02/14   [provider]    Allergies Sulfamethoxazole; Tomato; and Fish oil  No family history on file.  Social History Social History   Tobacco Use  . Smoking status: Former Smoker    Types: Cigarettes    Last attempt to quit: 01/04/1998    Years since quitting: 19.8  . Smokeless tobacco: Never Used  Substance Use Topics  . Alcohol use: Yes    Alcohol/week: 12.0 - 14.0 standard drinks    Types: 6 - 7 Cans of beer, 6 - 7 Standard drinks or equivalent per week  . Drug use: No    Review of Systems  Constitutional: No fever/chills Eyes: No visual changes. ENT: No sore throat. Cardiovascular: Denies chest pain. Respiratory: Denies shortness of  breath. Gastrointestinal: See HPI Genitourinary: Negative for dysuria. Musculoskeletal: Negative for back pain. Skin: Negative for rash. Neurological: Negative for headaches, focal weakness   ____________________________________________   PHYSICAL EXAM:  VITAL SIGNS: ED Triage Vitals  Enc Vitals Group     BP 10/20/17 1518 111/89     Pulse Rate 10/20/17 1518 (!) 104     Resp 10/20/17 1518 18     Temp 10/20/17 1518 99.4 F (37.4 C)     Temp Source 10/20/17 1518 Oral     SpO2 10/20/17 1518 94 %     Weight 10/20/17 1518 245 lb (111.1 kg)     Height 10/20/17 1518 6' (1.829 m)     Head Circumference --  Peak Flow --      Pain Score 10/20/17 1525 3     Pain Loc --      Pain Edu? --      Excl. in [place]? --    Constitutional: Alert and oriented. Well appearing and in moderate distress. Eyes: Conjunctivae are normal.  Head: Atraumatic. Nose: No congestion/rhinnorhea. Mouth/Throat: Mucous membranes are moist.  Oropharynx non-erythematous. Neck: No stridor. Cardiovascular: Normal rate, regular rhythm. Grossly normal heart sounds.  Good peripheral circulation. Respiratory: Normal respiratory effort.  No retractions. Lungs CTAB. Gastrointestinal: Soft moderately tender diffusely to palpation and percussion no distention. No abdominal bruits. No CVA tenderness. Musculoskeletal: No lower extremity tenderness nor edema.  No joint effusions. Neurologic:  Normal speech and language. No gross focal neurologic deficits are appreciated. Skin:  Skin is warm, dry and intact. No rash noted. Psychiatric: Mood and affect are normal. Speech and behavior are normal.  ____________________________________________   LABS (all labs ordered are listed, but only abnormal results are displayed)  Labs Reviewed  COMPREHENSIVE METABOLIC PANEL - Abnormal; Notable for the following components:      Result Value   CO2 21 (*)    Glucose, Bld 158 (*)    BUN 37 (*)    Creatinine, Ser 2.89 (*)    Calcium  8.5 (*)    AST 89 (*)    ALT 50 (*)    GFR calc non Af Amer 19 (*)    GFR calc Af Amer 22 (*)    All other components within normal limits  CBC - Abnormal; Notable for the following components:   WBC 17.8 (*)    RBC 3.67 (*)    Hemoglobin 11.5 (*)    HCT 34.0 (*)    All other components within normal limits  LIPASE, BLOOD  URINALYSIS, COMPLETE (UACMP) WITH MICROSCOPIC   ____________________________________________  EKG   ____________________________________________  RADIOLOGY  ED MD interpretation: CT shows worsening pancreatitis no sign of cholecystitis.  I reviewed the films  Official radiology report(s): Ct Abdomen Pelvis Wo Contrast  Result Date: 10/20/2017 CLINICAL DATA:  Upper abdominal and lower chest pain for 3 days with diaphoresis, fatigue, and low-grade fever, recently seen for pancreatitis, history of coronary artery disease post MI and coronary PTCA, stage III chronic kidney disease, hypertension, diabetes mellitus, former smoker EXAM: CT ABDOMEN AND PELVIS WITHOUT CONTRAST TECHNIQUE: Multidetector CT imaging of the abdomen and pelvis was performed following the standard protocol without IV contrast. Sagittal and coronal MPR images reconstructed from axial data set. COMPARISON:  10/17/2017 FINDINGS: Lower chest: Chronic interstitial changes at lung bases greater on RIGHT. Hepatobiliary: Dependent high attenuation gallbladder likely layering tiny gallstones. No gross gallbladder wall thickening by CT. Liver unremarkable. Pancreas: Peripancreatic edema again identified pancreatic head and proximal body consistent with acute pancreatitis. Distal pancreatic body and pancreatic tail normal appearance. Peripancreatic infiltrative changes appear increased since previous exam and now extend into the small bowel mesentery. No discrete fluid collection/abscess. Unable to assess for presence of pancreatic necrosis or patency of portal/superior mesenteric/splenic veins due the lack of  contrast. Spleen: Normal appearance Adrenals/Urinary Tract: Adrenal glands normal appearance. Persistent perinephric stranding, unchanged. No gross evidence of renal mass or hydronephrosis. Bladder and ureters unremarkable. Stomach/Bowel: Normal appendix. Sigmoid diverticulosis without evidence of diverticulitis. Wall thickening of the third portion of the duodenum secondary to adjacent pancreatitis. Stomach and remaining bowel loops normal appearance. Vascular/Lymphatic: Atherosclerotic calcifications aorta and iliac arteries. Aorta normal caliber. No adenopathy. Reproductive: Mild prostatic enlargement. Other: No free  air or free fluid.  No hernia. Musculoskeletal: No acute osseous findings. IMPRESSION: Again identified changes of acute pancreatitis involving the head and body of the pancreas, with increased peripancreatic edema since the previous exam now extending into the small bowel mesentery. Associated wall thickening of the third portion of the duodenum without obstruction. Dependent cholelithiasis within gallbladder. Sigmoid diverticulosis without evidence of diverticulitis. Aortic Atherosclerosis (ICD10-I70.0). Electronically Signed   By: Lavonia Dana M.D.   On: 10/20/2017 19:19    ____________________________________________   PROCEDURES  Procedure(s) performed:   Procedures  Critical Care performed:   ____________________________________________   INITIAL IMPRESSION / ASSESSMENT AND PLAN / ED COURSE  Gust with gastroenterology Dr. Bonna Gains.  She recommends fluids pain control at admission.  We will do this.  Clinical Course as of Oct 20 2013  Mon Oct 20, 2017  1928 AST(!): 89 [PM]    Clinical Course User Index [PM] Nena Polio, MD     ____________________________________________   FINAL CLINICAL IMPRESSION(S) / ED DIAGNOSES  Final diagnoses:  AKI (acute kidney injury) ([place])  Acute pancreatitis, unspecified complication status, unspecified pancreatitis type   Leukocytosis, unspecified type     ED Discharge Orders    None       Note:  This document was prepared using Dragon voice recognition software and may include unintentional dictation errors.    Nena Polio, MD 10/20/17 2018

## 2017-10-21 DIAGNOSIS — K851 Biliary acute pancreatitis without necrosis or infection: Principal | ICD-10-CM

## 2017-10-21 LAB — URINALYSIS, COMPLETE (UACMP) WITH MICROSCOPIC
BACTERIA UA: NONE SEEN
BILIRUBIN URINE: NEGATIVE
Glucose, UA: NEGATIVE mg/dL
KETONES UR: 5 mg/dL — AB
Leukocytes, UA: NEGATIVE
Nitrite: NEGATIVE
PH: 5 (ref 5.0–8.0)
Protein, ur: 30 mg/dL — AB
SPECIFIC GRAVITY, URINE: 1.012 (ref 1.005–1.030)

## 2017-10-21 LAB — CBC
HCT: 30.9 % — ABNORMAL LOW (ref 40.0–52.0)
Hemoglobin: 10.6 g/dL — ABNORMAL LOW (ref 13.0–18.0)
MCH: 32 pg (ref 26.0–34.0)
MCHC: 34.3 g/dL (ref 32.0–36.0)
MCV: 93.2 fL (ref 80.0–100.0)
Platelets: 180 10*3/uL (ref 150–440)
RBC: 3.31 MIL/uL — AB (ref 4.40–5.90)
RDW: 14.2 % (ref 11.5–14.5)
WBC: 13.7 10*3/uL — AB (ref 3.8–10.6)

## 2017-10-21 LAB — GLUCOSE, CAPILLARY
GLUCOSE-CAPILLARY: 104 mg/dL — AB (ref 70–99)
GLUCOSE-CAPILLARY: 120 mg/dL — AB (ref 70–99)
Glucose-Capillary: 104 mg/dL — ABNORMAL HIGH (ref 70–99)
Glucose-Capillary: 199 mg/dL — ABNORMAL HIGH (ref 70–99)

## 2017-10-21 LAB — BASIC METABOLIC PANEL
Anion gap: 7 (ref 5–15)
BUN: 31 mg/dL — ABNORMAL HIGH (ref 8–23)
CHLORIDE: 108 mmol/L (ref 98–111)
CO2: 21 mmol/L — AB (ref 22–32)
CREATININE: 2.19 mg/dL — AB (ref 0.61–1.24)
Calcium: 7.7 mg/dL — ABNORMAL LOW (ref 8.9–10.3)
GFR calc non Af Amer: 27 mL/min — ABNORMAL LOW (ref >60)
GFR, EST AFRICAN AMERICAN: 31 mL/min — AB (ref >60)
Glucose, Bld: 118 mg/dL — ABNORMAL HIGH (ref 70–99)
POTASSIUM: 3.6 mmol/L (ref 3.5–5.1)
SODIUM: 136 mmol/L (ref 135–145)

## 2017-10-21 LAB — TSH: TSH: 3.643 u[IU]/mL (ref 0.350–4.500)

## 2017-10-21 LAB — TRIGLYCERIDES: Triglycerides: 221 mg/dL — ABNORMAL HIGH (ref <150)

## 2017-10-21 MED ORDER — PIPERACILLIN-TAZOBACTAM 3.375 G IVPB
3.3750 g | Freq: Three times a day (TID) | INTRAVENOUS | Status: DC
  Administered 2017-10-21 – 2017-10-24 (×9): 3.375 g via INTRAVENOUS
  Filled 2017-10-21 (×9): qty 50

## 2017-10-21 NOTE — Consult Note (Signed)
[name] [name] , [name] [address], [place], [place], [place], [address] [address], [name], [name], [place], [address] Phone: [phone]  Fax: [phone]  Consultation  Referring Provider:    Dr [name] [name]  Primary Care Physician:  [name] Crouch, [name] Primary Gastroenterologist:  None          Reason for Consultation:     Acute pancreatitis.   Date of Admission:  10/20/2017 Date of Consultation:  10/21/2017         HPI:   [name] [name] is a 77 y.o. male came into the ER on 10/17/17 with chest pain ,Lipase found to be 4365, elevated AST and ALT  . Gall bladder sludge seen on RUQ USG. CBD 4.7 mm. Discharged home. CT abdomen on 10/17/17 showed features of acute pancreatitis, trace pericholecystic fluid.   Readmitted as his pain got worse . Repeat CT scan performed yesterday on 10/20/17 showed increased peri pancreatic edema and thickening of the third portion of the duodenum. Cholilithiasis. Calcium has dropped.   LFT's completely normal in 09/2017   Has had 3 episodes similarly, first episode was in 01[place] , 2nd episode July 4th , both were after eating , this episode began before he ate , always in the lower chest area, this the pain came on and lasted till he came into the ER, radiated to the lower part of the abdomen.   No family history of gall stones, he is in the room with son and friend. Associated with nausea and vomiting.   Follows with Cardiology. 3 drinks of alcohol a week , beer, does not smoke . No family history of pancreatitis or pancreatic cancer. 3 weeks back started on Glargine-Lixisenatide  Presently minimal pain . No fever  Hepatic Function Latest Ref Rng & Units 10/20/2017 10/17/2017 12/08/2015  Total Protein 6.5 - 8.1 g/dL 7.0 6.7 6.8  Albumin 3.5 - 5.0 g/dL 3.5 4.0 3.9  AST 15 - 41 U/L 89(H) 85(H) 19  ALT 0 - 44 U/L 50(H) 57(H) 16(L)  Alk Phosphatase 38 - 126 U/L 75 62 45  Total Bilirubin 0.3 - 1.2 mg/dL 1.1 0.6 <0.1(L)     Past  Medical History:  Diagnosis Date  . Anemia   . Arthritis   . CAD (coronary artery disease)   . Chronic kidney disease    CKD stage 3  . DDD (degenerative disc disease), thoracic   . Diabetes mellitus without complication ([name])   . GERD (gastroesophageal reflux disease)   . Heart murmur   . History of heart artery stent 2014   Per patient, [name] [name] put in 2 stents in 2014.  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 Hyperlipidemia   . Hypertension   . Hypothyroidism   . Macular degeneration   . Myocardial infarction [name]) 1999   Per Pt, MI in 1999.  [name] PVD (peripheral vascular disease) ([place])     Past Surgical History:  Procedure Laterality Date  . BACK SURGERY    . BICEPT TENODESIS Left 05/13/2017   Procedure: BICEPS TENODESIS;  Surgeon: [name] [name];  Location: [facility] ORS;  Service: Orthopedics;  Laterality: Left;  . CORONARY ANGIOPLASTY    . JOINT REPLACEMENT Bilateral    knee  . JOINT REPLACEMENT Right    hip  . SHOULDER ARTHROSCOPY WITH OPEN ROTATOR CUFF REPAIR Left 05/13/2017   Procedure: SHOULDER ARTHROSCOPY WITH OPEN ROTATOR CUFF REPAIR;  Surgeon: [name] Mull,  [name];  Location: [facility] ORS;  Service: Orthopedics;  Laterality: Left;  subacromial decompression, debridement  . TONSILLECTOMY    . VASECTOMY      Prior to Admission medications   Medication Sig Start Date End Date Taking? Authorizing Provider  aspirin EC 81 MG tablet Take 81 mg by mouth daily.    Yes Provider, Historical, [name]  Cyanocobalamin (B-12) 2500 MCG TABS Take 2,500 mcg by mouth daily.   Yes Provider, Historical, [name]  cyclobenzaprine (FLEXERIL) 10 MG tablet Take 10 mg by mouth 3 (three) times daily as needed for muscle spasms.  08/17/14  Yes Provider, Historical, [name]  furosemide (LASIX) 20 MG tablet Take 20 mg by mouth daily as needed for edema.   Yes Provider, Historical, [name]  gabapentin (NEURONTIN) 300 MG capsule Take 300 mg by mouth at bedtime.  05/22/15  Yes Provider, Historical, [name]    Insulin Glargine-Lixisenatide (SOLIQUA) 100-33 UNT-MCG/ML SOPN Inject 46 Units into the skin daily.   Yes Provider, Historical, [name]  levocetirizine (XYZAL) 5 MG tablet Take 5 mg by mouth every evening.   Yes Provider, Historical, [name]  levothyroxine (SYNTHROID, LEVOTHROID) 50 MCG tablet Take 50 mcg by mouth daily before breakfast.   Yes Provider, Historical, [name]  lisinopril (PRINIVIL,ZESTRIL) 5 MG tablet Take 5 mg by mouth at bedtime.  03/28/15 10/20/17 Yes Provider, Historical, [name]  metFORMIN (GLUCOPHAGE) 500 MG tablet Take 500 mg by mouth 2 (two) times daily.  02/15/15  Yes Provider, Historical, [name]  Multiple Vitamin (MULTIVITAMIN WITH MINERALS) TABS tablet Take 1 tablet by mouth daily.   Yes Provider, Historical, [name]  Multiple Vitamins-Minerals (PRESERVISION AREDS 2) CAPS Take 1 capsule by mouth 2 (two) times daily.   Yes Provider, Historical, [name]  nitroGLYCERIN (NITROSTAT) 0.4 MG SL tablet Place 1 tablet under the tongue as needed. For chest pain 05/08/15  Yes Provider, Historical, [name]  nystatin (MYCOSTATIN/NYSTOP) powder Apply 1 g topically daily as needed (jock itch).   Yes Provider, Historical, [name]  omeprazole (PRILOSEC) 40 MG capsule Take 40 mg by mouth daily.  12/18/14  Yes Provider, Historical, [name]  ondansetron (ZOFRAN ODT) 4 MG disintegrating tablet Take 1 tablet (4 mg total) by mouth every 8 (eight) hours as needed for nausea or vomiting. 10/17/17  Yes [name] [name], [place], [name]  rosuvastatin (CRESTOR) 20 MG tablet Take 20 mg by mouth at bedtime.  09/02/14  Yes Provider, Historical, [name]  sildenafil (REVATIO) 20 MG tablet Take 40-60 mg by mouth daily as needed (erectile dysfunction).   Yes Provider, Historical, [name]  oxyCODONE (ROXICODONE) 5 MG immediate release tablet Take 1-2 tablets (5-10 mg total) by mouth every 4 (four) hours as needed. Patient not taking: Reported on 10/20/2017 05/13/17   Poggi, [name] Cork, [name]  oxyCODONE-acetaminophen (PERCOCET) 5-325 MG tablet Take 1 tablet by mouth every 4 (four) hours as  needed for severe pain. Patient not taking: Reported on 10/20/2017 10/17/17   [name] [name], [name]    History reviewed. No pertinent family history.   Social History   Tobacco Use  . Smoking status: Former Smoker    Types: Cigarettes    Last attempt to quit: 01/04/1998    Years since quitting: 19.8  . Smokeless tobacco: Never Used  Substance Use Topics  . Alcohol use: Yes    Alcohol/week: 12.0 - 14.0 standard drinks    Types: 6 - 7 Cans of beer, 6 - 7 Standard drinks or equivalent per week  . Drug use: No    Allergies as of 10/20/2017 - Review Complete  10/20/2017  Allergen Reaction Noted  . Sulfamethoxazole Hives 06/06/2015  . Tomato  04/24/2017  . Fish oil Rash 01/16/2017    Review of Systems:    All systems reviewed and negative except where noted in HPI.   Physical Exam:  Vital signs in last 24 hours: Temp:  [98.3 F (36.8 C)-101.5 F (38.6 C)] 98.3 F (36.8 C) (08/20 0546) Pulse Rate:  [85-104] 89 (08/20 0434) Resp:  [15-20] 15 (08/20 0434) BP: (111-163)/(65-89) 122/65 (08/20 0434) SpO2:  [93 %-100 %] 93 % (08/20 0434) Weight:  [111.1 kg-113 kg] 113 kg (08/19 2127) Last BM Date: 10/20/17 General:   Pleasant, cooperative in NAD Head:  Normocephalic and atraumatic. Eyes:   No icterus.   Conjunctiva pink. PERRLA. Ears:  Normal auditory acuity. Neck:  Supple; no masses or thyroidomegaly Lungs: Respirations even and unlabored. Lungs clear to auscultation bilaterally.   No wheezes, crackles, or rhonchi.  Heart:  Regular rate and rhythm;  Without murmur, clicks, rubs or gallops Abdomen:  Soft, nondistended, nontender. Normal bowel sounds. No appreciable masses or hepatomegaly.  No rebound or guarding.  Neurologic:  Alert and oriented x3;  grossly normal neurologically. Skin:  Intact without significant lesions or rashes. Cervical Nodes:  No significant cervical adenopathy. Psych:  Alert and cooperative. Normal affect.  LAB RESULTS: Recent Labs    10/20/17 1522  10/21/17 0456  WBC 17.8* 13.7*  HGB 11.5* 10.6*  HCT 34.0* 30.9*  PLT 210 180   BMET Recent Labs    10/20/17 1522 10/21/17 0456  NA 136 136  K 4.1 3.6  CL 105 108  CO2 21* 21*  GLUCOSE 158* 118*  BUN 37* 31*  CREATININE 2.89* 2.19*  CALCIUM 8.5* 7.7*   LFT Recent Labs    10/20/17 1522  PROT 7.0  ALBUMIN 3.5  AST 89*  ALT 50*  ALKPHOS 75  BILITOT 1.1   PT/INR No results for input(s): LABPROT, INR in the last 72 hours.  STUDIES: Ct Abdomen Pelvis Wo Contrast  Result Date: 10/20/2017 CLINICAL DATA:  Upper abdominal and lower chest pain for 3 days with diaphoresis, fatigue, and low-grade fever, recently seen for pancreatitis, history of coronary artery disease post MI and coronary PTCA, stage III chronic kidney disease, hypertension, diabetes mellitus, former smoker EXAM: CT ABDOMEN AND PELVIS WITHOUT CONTRAST TECHNIQUE: Multidetector CT imaging of the abdomen and pelvis was performed following the standard protocol without IV contrast. Sagittal and coronal MPR images reconstructed from axial data set. COMPARISON:  10/17/2017 FINDINGS: Lower chest: Chronic interstitial changes at lung bases greater on RIGHT. Hepatobiliary: Dependent high attenuation gallbladder likely layering tiny gallstones. No gross gallbladder wall thickening by CT. Liver unremarkable. Pancreas: Peripancreatic edema again identified pancreatic head and proximal body consistent with acute pancreatitis. Distal pancreatic body and pancreatic tail normal appearance. Peripancreatic infiltrative changes appear increased since previous exam and now extend into the small bowel mesentery. No discrete fluid collection/abscess. Unable to assess for presence of pancreatic necrosis or patency of portal/superior mesenteric/splenic veins due the lack of contrast. Spleen: Normal appearance Adrenals/Urinary Tract: Adrenal glands normal appearance. Persistent perinephric stranding, unchanged. No gross evidence of renal mass or  hydronephrosis. Bladder and ureters unremarkable. Stomach/Bowel: Normal appendix. Sigmoid diverticulosis without evidence of diverticulitis. Wall thickening of the third portion of the duodenum secondary to adjacent pancreatitis. Stomach and remaining bowel loops normal appearance. Vascular/Lymphatic: Atherosclerotic calcifications aorta and iliac arteries. Aorta normal caliber. No adenopathy. Reproductive: Mild prostatic enlargement. Other: No free air or free fluid.  No hernia.  Musculoskeletal: No acute osseous findings. IMPRESSION: Again identified changes of acute pancreatitis involving the head and body of the pancreas, with increased peripancreatic edema since the previous exam now extending into the small bowel mesentery. Associated wall thickening of the third portion of the duodenum without obstruction. Dependent cholelithiasis within gallbladder. Sigmoid diverticulosis without evidence of diverticulitis. Aortic Atherosclerosis (ICD10-I70.0). Electronically Signed   By: Lavonia Dana M.D.   On: 10/20/2017 19:19      Impression / Plan:   [name] [name] is a 77 y.o. y/o male admitted with pancreatitis. Likely etiology is gall stone pancreatitis as noted acute rise in transaminases and pain began after a meal .  Plan  1. Check TGL, IGG4,  2. Ringer lactate IV fluids  3. Analgesia 4. Consult surgery for cholecystectomy if would do prior to discharge or after.  5. Can start on clears 6. MRCP in 6 weeks to r/o any pancreatic abnormalities such as neoplasm that could have also triggered this episode.  7. Glargine-Lixisenatide <1 % chance of pancreatitis  Thank you for involving me in the care of this patient.      LOS: 1 day   [name] Bellows, [name]  10/21/2017, 9:50 AM

## 2017-10-21 NOTE — Progress Notes (Signed)
Pharmacy Antibiotic Note  [name] [name] is a 77 y.o. male admitted on 10/20/2017 with IAI.  Pharmacy has been consulted for Zosyn dosing.  Plan: Zosyn 3.375 gm IV Q8H EI  Height: 6' (182.9 cm) Weight: 249 lb 1.6 oz (113 kg) IBW/kg (Calculated) : 77.6  Temp (24hrs), Avg:99.6 F (37.6 C), Min:98.3 F (36.8 C), Max:101.5 F (38.6 C)  Recent Labs  Lab 10/17/17 1920 10/20/17 1522 10/21/17 0456  WBC 15.2* 17.8* 13.7*  CREATININE 1.69* 2.89* 2.19*    Estimated Creatinine Clearance: 36.1 mL/min (A) (by C-G formula based on SCr of 2.19 mg/dL (H)).    Allergies  Allergen Reactions  . Sulfamethoxazole Hives  . Tomato     Breaks out  . Fish Oil Rash    Antimicrobials this admission:   Dose adjustments this admission:   Microbiology results:  BCx:   UCx:    Sputum:    MRSA PCR:   Thank you for allowing pharmacy to be a part of this patient's care.  Laural Benes, Pharm.D., BCPS Clinical Pharmacist 10/21/2017 9:03 AM

## 2017-10-21 NOTE — Progress Notes (Signed)
[facility] [place] NAME: [name] [name]    MR#:  [id number]  DATE OF BIRTH:  May 06, 1940  SUBJECTIVE:  CHIEF COMPLAINT:   Chief Complaint  Patient presents with  . Abnormal Lab   Came with pancreatitis, still have the pain. He had fever last night.  REVIEW OF SYSTEMS:  CONSTITUTIONAL: No fever, fatigue or weakness.  EYES: No blurred or double vision.  EARS, NOSE, AND THROAT: No tinnitus or ear pain.  RESPIRATORY: No cough, shortness of breath, wheezing or hemoptysis.  CARDIOVASCULAR: No chest pain, orthopnea, edema.  GASTROINTESTINAL: No nausea, vomiting, diarrhea , had abdominal pain.  GENITOURINARY: No dysuria, hematuria.  ENDOCRINE: No polyuria, nocturia,  HEMATOLOGY: No anemia, easy bruising or bleeding SKIN: No rash or lesion. MUSCULOSKELETAL: No joint pain or arthritis.   NEUROLOGIC: No tingling, numbness, weakness.  PSYCHIATRY: No anxiety or depression.   ROS  DRUG ALLERGIES:   Allergies  Allergen Reactions  . Sulfamethoxazole Hives  . Tomato     Breaks out  . Fish Oil Rash    VITALS:  Blood pressure (!) 144/72, pulse 89, temperature 98.5 F (36.9 C), temperature source Oral, resp. rate 20, height 6' (1.829 m), weight 113 kg, SpO2 97 %.  PHYSICAL EXAMINATION:  GENERAL:  77 y.o.-year-old patient lying in the bed with no acute distress.  EYES: Pupils equal, round, reactive to light and accommodation. No scleral icterus. Extraocular muscles intact.  HEENT: Head atraumatic, normocephalic. Oropharynx and nasopharynx clear.  NECK:  Supple, no jugular venous distention. No thyroid enlargement, no tenderness.  LUNGS: Normal breath sounds bilaterally, no wheezing, rales,rhonchi or crepitation. No use of accessory muscles of respiration.  CARDIOVASCULAR: S1, S2 normal. No murmurs, rubs, or gallops.  ABDOMEN: Soft, tender, nondistended. Bowel sounds present. No organomegaly or mass.  EXTREMITIES: No pedal edema, cyanosis, or  clubbing.  NEUROLOGIC: Cranial nerves II through XII are intact. Muscle strength 5/5 in all extremities. Sensation intact. Gait not checked.  PSYCHIATRIC: The patient is alert and oriented x 3.  SKIN: No obvious rash, lesion, or ulcer.   Physical Exam LABORATORY PANEL:   CBC Recent Labs  Lab 10/21/17 0456  WBC 13.7*  HGB 10.6*  HCT 30.9*  PLT 180   ------------------------------------------------------------------------------------------------------------------  Chemistries  Recent Labs  Lab 10/20/17 1522 10/21/17 0456  NA 136 136  K 4.1 3.6  CL 105 108  CO2 21* 21*  GLUCOSE 158* 118*  BUN 37* 31*  CREATININE 2.89* 2.19*  CALCIUM 8.5* 7.7*  AST 89*  --   ALT 50*  --   ALKPHOS 75  --   BILITOT 1.1  --    ------------------------------------------------------------------------------------------------------------------  Cardiac Enzymes Recent Labs  Lab 10/17/17 1920 10/17/17 2211  TROPONINI <0.03 <0.03   ------------------------------------------------------------------------------------------------------------------  RADIOLOGY:  Ct Abdomen Pelvis Wo Contrast  Result Date: 10/20/2017 CLINICAL DATA:  Upper abdominal and lower chest pain for 3 days with diaphoresis, fatigue, and low-grade fever, recently seen for pancreatitis, history of coronary artery disease post MI and coronary PTCA, stage III chronic kidney disease, hypertension, diabetes mellitus, former smoker EXAM: CT ABDOMEN AND PELVIS WITHOUT CONTRAST TECHNIQUE: Multidetector CT imaging of the abdomen and pelvis was performed following the standard protocol without IV contrast. Sagittal and coronal MPR images reconstructed from axial data set. COMPARISON:  10/17/2017 FINDINGS: Lower chest: Chronic interstitial changes at lung bases greater on RIGHT. Hepatobiliary: Dependent high attenuation gallbladder likely layering tiny gallstones. No gross gallbladder wall thickening by CT. Liver unremarkable. Pancreas:  Peripancreatic edema again identified pancreatic head and proximal body consistent with acute pancreatitis. Distal pancreatic body and pancreatic tail normal appearance. Peripancreatic infiltrative changes appear increased since previous exam and now extend into the small bowel mesentery. No discrete fluid collection/abscess. Unable to assess for presence of pancreatic necrosis or patency of portal/superior mesenteric/splenic veins due the lack of contrast. Spleen: Normal appearance Adrenals/Urinary Tract: Adrenal glands normal appearance. Persistent perinephric stranding, unchanged. No gross evidence of renal mass or hydronephrosis. Bladder and ureters unremarkable. Stomach/Bowel: Normal appendix. Sigmoid diverticulosis without evidence of diverticulitis. Wall thickening of the third portion of the duodenum secondary to adjacent pancreatitis. Stomach and remaining bowel loops normal appearance. Vascular/Lymphatic: Atherosclerotic calcifications aorta and iliac arteries. Aorta normal caliber. No adenopathy. Reproductive: Mild prostatic enlargement. Other: No free air or free fluid.  No hernia. Musculoskeletal: No acute osseous findings. IMPRESSION: Again identified changes of acute pancreatitis involving the head and body of the pancreas, with increased peripancreatic edema since the previous exam now extending into the small bowel mesentery. Associated wall thickening of the third portion of the duodenum without obstruction. Dependent cholelithiasis within gallbladder. Sigmoid diverticulosis without evidence of diverticulitis. Aortic Atherosclerosis (ICD10-I70.0). Electronically Signed   By: Lavonia Dana M.D.   On: 10/20/2017 19:19    ASSESSMENT AND PLAN:   Active Problems:   Acute pancreatitis  * Acute pancreatitis, worsening abdominal pain N.p.o. except medication, IV fluid support, pain control and GI consult.  now started on liquid diet. Suspect due to gall stone, his TG is npt high. Consult surgical  to decide on cholecystectomy.  * fever   May be due to pancreatitis   IV zosyn for now, check Bl cx.  * Abnormal liver function test due to above.  Follow-up LFT.  * Acute renal failure on CKD stage III.  Hold lisinopril and Lasix, IV fluid support and follow-up BMP.  * Leukocytosis.  Possible due to reaction.  Follow-up CBC.  * Diabetes.  Start sliding scale and continue home Lantus   * Hypertension.  Hold lisinopril and Lasix.  Start Norvasc.  * Constipation.  Colace twice daily and Dulcolax as needed.   All the records are reviewed and case discussed with Care Management/Social Workerr. Management plans discussed with the patient, family and they are in agreement.  CODE STATUS: Full.  TOTAL TIME TAKING CARE OF THIS PATIENT: 35 minutes.    POSSIBLE D/C IN 1-2 DAYS, DEPENDING ON CLINICAL CONDITION.   Vaughan Basta M.D on 10/21/2017   Between 7am to 6pm - Pager - [phone]  After 6pm go to [email or web address] - password [email or web address] [facility]  Office  [phone]  CC: Primary care physician; Idelle Crouch, MD  Note: This dictation was prepared with Dragon dictation along with smaller phrase technology. Any transcriptional errors that result from this process are unintentional.

## 2017-10-21 NOTE — Progress Notes (Signed)
Initial Nutrition Assessment  DOCUMENTATION CODES:   Obesity unspecified  INTERVENTION:  RD provided "Pancreatitis Nutrition Therapy" and "Pancreatitis Label Reading Tips" handouts from the Academy of [employer] and Dietetics. Discussed role of the pancreas and how it helps digest and absorb food. Reviewed that foods high in fat are especially hard to digest and can cause pain. Encouraged patient to eat a low-fat diet where total fat intake is limited to 25-30% of Calories eaten per day. For patient this comes out to 65-75 grams of fat daily. Encouraged intake of small, low-fat meals throughout the day to help ease pain and encouraged patient to drink adequate fluids. Discouraged drinking alcoholic beverages. Provided a list of foods not recommended on a low-fat diet and discussed alternatives patient will enjoy. Also reviewed label reading tips, especially the importance of comparing portion size to amount patient is planning on eating. Teach back method used.  NUTRITION DIAGNOSIS:   Food and nutrition related knowledge deficit related to limited prior education as evidenced by per patient/family report.  GOAL:   Patient will meet greater than or equal to 90% of their needs  MONITOR:   PO intake, Diet advancement, Labs, Weight trends, I & O's  REASON FOR ASSESSMENT:   Malnutrition Screening Tool    ASSESSMENT:   77 year old male with PMHx of arthritis, HLD, HTN, PVD, anemia, macular degeneration, CAD, hx MI 1999, DM, thoracic DDD, hypothyroidism, CKD stage III, GERD who is now admitted with acute pancreatitis, etiology thought to be gallstones.   -Diet advanced to clear liquids today.  Met with patient and his wife at bedside. Patient reports decreased appetite for "a day or two since they have not been letting me eat." He reports he is tolerating the clear liquids well so far without any N/V or abdominal pain. He reports now that he knows he has acute pancreatitis, he has had  approximately 4-5 occurrences over the past year that he thought were something else. Each time he has a decreased appetite for a few days and then it returns to normal. He typically has very good appetite and intake. Patient denies any diarrhea, but there is a medium type 7 BM documented from today. He has not previously received education on pancreatitis nutrition therapy.  UBW 250 lbs (113.6 kg) and patient reports he is weight-stable.  Medications reviewed and include: gabapentin, Novolog 0-9 units TID, Novolog 0-5 units QHS, Lantus 45 units daily, levothyroxine, pantoprazole, vitamin B12 2500 micrograms daily, NS @ 75 mL/hr, Zosyn.  Labs reviewed: CBG 104-138, CO2 21, BUN 31, Creatinine 2.19, Triglycerides 221. On 8/19 AST 89, ALT 50.  Patient does not meet criteria for malnutrition.  NUTRITION - FOCUSED PHYSICAL EXAM:    Most Recent Value  Orbital Region  No depletion  Upper Arm Region  No depletion  Thoracic and Lumbar Region  No depletion  Buccal Region  No depletion  Temple Region  No depletion  Clavicle Bone Region  No depletion  Clavicle and Acromion Bone Region  No depletion  Scapular Bone Region  No depletion  Dorsal Hand  No depletion  Patellar Region  No depletion  Anterior Thigh Region  No depletion  Posterior Calf Region  No depletion  Edema (RD Assessment)  None  Hair  Reviewed  Eyes  Reviewed  Mouth  Reviewed  Skin  Reviewed  Nails  Reviewed     Diet Order:   Diet Order            Diet clear liquid  Room service appropriate? Yes; Fluid consistency: Thin  Diet effective now              EDUCATION NEEDS:   Education needs have been addressed  Skin:  Skin Assessment: Reviewed RN Assessment  Last BM:  10/21/2017 - medium type 7  Height:   Ht Readings from Last 1 Encounters:  10/20/17 6' (1.829 m)    Weight:   Wt Readings from Last 1 Encounters:  10/20/17 113 kg    Ideal Body Weight:  80.9 kg  BMI:  Body mass index is 33.78  kg/m.  Estimated Nutritional Needs:   Kcal:  2085-2270 (MSJ x 1.1-1.2)  Protein:  100-115 grams (0.9-1 grams/kg)  Fluid:  2-2.2 L/day (1 mL/kcal)  Willey Blade, MS, RD, LDN Office: [phone] Pager: [phone] After Hours/Weekend Pager: [phone]

## 2017-10-21 NOTE — Consult Note (Signed)
Patient ID: [name] [name], male   DOB: 03/15/1940, 77 y.o.   MRN: [id number]  HPI [name] [name] is a 77 y.o. male asked to see in consultation by Dr. Anselm Jungling for gallstone pancreatitis.  He came in to the emergency room yesterday for failure to thrive, chills profuse sweating and abdominal discomfort.  Patient reports that the symptoms have been present for the last 5 days actually came to the emergency room 3 days ago thinking it was a heart attack.  He deScribes his abdominal pain as mild to moderate intensity, dull intermittent.  No specific alleviating or aggravating factors.  CT scan personal review as well as ultrasound showing evidence of cholelithiasis.  There is evidence of a acute pancreatitis without evidence of necrotizing boning or abscess.  There is evidence of cholelithiasis on CT and sludge on the ultrasound.  Normal common bile duct.  Initial lipase was 4365 most recently is back to normal today does have a history of acute kidney injury with a creatinine of 2.8 AST and ALT mildly elevated with a normal bilirubin.  HPI  Past Medical History:  Diagnosis Date  . Anemia   . Arthritis   . CAD (coronary artery disease)   . Chronic kidney disease    CKD stage 3  . DDD (degenerative disc disease), thoracic   . Diabetes mellitus without complication ([place])   . GERD (gastroesophageal reflux disease)   . Heart murmur   . History of heart artery stent 2014   Per patient, [name] put in 2 stents in 2014.  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 Hyperlipidemia   . Hypertension   . Hypothyroidism   . Macular degeneration   . Myocardial infarction [facility]) 1999   Per Pt, MI in 1999.  [name] PVD (peripheral vascular disease) ([place])     Past Surgical History:  Procedure Laterality Date  . BACK SURGERY    . BICEPT TENODESIS Left 05/13/2017   Procedure: BICEPS TENODESIS;  Surgeon: [name], MD;  Location: [facility] ORS;  Service: Orthopedics;  Laterality: Left;  . CORONARY  ANGIOPLASTY    . JOINT REPLACEMENT Bilateral    knee  . JOINT REPLACEMENT Right    hip  . SHOULDER ARTHROSCOPY WITH OPEN ROTATOR CUFF REPAIR Left 05/13/2017   Procedure: SHOULDER ARTHROSCOPY WITH OPEN ROTATOR CUFF REPAIR;  Surgeon: [name], MD;  Location: [facility] ORS;  Service: Orthopedics;  Laterality: Left;  subacromial decompression, debridement  . TONSILLECTOMY    . VASECTOMY      History reviewed. No pertinent family history.  Social History Social History   Tobacco Use  . Smoking status: Former Smoker    Types: Cigarettes    Last attempt to quit: 01/04/1998    Years since quitting: 19.8  . Smokeless tobacco: Never Used  Substance Use Topics  . Alcohol use: Yes    Alcohol/week: 12.0 - 14.0 standard drinks    Types: 6 - 7 Cans of beer, 6 - 7 Standard drinks or equivalent per week  . Drug use: No    Allergies  Allergen Reactions  . Sulfamethoxazole Hives  . Tomato     Breaks out  . Fish Oil Rash    Current Facility-Administered Medications  Medication Dose Route Frequency Provider Last Rate Last Dose  . 0.9 %  sodium chloride infusion   Intravenous Continuous Demetrios Loll, MD 75 mL/hr at 10/21/17 1649    . acetaminophen (TYLENOL) tablet 650 mg  650 mg Oral  Q6H PRN Demetrios Loll, MD   650 mg at 10/21/17 1607   Or  . acetaminophen (TYLENOL) suppository 650 mg  650 mg Rectal Q6H PRN Demetrios Loll, MD      . albuterol (PROVENTIL) (2.5 MG/3ML) 0.083% nebulizer solution 2.5 mg  2.5 mg Nebulization Q2H PRN Demetrios Loll, MD      . aspirin EC tablet 81 mg  81 mg Oral Daily Demetrios Loll, MD   81 mg at 10/21/17 1045  . bisacodyl (DULCOLAX) EC tablet 5 mg  5 mg Oral Daily PRN Demetrios Loll, MD      . cyclobenzaprine (FLEXERIL) tablet 10 mg  10 mg Oral TID PRN Demetrios Loll, MD      . gabapentin (NEURONTIN) capsule 300 mg  300 mg Oral QHS Demetrios Loll, MD      . heparin injection 5,000 Units  5,000 Units Subcutaneous Q8H Demetrios Loll, MD   5,000 Units at 10/21/17 0450  . HYDROcodone-acetaminophen  (NORCO/VICODIN) 5-325 MG per tablet 1-2 tablet  1-2 tablet Oral Q4H PRN Demetrios Loll, MD      . insulin aspart (novoLOG) injection 0-5 Units  0-5 Units Subcutaneous QHS Demetrios Loll, MD      . insulin aspart (novoLOG) injection 0-9 Units  0-9 Units Subcutaneous TID WC Demetrios Loll, MD      . insulin glargine (LANTUS) injection 45 Units  45 Units Subcutaneous Daily Demetrios Loll, MD      . ketorolac (TORADOL) 15 MG/ML injection 15 mg  15 mg Intravenous Q6H PRN Demetrios Loll, MD      . levothyroxine (SYNTHROID, LEVOTHROID) tablet 50 mcg  50 mcg Oral QAC breakfast Demetrios Loll, MD   50 mcg at 10/21/17 0437  . loratadine (CLARITIN) tablet 10 mg  10 mg Oral QHS Demetrios Loll, MD      . nitroGLYCERIN (NITROSTAT) SL tablet 0.4 mg  0.4 mg Sublingual PRN Demetrios Loll, MD      . ondansetron [facility]) tablet 4 mg  4 mg Oral Q6H PRN Demetrios Loll, MD       Or  . ondansetron [facility]) injection 4 mg  4 mg Intravenous Q6H PRN Demetrios Loll, MD      . pantoprazole (PROTONIX) EC tablet 40 mg  40 mg Oral Daily Demetrios Loll, MD   40 mg at 10/21/17 1045  . piperacillin-tazobactam (ZOSYN) IVPB 3.375 g  3.375 g Intravenous Q8H Vaughan Basta, MD 12.5 mL/hr at 10/21/17 1045 3.375 g at 10/21/17 1045  . rosuvastatin (CRESTOR) tablet 20 mg  20 mg Oral QHS Demetrios Loll, MD      . senna-docusate (Senokot-S) tablet 1 tablet  1 tablet Oral QHS PRN Demetrios Loll, MD      . vitamin B-12 (CYANOCOBALAMIN) tablet 2,500 mcg  2,500 mcg Oral Daily Demetrios Loll, MD   2,500 mcg at 10/21/17 1045     Review of Systems Full ROS  was asked and was negative except for the information on the HPI  Physical Exam Blood pressure (!) 144/72, pulse 89, temperature 98.5 F (36.9 C), temperature source Oral, resp. rate 20, height 6' (1.829 m), weight 113 kg, SpO2 97 %. CONSTITUTIONAL: NAD EYES: Pupils are equal, round, and reactive to light, Sclera are non-icteric. EARS, NOSE, MOUTH AND THROAT: The oropharynx is clear. The oral mucosa is pink and moist. Hearing is  intact to voice. LYMPH NODES:  Lymph nodes in the neck are normal. RESPIRATORY:  Lungs are clear. There is normal respiratory effort, with equal breath sounds bilaterally, and without  pathologic use of accessory muscles. CARDIOVASCULAR: Heart is regular without murmurs, gallops, or rubs. GI: The abdomen is  soft, Mild epigastric tenderness, no peritonitis or Murphy. There are no palpable masses. There is no hepatosplenomegaly. There are normal bowel sounds in all quadrants. GU: Rectal deferred.   MUSCULOSKELETAL: Normal muscle strength and tone. No cyanosis or edema.   SKIN: Turgor is good and there are no pathologic skin lesions or ulcers. NEUROLOGIC: Motor and sensation is grossly normal. Cranial nerves are grossly intact. PSYCH:  Oriented to person, place and time. Affect is normal.  Data Reviewed I have personally reviewed the patient's imaging, laboratory findings and medical records.    Assessment/Plan  77 year old male with multiple comorbidities including coronary artery disease, diabetes, hypertension now presents with gallstone pancreatitis.  He still has persistent symptoms and I would like to wait another 24 to 48 hours before committing to a laparoscopic cholecystectomy.  We will tentatively place him on the schedule for Thursday for a lap chole.  Discussed with the patient in detail about my thought process. I discussed with the patient in detail about the procedure.  Risk benefit and possible complications including but not limited to: Bleeding, infection, bile duct injury.  He understands and he agrees with the plan. I do think that his cholecystectomy should be done before he is discharged.  There is no need for emergent surgical intervention at this time.  Case discussed in detail with patient, family and referring provider We will continue to follow him  Caroleen Hamman, MD FACS General Surgeon 10/21/2017, 5:22 PM

## 2017-10-22 LAB — COMPREHENSIVE METABOLIC PANEL
ALBUMIN: 2.9 g/dL — AB (ref 3.5–5.0)
ALT: 39 U/L (ref 0–44)
ANION GAP: 7 (ref 5–15)
AST: 47 U/L — ABNORMAL HIGH (ref 15–41)
Alkaline Phosphatase: 68 U/L (ref 38–126)
BILIRUBIN TOTAL: 0.9 mg/dL (ref 0.3–1.2)
BUN: 23 mg/dL (ref 8–23)
CO2: 23 mmol/L (ref 22–32)
Calcium: 8.1 mg/dL — ABNORMAL LOW (ref 8.9–10.3)
Chloride: 107 mmol/L (ref 98–111)
Creatinine, Ser: 1.97 mg/dL — ABNORMAL HIGH (ref 0.61–1.24)
GFR calc Af Amer: 36 mL/min — ABNORMAL LOW (ref >60)
GFR calc non Af Amer: 31 mL/min — ABNORMAL LOW (ref >60)
GLUCOSE: 154 mg/dL — AB (ref 70–99)
POTASSIUM: 3.8 mmol/L (ref 3.5–5.1)
Sodium: 137 mmol/L (ref 135–145)
TOTAL PROTEIN: 6.3 g/dL — AB (ref 6.5–8.1)

## 2017-10-22 LAB — CBC
HEMATOCRIT: 30.9 % — AB (ref 40.0–52.0)
Hemoglobin: 10.6 g/dL — ABNORMAL LOW (ref 13.0–18.0)
MCH: 31.5 pg (ref 26.0–34.0)
MCHC: 34.1 g/dL (ref 32.0–36.0)
MCV: 92.4 fL (ref 80.0–100.0)
Platelets: 209 10*3/uL (ref 150–440)
RBC: 3.35 MIL/uL — ABNORMAL LOW (ref 4.40–5.90)
RDW: 14.2 % (ref 11.5–14.5)
WBC: 11.8 10*3/uL — ABNORMAL HIGH (ref 3.8–10.6)

## 2017-10-22 LAB — GLUCOSE, CAPILLARY
GLUCOSE-CAPILLARY: 164 mg/dL — AB (ref 70–99)
Glucose-Capillary: 136 mg/dL — ABNORMAL HIGH (ref 70–99)
Glucose-Capillary: 177 mg/dL — ABNORMAL HIGH (ref 70–99)

## 2017-10-22 LAB — LIPASE, BLOOD: Lipase: 23 U/L (ref 11–51)

## 2017-10-22 LAB — IGG 4: IgG, Subclass 4: 6 mg/dL (ref 2–96)

## 2017-10-22 MED ORDER — CHLORHEXIDINE GLUCONATE CLOTH 2 % EX PADS
6.0000 | MEDICATED_PAD | Freq: Once | CUTANEOUS | Status: AC
  Administered 2017-10-22: 6 via TOPICAL

## 2017-10-22 MED ORDER — CHLORHEXIDINE GLUCONATE CLOTH 2 % EX PADS: 6.0000 | MEDICATED_PAD | Freq: Once | CUTANEOUS | Status: DC

## 2017-10-22 NOTE — Progress Notes (Signed)
CC: GS pancreatitis Subjective: Feeling better taking clears, no N./V WBC trending down, normal bili, lipase normal Creat improving slowly  Objective: Vital signs in last 24 hours: Temp:  [98 F (36.7 C)-100.9 F (38.3 C)] 98 F (36.7 C) (08/21 0338) Pulse Rate:  [83-93] 83 (08/21 0338) Resp:  [15-20] 15 (08/21 0338) BP: (136-153)/(69-72) 136/69 (08/21 0338) SpO2:  [96 %-98 %] 98 % (08/21 0338) Last BM Date: 10/28/17  Intake/Output from previous day: 08/20 0701 - 08/21 0700 In: 1869.8 [I.V.:1732.2; IV Piggyback:137.6] Out: 1200 [Urine:1200] Intake/Output this shift: No intake/output data recorded.  Physical exam:  NAD Abd: soft, no tenderness, no peritonitis, no murphy Ext: no edema and well perfused  Lab Results: CBC  Recent Labs    10/21/17 0456 10/22/17 0326  WBC 13.7* 11.8*  HGB 10.6* 10.6*  HCT 30.9* 30.9*  PLT 180 209   BMET Recent Labs    10/21/17 0456 10/22/17 0326  NA 136 137  K 3.6 3.8  CL 108 107  CO2 21* 23  GLUCOSE 118* 154*  BUN 31* 23  CREATININE 2.19* 1.97*  CALCIUM 7.7* 8.1*   PT/INR No results for input(s): LABPROT, INR in the last 72 hours. ABG No results for input(s): PHART, HCO3 in the last 72 hours.  Invalid input(s): PCO2, PO2  Studies/Results: Ct Abdomen Pelvis Wo Contrast  Result Date: 10/20/2017 CLINICAL DATA:  Upper abdominal and lower chest pain for 3 days with diaphoresis, fatigue, and low-grade fever, recently seen for pancreatitis, history of coronary artery disease post MI and coronary PTCA, stage III chronic kidney disease, hypertension, diabetes mellitus, former smoker EXAM: CT ABDOMEN AND PELVIS WITHOUT CONTRAST TECHNIQUE: Multidetector CT imaging of the abdomen and pelvis was performed following the standard protocol without IV contrast. Sagittal and coronal MPR images reconstructed from axial data set. COMPARISON:  10/17/2017 FINDINGS: Lower chest: Chronic interstitial changes at lung bases greater on RIGHT.  Hepatobiliary: Dependent high attenuation gallbladder likely layering tiny gallstones. No gross gallbladder wall thickening by CT. Liver unremarkable. Pancreas: Peripancreatic edema again identified pancreatic head and proximal body consistent with acute pancreatitis. Distal pancreatic body and pancreatic tail normal appearance. Peripancreatic infiltrative changes appear increased since previous exam and now extend into the small bowel mesentery. No discrete fluid collection/abscess. Unable to assess for presence of pancreatic necrosis or patency of portal/superior mesenteric/splenic veins due the lack of contrast. Spleen: Normal appearance Adrenals/Urinary Tract: Adrenal glands normal appearance. Persistent perinephric stranding, unchanged. No gross evidence of renal mass or hydronephrosis. Bladder and ureters unremarkable. Stomach/Bowel: Normal appendix. Sigmoid diverticulosis without evidence of diverticulitis. Wall thickening of the third portion of the duodenum secondary to adjacent pancreatitis. Stomach and remaining bowel loops normal appearance. Vascular/Lymphatic: Atherosclerotic calcifications aorta and iliac arteries. Aorta normal caliber. No adenopathy. Reproductive: Mild prostatic enlargement. Other: No free air or free fluid.  No hernia. Musculoskeletal: No acute osseous findings. IMPRESSION: Again identified changes of acute pancreatitis involving the head and body of the pancreas, with increased peripancreatic edema since the previous exam now extending into the small bowel mesentery. Associated wall thickening of the third portion of the duodenum without obstruction. Dependent cholelithiasis within gallbladder. Sigmoid diverticulosis without evidence of diverticulitis. Aortic Atherosclerosis (ICD10-I70.0). Electronically Signed   By: Lavonia Dana M.D.   On: 10/20/2017 19:19    Anti-infectives: Anti-infectives (From admission, onward)   Start     Dose/Rate Route Frequency Ordered Stop   10/21/17  1000  piperacillin-tazobactam (ZOSYN) IVPB 3.375 g     3.375 g 12.5 mL/hr over  240 Minutes Intravenous Every 8 hours 10/21/17 2518        Assessment/Plan:  GS pancreatitis improving We will perform lap chole in am The risks, benefits, complications, treatment options, and expected outcomes were discussed with the patient. The possibilities of bleeding, recurrent infection, finding a normal gallbladder, perforation of viscus organs, damage to surrounding structures, bile leak, abscess formation, needing a drain placed, the need for additional procedures, reaction to medication, pulmonary aspiration,  failure to diagnose a condition, the possible need to convert to an open procedure, and creating a complication requiring transfusion or operation were discussed with the patient. The patient and/or family concurred with the proposed plan, giving informed consent.   Caroleen Hamman, MD, [facility]  10/22/2017

## 2017-10-22 NOTE — Progress Notes (Signed)
[facility] NAME: [name] [name]    MR#:  [id number]  DATE OF BIRTH:  01/16/1941  SUBJECTIVE:  CHIEF COMPLAINT:   Chief Complaint  Patient presents with  . Abnormal Lab   Came with pancreatitis, not much pain now. Tolerating liquid diet.  REVIEW OF SYSTEMS:  CONSTITUTIONAL: No fever, fatigue or weakness.  EYES: No blurred or double vision.  EARS, NOSE, AND THROAT: No tinnitus or ear pain.  RESPIRATORY: No cough, shortness of breath, wheezing or hemoptysis.  CARDIOVASCULAR: No chest pain, orthopnea, edema.  GASTROINTESTINAL: No nausea, vomiting, diarrhea , had abdominal pain.  GENITOURINARY: No dysuria, hematuria.  ENDOCRINE: No polyuria, nocturia,  HEMATOLOGY: No anemia, easy bruising or bleeding SKIN: No rash or lesion. MUSCULOSKELETAL: No joint pain or arthritis.   NEUROLOGIC: No tingling, numbness, weakness.  PSYCHIATRY: No anxiety or depression.   Review of Systems  Constitutional: Positive for chills.    DRUG ALLERGIES:   Allergies  Allergen Reactions  . Sulfamethoxazole Hives  . Tomato     Breaks out  . Fish Oil Rash    VITALS:  Blood pressure 130/64, pulse 83, temperature 99.3 F (37.4 C), temperature source Oral, resp. rate 16, height 6' (1.829 m), weight 113 kg, SpO2 96 %.  PHYSICAL EXAMINATION:  GENERAL:  77 y.o.-year-old patient lying in the bed with no acute distress.  EYES: Pupils equal, round, reactive to light and accommodation. No scleral icterus. Extraocular muscles intact.  HEENT: Head atraumatic, normocephalic. Oropharynx and nasopharynx clear.  NECK:  Supple, no jugular venous distention. No thyroid enlargement, no tenderness.  LUNGS: Normal breath sounds bilaterally, no wheezing, rales,rhonchi or crepitation. No use of accessory muscles of respiration.  CARDIOVASCULAR: S1, S2 normal. No murmurs, rubs, or gallops.  ABDOMEN: Soft, tender, nondistended. Bowel sounds present. No organomegaly or  mass.  EXTREMITIES: No pedal edema, cyanosis, or clubbing.  NEUROLOGIC: Cranial nerves II through XII are intact. Muscle strength 5/5 in all extremities. Sensation intact. Gait not checked.  PSYCHIATRIC: The patient is alert and oriented x 3.  SKIN: No obvious rash, lesion, or ulcer.   Physical Exam LABORATORY PANEL:   CBC Recent Labs  Lab 10/22/17 0326  WBC 11.8*  HGB 10.6*  HCT 30.9*  PLT 209   ------------------------------------------------------------------------------------------------------------------  Chemistries  Recent Labs  Lab 10/22/17 0326  NA 137  K 3.8  CL 107  CO2 23  GLUCOSE 154*  BUN 23  CREATININE 1.97*  CALCIUM 8.1*  AST 47*  ALT 39  ALKPHOS 68  BILITOT 0.9   ------------------------------------------------------------------------------------------------------------------  Cardiac Enzymes Recent Labs  Lab 10/17/17 1920 10/17/17 2211  TROPONINI <0.03 <0.03   ------------------------------------------------------------------------------------------------------------------  RADIOLOGY:  No results found.  ASSESSMENT AND PLAN:   Active Problems:   Acute pancreatitis   Gallstone pancreatitis  * Acute pancreatitis, worsening abdominal pain N.p.o. except medication, IV fluid support, pain control and GI consult.  now started on liquid diet. Suspect due to gall stone, his TG is not high. Surgery is planning for cholecystectomy.  * fever   May be due to pancreatitis   IV zosyn for now, check Bl cx. Negative so far.  * Abnormal liver function test due to above.  Follow-up LFT.  * Acute renal failure on CKD stage III.  Hold lisinopril and Lasix, IV fluid support and follow-up BMP.  * Leukocytosis.  Possible due to reaction.  Follow-up CBC.  * Diabetes.  Start sliding scale and continue home Lantus   *  Hypertension.  Hold lisinopril and Lasix.  Start Norvasc.  * Constipation.  Colace twice daily and Dulcolax as needed.  *  lung nodule- need to have follow up CT in 1 year.  All the records are reviewed and case discussed with Care Management/Social Workerr. Management plans discussed with the patient, family and they are in agreement.  CODE STATUS: Full.  TOTAL TIME TAKING CARE OF THIS PATIENT: 35 minutes.    POSSIBLE D/C IN 1-2 DAYS, DEPENDING ON CLINICAL CONDITION.   Vaughan Basta M.D on 10/22/2017   Between 7am to 6pm - Pager - [phone]  After 6pm go to [email or web address] - password [email or web address] [facility]  Office  [phone]  CC: Primary care physician; Idelle Crouch, MD  Note: This dictation was prepared with Dragon dictation along with smaller phrase technology. Any transcriptional errors that result from this process are unintentional.

## 2017-10-22 NOTE — Plan of Care (Signed)

## 2017-10-22 NOTE — Progress Notes (Signed)
Jonathon Bellows , MD [address] [place], [place], [place], [address] [address], [place], [place], [place], [address] Phone: [phone]  Fax: [phone]   [name] [name] is being followed for gall stone pancreatitis  Day 1 of follow up   Subjective:  Doing well, eating, no pain    Objective: Vital signs in last 24 hours: Vitals:   10/21/17 0546 10/21/17 1617 10/21/17 1954 10/22/17 0338  BP:  (!) 144/72 (!) 153/72 136/69  Pulse:  89 93 83  Resp:  20 15 15   Temp: 98.3 F (36.8 C) 98.5 F (36.9 C) (!) 100.9 F (38.3 C) 98 F (36.7 C)  TempSrc:  Oral Oral Oral  SpO2:  97% 96% 98%  Weight:      Height:       Weight change:   Intake/Output Summary (Last 24 hours) at 10/22/2017 0626 Last data filed at 10/22/2017 0443 Gross per 24 hour  Intake 1869.83 ml  Output 1200 ml  Net 669.83 ml     Exam: Heart:: Regular rate and rhythm, S1S2 present or without murmur or extra heart sounds Lungs: normal, clear to auscultation and clear to auscultation and percussion Abdomen: soft, nontender, normal bowel sounds   Lab Results: @LABTEST2 @ Micro Results: Recent Results (from the past 240 hour(s))  CULTURE, BLOOD (ROUTINE X 2) w Reflex to ID Panel     Status: None (Preliminary result)   Collection Time: 10/21/17  9:02 AM  Result Value Ref Range Status   Specimen Description BLOOD  Final   Special Requests NONE  Final   Culture   Final    NO GROWTH < 24 HOURS Performed at [facility], [address], [place], [place] [address]    Report Status PENDING  Incomplete  CULTURE, BLOOD (ROUTINE X 2) w Reflex to ID Panel     Status: None (Preliminary result)   Collection Time: 10/21/17  9:15 AM  Result Value Ref Range Status   Specimen Description BLOOD  Final   Special Requests NONE  Final   Culture   Final    NO GROWTH < 24 HOURS Performed at [facility], [address], [place],  [address]    Report Status PENDING  Incomplete    Studies/Results: Ct Abdomen Pelvis Wo Contrast  Result Date: 10/20/2017 CLINICAL DATA:  Upper abdominal and lower chest pain for 3 days with diaphoresis, fatigue, and low-grade fever, recently seen for pancreatitis, history of coronary artery disease post MI and coronary PTCA, stage III chronic kidney disease, hypertension, diabetes mellitus, former smoker EXAM: CT ABDOMEN AND PELVIS WITHOUT CONTRAST TECHNIQUE: Multidetector CT imaging of the abdomen and pelvis was performed following the standard protocol without IV contrast. Sagittal and coronal MPR images reconstructed from axial data set. COMPARISON:  10/17/2017 FINDINGS: Lower chest: Chronic interstitial changes at lung bases greater on RIGHT. Hepatobiliary: Dependent high attenuation gallbladder likely layering tiny gallstones. No gross gallbladder wall thickening by CT. Liver unremarkable. Pancreas: Peripancreatic edema again identified pancreatic head and proximal body consistent with acute pancreatitis. Distal pancreatic body and pancreatic tail normal appearance. Peripancreatic infiltrative changes appear increased since previous exam and now extend into the small bowel mesentery. No discrete fluid collection/abscess. Unable to assess for presence of pancreatic necrosis or patency of portal/superior mesenteric/splenic veins due the lack of contrast. Spleen: Normal appearance Adrenals/Urinary Tract: Adrenal glands normal appearance. Persistent perinephric stranding, unchanged. No gross evidence of renal mass or hydronephrosis. Bladder and ureters unremarkable. Stomach/Bowel: Normal appendix. Sigmoid diverticulosis without  evidence of diverticulitis. Wall thickening of the third portion of the duodenum secondary to adjacent pancreatitis. Stomach and remaining bowel loops normal appearance. Vascular/Lymphatic: Atherosclerotic calcifications aorta and iliac arteries. Aorta normal caliber. No adenopathy. Reproductive: Mild prostatic enlargement. Other:  No free air or free fluid.  No hernia. Musculoskeletal: No acute osseous findings. IMPRESSION: Again identified changes of acute pancreatitis involving the head and body of the pancreas, with increased peripancreatic edema since the previous exam now extending into the small bowel mesentery. Associated wall thickening of the third portion of the duodenum without obstruction. Dependent cholelithiasis within gallbladder. Sigmoid diverticulosis without evidence of diverticulitis. Aortic Atherosclerosis (ICD10-I70.0). Electronically Signed   By: Lavonia Dana M.D.   On: 10/20/2017 19:19   Medications: I have reviewed the patient's current medications. Scheduled Meds: . aspirin EC  81 mg Oral Daily  . Chlorhexidine Gluconate Cloth  6 each Topical Once   And  . Chlorhexidine Gluconate Cloth  6 each Topical Once  . gabapentin  300 mg Oral QHS  . heparin  5,000 Units Subcutaneous Q8H  . insulin aspart  0-5 Units Subcutaneous QHS  . insulin aspart  0-9 Units Subcutaneous TID WC  . insulin glargine  45 Units Subcutaneous Daily  . levothyroxine  50 mcg Oral QAC breakfast  . loratadine  10 mg Oral QHS  . pantoprazole  40 mg Oral Daily  . rosuvastatin  20 mg Oral QHS  . vitamin B-12  2,500 mcg Oral Daily   Continuous Infusions: . sodium chloride 75 mL/hr at 10/22/17 0033  . piperacillin-tazobactam (ZOSYN)  IV 3.375 g (10/22/17 0301)   PRN Meds:.acetaminophen **OR** acetaminophen, albuterol, bisacodyl, cyclobenzaprine, HYDROcodone-acetaminophen, ketorolac, nitroGLYCERIN, ondansetron **OR** ondansetron (ZOFRAN) IV, senna-docusate   Assessment: Active Problems:   Acute pancreatitis   Gallstone pancreatitis   [name] [name] is a 77 y.o. y/o male admitted with gall stone pancreatitis. TSH, IGG4 normal- TGL 221   Plan  1.  MRCP in 6 weeks to r/o any pancreatic abnormalities such as neoplasm that could have also triggered this episode 2. Advance diet as tolerated 3. Cholecystectomy timing as per Dr  Barb Merino recs.    I will sign off.  Please call me if any further GI concerns or questions.  We would like to thank you for the opportunity to participate in the care of [name] [name].      LOS: 2 days   Jonathon Bellows, MD 10/22/2017, 9:38 AM

## 2017-10-23 ENCOUNTER — Ambulatory Visit: Admission: RE | Admit: 2017-10-23 | Payer: Medicare Other | Source: Ambulatory Visit | Admitting: Surgery

## 2017-10-23 ENCOUNTER — Inpatient Hospital Stay: Payer: Medicare Other | Admitting: Registered Nurse

## 2017-10-23 ENCOUNTER — Inpatient Hospital Stay: Payer: Medicare Other

## 2017-10-23 ENCOUNTER — Encounter: Payer: Self-pay | Admitting: Anesthesiology

## 2017-10-23 ENCOUNTER — Encounter
Admission: EM | Disposition: A | Discharge status: Home / Self Care | Payer: Self-pay | Source: Home / Self Care | Attending: Internal Medicine

## 2017-10-23 DIAGNOSIS — K429 Umbilical hernia without obstruction or gangrene: Secondary | ICD-10-CM

## 2017-10-23 HISTORY — PX: CHOLECYSTECTOMY: SHX55

## 2017-10-23 HISTORY — PX: UMBILICAL HERNIA REPAIR: SHX196

## 2017-10-23 LAB — SURGICAL PCR SCREEN
MRSA, PCR: NEGATIVE
STAPHYLOCOCCUS AUREUS: NEGATIVE

## 2017-10-23 LAB — GLUCOSE, CAPILLARY
GLUCOSE-CAPILLARY: 278 mg/dL — AB (ref 70–99)
Glucose-Capillary: 139 mg/dL — ABNORMAL HIGH (ref 70–99)
Glucose-Capillary: 146 mg/dL — ABNORMAL HIGH (ref 70–99)
Glucose-Capillary: 213 mg/dL — ABNORMAL HIGH (ref 70–99)

## 2017-10-23 SURGERY — LAPAROSCOPIC CHOLECYSTECTOMY
Anesthesia: General | Wound class: Contaminated

## 2017-10-23 MED ORDER — SODIUM CHLORIDE 0.9 % IJ SOLN
INTRAMUSCULAR | Status: DC | PRN
  Administered 2017-10-23: 15 mL

## 2017-10-23 MED ORDER — BUPIVACAINE-EPINEPHRINE 0.25% -1:200000 IJ SOLN
INTRAMUSCULAR | Status: DC | PRN
  Administered 2017-10-23: 30 mL

## 2017-10-23 MED ORDER — PROPOFOL 10 MG/ML IV BOLUS
INTRAVENOUS | Status: DC | PRN
  Administered 2017-10-23: 180 mg via INTRAVENOUS

## 2017-10-23 MED ORDER — SODIUM CHLORIDE 0.9 % IV SOLN
INTRAVENOUS | Status: DC | PRN
  Administered 2017-10-23: 10 mL

## 2017-10-23 MED ORDER — LIDOCAINE HCL (PF) 2 % IJ SOLN
INTRAMUSCULAR | Status: AC
  Filled 2017-10-23: qty 10

## 2017-10-23 MED ORDER — DEXAMETHASONE SODIUM PHOSPHATE 10 MG/ML IJ SOLN
INTRAMUSCULAR | Status: AC
  Filled 2017-10-23: qty 1

## 2017-10-23 MED ORDER — ONDANSETRON HCL 4 MG/2ML IJ SOLN
INTRAMUSCULAR | Status: DC | PRN
  Administered 2017-10-23: 4 mg via INTRAVENOUS

## 2017-10-23 MED ORDER — ONDANSETRON HCL 4 MG/2ML IJ SOLN
INTRAMUSCULAR | Status: AC
  Filled 2017-10-23: qty 2

## 2017-10-23 MED ORDER — SUGAMMADEX SODIUM 500 MG/5ML IV SOLN
INTRAVENOUS | Status: DC | PRN
  Administered 2017-10-23: 500 mg via INTRAVENOUS

## 2017-10-23 MED ORDER — ACETAMINOPHEN 10 MG/ML IV SOLN
INTRAVENOUS | Status: DC | PRN
  Administered 2017-10-23: 1000 mg via INTRAVENOUS

## 2017-10-23 MED ORDER — FENTANYL CITRATE (PF) 100 MCG/2ML IJ SOLN
INTRAMUSCULAR | Status: AC
  Filled 2017-10-23: qty 2

## 2017-10-23 MED ORDER — FENTANYL CITRATE (PF) 100 MCG/2ML IJ SOLN
INTRAMUSCULAR | Status: DC | PRN
  Administered 2017-10-23 (×3): 50 ug via INTRAVENOUS

## 2017-10-23 MED ORDER — PROPOFOL 10 MG/ML IV BOLUS
INTRAVENOUS | Status: AC
  Filled 2017-10-23: qty 20

## 2017-10-23 MED ORDER — ROCURONIUM BROMIDE 50 MG/5ML IV SOLN
INTRAVENOUS | Status: AC
  Filled 2017-10-23: qty 1

## 2017-10-23 MED ORDER — BUPIVACAINE-EPINEPHRINE (PF) 0.25% -1:200000 IJ SOLN
INTRAMUSCULAR | Status: AC
  Filled 2017-10-23: qty 30

## 2017-10-23 MED ORDER — OXYCODONE HCL 5 MG/5ML PO SOLN: 5.0000 mg | Freq: Once | ORAL | Status: DC | PRN

## 2017-10-23 MED ORDER — PHENYLEPHRINE HCL 10 MG/ML IJ SOLN
INTRAMUSCULAR | Status: DC | PRN
  Administered 2017-10-23: 200 ug via INTRAVENOUS

## 2017-10-23 MED ORDER — ROCURONIUM BROMIDE 100 MG/10ML IV SOLN
INTRAVENOUS | Status: DC | PRN
  Administered 2017-10-23: 50 mg via INTRAVENOUS
  Administered 2017-10-23: 20 mg via INTRAVENOUS

## 2017-10-23 MED ORDER — FENTANYL CITRATE (PF) 100 MCG/2ML IJ SOLN: 25.0000 ug | INTRAMUSCULAR | Status: DC | PRN

## 2017-10-23 MED ORDER — LIDOCAINE HCL (CARDIAC) PF 100 MG/5ML IV SOSY
PREFILLED_SYRINGE | INTRAVENOUS | Status: DC | PRN
  Administered 2017-10-23: 100 mg via INTRAVENOUS

## 2017-10-23 MED ORDER — OXYCODONE HCL 5 MG PO TABS: 5.0000 mg | ORAL_TABLET | Freq: Once | ORAL | Status: DC | PRN

## 2017-10-23 MED ORDER — ACETAMINOPHEN 10 MG/ML IV SOLN
INTRAVENOUS | Status: AC
  Filled 2017-10-23: qty 100

## 2017-10-23 MED ORDER — SUGAMMADEX SODIUM 500 MG/5ML IV SOLN
INTRAVENOUS | Status: AC
  Filled 2017-10-23: qty 5

## 2017-10-23 MED ORDER — DEXAMETHASONE SODIUM PHOSPHATE 10 MG/ML IJ SOLN
INTRAMUSCULAR | Status: DC | PRN
  Administered 2017-10-23: 5 mg via INTRAVENOUS

## 2017-10-23 MED ORDER — MUPIROCIN 2 % EX OINT
1.0000 "application " | TOPICAL_OINTMENT | Freq: Two times a day (BID) | CUTANEOUS | Status: DC
  Administered 2017-10-23 (×2): 1 via NASAL
  Filled 2017-10-23: qty 22

## 2017-10-23 SURGICAL SUPPLY — 47 items
APPLICATOR COTTON TIP 6 STRL (MISCELLANEOUS) ×2 IMPLANT
APPLICATOR COTTON TIP 6IN STRL (MISCELLANEOUS) ×3
APPLIER CLIP 5 13 M/L LIGAMAX5 (MISCELLANEOUS) ×3
BLADE SURG 15 STRL LF DISP TIS (BLADE) ×2 IMPLANT
BLADE SURG 15 STRL SS (BLADE) ×1
CANISTER SUCT 1200ML W/VALVE (MISCELLANEOUS) ×3 IMPLANT
CHLORAPREP W/TINT 26ML (MISCELLANEOUS) ×3 IMPLANT
CHOLANGIOGRAM CATH TAUT (CATHETERS) ×3 IMPLANT
CLEANER CAUTERY TIP 5X5 PAD (MISCELLANEOUS) IMPLANT
CLIP APPLIE 5 13 M/L LIGAMAX5 (MISCELLANEOUS) ×2 IMPLANT
DECANTER SPIKE VIAL GLASS SM (MISCELLANEOUS) ×6 IMPLANT
DERMABOND ADVANCED (GAUZE/BANDAGES/DRESSINGS) ×1
DERMABOND ADVANCED .7 DNX12 (GAUZE/BANDAGES/DRESSINGS) ×2 IMPLANT
DRAPE C-ARM XRAY 36X54 (DRAPES) ×6 IMPLANT
ELECT CAUTERY BLADE 6.4 (BLADE) ×3 IMPLANT
ELECT REM PT RETURN 9FT ADLT (ELECTROSURGICAL) ×3
ELECTRODE REM PT RTRN 9FT ADLT (ELECTROSURGICAL) ×2 IMPLANT
GLOVE BIO SURGEON STRL SZ7 (GLOVE) ×12 IMPLANT
GOWN STRL REUS W/ TWL LRG LVL3 (GOWN DISPOSABLE) ×6 IMPLANT
GOWN STRL REUS W/TWL LRG LVL3 (GOWN DISPOSABLE) ×3
IRRIGATION STRYKERFLOW (MISCELLANEOUS) ×2 IMPLANT
IRRIGATOR STRYKERFLOW (MISCELLANEOUS) ×3
IV CATH ANGIO 12GX3 LT BLUE (NEEDLE) ×3 IMPLANT
IV NS 1000ML (IV SOLUTION) ×1
IV NS 1000ML BAXH (IV SOLUTION) ×2 IMPLANT
L-HOOK LAP DISP 36CM (ELECTROSURGICAL) ×3
LHOOK LAP DISP 36CM (ELECTROSURGICAL) ×2 IMPLANT
NEEDLE HYPO 22GX1.5 SAFETY (NEEDLE) ×3 IMPLANT
NS IRRIG 500ML POUR BTL (IV SOLUTION) ×3 IMPLANT
PACK LAP CHOLECYSTECTOMY (MISCELLANEOUS) ×3 IMPLANT
PAD CLEANER CAUTERY TIP 5X5 (MISCELLANEOUS)
PENCIL ELECTRO HAND CTR (MISCELLANEOUS) ×3 IMPLANT
POUCH SPECIMEN RETRIEVAL 10MM (ENDOMECHANICALS) ×3 IMPLANT
SCISSORS METZENBAUM CVD 33 (INSTRUMENTS) ×3 IMPLANT
SLEEVE ENDOPATH XCEL 5M (ENDOMECHANICALS) ×6 IMPLANT
SOL ANTI-FOG 6CC FOG-OUT (MISCELLANEOUS) ×2 IMPLANT
SOL FOG-OUT ANTI-FOG 6CC (MISCELLANEOUS) ×1
SPONGE LAP 18X18 RF (DISPOSABLE) ×3 IMPLANT
STOPCOCK 4 WAY LG BORE MALE ST (IV SETS) IMPLANT
SUT ETHIBOND 0 MO6 C/R (SUTURE) ×3 IMPLANT
SUT MNCRL AB 4-0 PS2 18 (SUTURE) ×6 IMPLANT
SUT VICRYL 0 AB UR-6 (SUTURE) ×6 IMPLANT
SYR 20CC LL (SYRINGE) ×3 IMPLANT
TROCAR XCEL BLUNT TIP 100MML (ENDOMECHANICALS) ×3 IMPLANT
TROCAR XCEL NON-BLD 5MMX100MML (ENDOMECHANICALS) ×3 IMPLANT
TUBING INSUFFLATION (TUBING) ×3 IMPLANT
WATER STERILE IRR 1000ML POUR (IV SOLUTION) IMPLANT

## 2017-10-23 NOTE — Anesthesia Procedure Notes (Signed)
Procedure Name: Intubation Date/Time: 10/23/2017 9:32 AM Performed by: Doreen Salvage, CRNA Pre-anesthesia Checklist: Patient identified, Patient being monitored, Timeout performed, Emergency Drugs available and Suction available Patient Re-evaluated:Patient Re-evaluated prior to induction Oxygen Delivery Method: Circle system utilized Preoxygenation: Pre-oxygenation with 100% oxygen Induction Type: IV induction Ventilation: Mask ventilation without difficulty Laryngoscope Size: Mac and 3 Grade View: Grade I Tube type: Oral Tube size: 7.5 mm Number of attempts: 1 Airway Equipment and Method: Stylet Placement Confirmation: ETT inserted through vocal cords under direct vision,  positive ETCO2 and breath sounds checked- equal and bilateral Secured at: 24 (At the lip) cm Tube secured with: Tape Dental Injury: Teeth and Oropharynx as per pre-operative assessment

## 2017-10-23 NOTE — Op Note (Addendum)
Laparoscopic Cholecystectomy With IOC  Pre-operative Diagnosis: Gallstone pancreatitis  Post-operative Diagnosis:Same  Procedure: LaparoScopic cholecystectomy with intraoperative cholangiogram Umbilical hernia repair  Surgeon: Caroleen Hamman, MD FACS  Anesthesia: Gen. with endotracheal tube  Assistant: Mr. Olean Ree, PA-C (required for holding the camera and exposure)  Findings: Acute Cholecystitis Cholangiogram showing no evidence of filling defects.  Contrast reaches the duodenum and there is no evidence of biliary injuries  Estimated Blood Loss: 5cc            Specimens: Gallbladder           Complications: none   Procedure Details  The patient was seen again in the Holding Room. The benefits, complications, treatment options, and expected outcomes were discussed with the patient. The risks of bleeding, infection, recurrence of symptoms, failure to resolve symptoms, bile duct damage, bile duct leak, retained common bile duct stone, bowel injury, any of which could require further surgery and/or ERCP, stent, or papillotomy were reviewed with the patient. The likelihood of improving the patient's symptoms with return to their baseline status is good.  The patient and/or family concurred with the proposed plan, giving informed consent.  The patient was taken to Operating Room, identified as Erline Hau and the procedure verified as Laparoscopic Cholecystectomy.  A Time Out was held and the above information confirmed.  Prior to the induction of general anesthesia, antibiotic prophylaxis was administered. VTE prophylaxis was in place. General endotracheal anesthesia was then administered and tolerated well. After the induction, the abdomen was prepped with Chloraprep and draped in the sterile fashion. The patient was positioned in the supine position. Umbilical hernia was identified and incision was created around umbilicus The hernia sac was dissected and a Cut down technique was used to  enter the abdominal cavity via the hernia defect. A Hasson trochar was placed after two Ethibond stitches were anchored to the fascia. Pneumoperitoneum was then created with CO2 and tolerated well without any adverse changes in the patient's vital signs.  Three 5-mm ports were placed in the right upper quadrant all under direct vision. All skin incisions  were infiltrated with a local anesthetic agent before making the incision and placing the trocars.   The patient was positioned  in reverse Trendelenburg, tilted slightly to the patient's left.  The gallbladder was identified, the fundus grasped and retracted cephalad. Adhesions were lysed bluntly. The infundibulum was grasped and retracted laterally, exposing the peritoneum overlying the triangle of Calot. This was then divided and exposed in a blunt fashion. An extended critical view of the cystic duct and cystic artery was obtained.  The cystic duct was clearly identified and bluntly dissected.   A cholangiogram was performed after performing a ductotomy and insert and a cholangiocatheter V at the cystic duct.  Contrast was injected and multiple fluoroscopic views were obtained showing evidence of good flow of contrast into the duodenum.  No evidence of filling defects or evidence of biliary injuries.  The catheter and clip were removed in the standard fashion.   Artery and duct were double clipped and divided.  The gallbladder was taken from the gallbladder fossa in a retrograde fashion with the electrocautery. The gallbladder was removed and placed in an Endocatch bag. The liver bed was irrigated and inspected. Hemostasis was achieved with the electrocautery. Copious irrigation was utilized and was repeatedly aspirated until clear.  The gallbladder and Endocatch sac were then removed through a port site.   Inspection of the right upper quadrant was performed. No bleeding, bile  duct injury or leak, or bowel injury was noted. Pneumoperitoneum was  released.  The umbilical hernia  was closed with interrumpted 0 Ethibond sutures. 4-0 subcuticular Monocryl was used to close the skin. Dermabond was  applied.  The patient was then extubated and brought to the recovery room in stable condition. Sponge, lap, and needle counts were correct at closure and at the conclusion of the case.               Caroleen Hamman, MD, FACS

## 2017-10-23 NOTE — Progress Notes (Signed)
Pt transferred to operating room. Pt A&O x 4 he identified procedure, risk, and benefits.

## 2017-10-23 NOTE — Plan of Care (Signed)

## 2017-10-23 NOTE — Transfer of Care (Signed)
Immediate Anesthesia Transfer of Care Note  Patient: Erline Hau  Procedure(s) Performed: Procedure(s): LAPAROSCOPIC CHOLECYSTECTOMY (N/A) HERNIA REPAIR UMBILICAL ADULT  Patient Location: PACU  Anesthesia Type:General  Level of Consciousness: sedated  Airway & Oxygen Therapy: Patient Spontanous Breathing and Patient connected to face mask oxygen  Post-op Assessment: Report given to RN and Post -op Vital signs reviewed and stable  Post vital signs: Reviewed and stable  Last Vitals:  Vitals:   10/23/17 0848 10/23/17 1051  BP: (!) 151/77 (!) 119/59  Pulse: 94 96  Resp: 16 17  Temp: 37.6 C 36.5 C  SpO2: 72% 09%    Complications: No apparent anesthesia complications

## 2017-10-23 NOTE — Progress Notes (Signed)
Preoperative Review   Patient is met in the preoperative holding area. The history is reviewed in the chart and with the patient. I personally reviewed the options and rationale as well as the risks of this procedure that have been previously discussed with the patient. All questions asked by the patient and/or family were answered to their satisfaction.  Patient agrees to proceed with this procedure at this time.  Diego Pabon M.D. FACS   

## 2017-10-23 NOTE — Anesthesia Preprocedure Evaluation (Signed)
Anesthesia Evaluation  Patient identified by MRN, date of birth, ID band Patient awake    Reviewed: Allergy & Precautions, H&P , NPO status , Patient's Chart, lab work & pertinent test results  History of Anesthesia Complications Negative for: history of anesthetic complications  Airway Mallampati: III  TM Distance: <3 FB Neck ROM: limited    Dental  (+) Edentulous Lower, Edentulous Upper, Poor Dentition   Pulmonary neg shortness of breath, former smoker,           Cardiovascular Exercise Tolerance: Good hypertension, (-) angina+ CAD, + Past MI, + Cardiac Stents, + Peripheral Vascular Disease and +CHF  (-) DOE + Valvular Problems/Murmurs AS      Neuro/Psych negative neurological ROS  negative psych ROS   GI/Hepatic Neg liver ROS, GERD  Medicated and Controlled,  Endo/Other  diabetes, Type 2Hypothyroidism   Renal/GU CRFRenal disease     Musculoskeletal   Abdominal   Peds  Hematology negative hematology ROS (+)   Anesthesia Other Findings Past Medical History: No date: Anemia No date: Arthritis No date: CAD (coronary artery disease) No date: Chronic kidney disease     Comment:  CKD stage 3 No date: DDD (degenerative disc disease), thoracic No date: Diabetes mellitus without complication (HCC) No date: GERD (gastroesophageal reflux disease) No date: Heart murmur 2014: History of heart artery stent     Comment:  Per patient, [name] [name] put in 2 stents in 2014. 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 No date: PVD (peripheral vascular disease) ([place])  Past Surgical History: No date: BACK SURGERY 05/13/2017: BICEPT TENODESIS; Left     Comment:  Procedure: BICEPS TENODESIS;  Surgeon: [name],               MD;   Location: [facility] ORS;  Service: Orthopedics;                Laterality: Left; No date: CORONARY ANGIOPLASTY No date: JOINT REPLACEMENT; Bilateral     Comment:  knee No date: JOINT REPLACEMENT; Right     Comment:  hip 05/13/2017: SHOULDER ARTHROSCOPY WITH OPEN ROTATOR CUFF REPAIR; Left     Comment:  Procedure: SHOULDER ARTHROSCOPY WITH OPEN ROTATOR CUFF               REPAIR;  Surgeon: [name], MD;  Location: [facility] ORS;              Service: Orthopedics;  Laterality: Left;  subacromial               decompression, debridement No date: TONSILLECTOMY No date: VASECTOMY  BMI    Body Mass Index:  33.79 kg/m      Reproductive/Obstetrics negative OB ROS                             Anesthesia Physical Anesthesia Plan  ASA: III  Anesthesia Plan: General ETT   Post-op Pain Management:    Induction: Intravenous  PONV Risk Score and Plan: Ondansetron, Dexamethasone, Midazolam and Treatment may vary due to age or medical condition  Airway Management Planned: Oral ETT  Additional Equipment:   Intra-op Plan:   Post-operative Plan: Extubation in OR  Informed Consent: I have reviewed the patients History and Physical, chart, labs  and discussed the procedure including the risks, benefits and alternatives for the proposed anesthesia with the patient or authorized representative who has indicated his/her understanding and acceptance.   Dental Advisory Given  Plan Discussed with: Anesthesiologist, CRNA and Surgeon  Anesthesia Plan Comments: (Patient consented for risks of anesthesia including but not limited to:  - adverse reactions to medications - damage to teeth, lips or other oral mucosa - sore throat or hoarseness - Damage to heart, brain, lungs or loss of life  Patient voiced understanding.)        Anesthesia Quick Evaluation

## 2017-10-23 NOTE — Anesthesia Postprocedure Evaluation (Signed)
Anesthesia Post Note  Patient: [name] [name]  Procedure(s) Performed: LAPAROSCOPIC CHOLECYSTECTOMY (N/A ) HERNIA REPAIR UMBILICAL ADULT  Patient location during evaluation: PACU Anesthesia Type: General Level of consciousness: awake and alert Pain management: pain level controlled Vital Signs Assessment: post-procedure vital signs reviewed and stable Respiratory status: spontaneous breathing, nonlabored ventilation, respiratory function stable and patient connected to nasal cannula oxygen Cardiovascular status: blood pressure returned to baseline and stable Postop Assessment: no apparent nausea or vomiting Anesthetic complications: no     Last Vitals:  Vitals:   10/23/17 1220 10/23/17 1416  BP: 121/65 126/67  Pulse: 85 74  Resp:  20  Temp: 36.7 C 36.5 C  SpO2: 94% 97%    Last Pain:  Vitals:   10/23/17 1416  TempSrc: Oral  PainSc:                  Precious Haws Piscitello

## 2017-10-23 NOTE — Care Management Important Message (Signed)
Important Message  Patient Details  Name: [name] [name] MRN: [id number] Date of Birth: 1940/10/03   Medicare Important Message Given:  Yes    Juliann Pulse A Pegeen Stiger 10/23/2017, 2:05 PM

## 2017-10-23 NOTE — Anesthesia Post-op Follow-up Note (Signed)
Anesthesia QCDR form completed.        

## 2017-10-24 ENCOUNTER — Encounter: Payer: Self-pay | Admitting: Surgery

## 2017-10-24 LAB — GLUCOSE, CAPILLARY
GLUCOSE-CAPILLARY: 325 mg/dL — AB (ref 70–99)
Glucose-Capillary: 250 mg/dL — ABNORMAL HIGH (ref 70–99)

## 2017-10-24 LAB — COMPREHENSIVE METABOLIC PANEL
ALT: 44 U/L (ref 0–44)
AST: 43 U/L — ABNORMAL HIGH (ref 15–41)
Albumin: 2.9 g/dL — ABNORMAL LOW (ref 3.5–5.0)
Alkaline Phosphatase: 99 U/L (ref 38–126)
Anion gap: 9 (ref 5–15)
BUN: 31 mg/dL — ABNORMAL HIGH (ref 8–23)
CHLORIDE: 102 mmol/L (ref 98–111)
CO2: 22 mmol/L (ref 22–32)
Calcium: 8.3 mg/dL — ABNORMAL LOW (ref 8.9–10.3)
Creatinine, Ser: 2 mg/dL — ABNORMAL HIGH (ref 0.61–1.24)
GFR, EST AFRICAN AMERICAN: 35 mL/min — AB (ref >60)
GFR, EST NON AFRICAN AMERICAN: 30 mL/min — AB (ref >60)
Glucose, Bld: 324 mg/dL — ABNORMAL HIGH (ref 70–99)
POTASSIUM: 4.4 mmol/L (ref 3.5–5.1)
Sodium: 133 mmol/L — ABNORMAL LOW (ref 135–145)
Total Bilirubin: 0.5 mg/dL (ref 0.3–1.2)
Total Protein: 6.7 g/dL (ref 6.5–8.1)

## 2017-10-24 LAB — CBC
HCT: 33.1 % — ABNORMAL LOW (ref 40.0–52.0)
Hemoglobin: 11.1 g/dL — ABNORMAL LOW (ref 13.0–18.0)
MCH: 31.2 pg (ref 26.0–34.0)
MCHC: 33.5 g/dL (ref 32.0–36.0)
MCV: 93.3 fL (ref 80.0–100.0)
Platelets: 318 10*3/uL (ref 150–440)
RBC: 3.54 MIL/uL — ABNORMAL LOW (ref 4.40–5.90)
RDW: 14.7 % — AB (ref 11.5–14.5)
WBC: 16.7 10*3/uL — ABNORMAL HIGH (ref 3.8–10.6)

## 2017-10-24 MED ORDER — HYDROCODONE-ACETAMINOPHEN 5-325 MG PO TABS: 1.0000 | ORAL_TABLET | Freq: Four times a day (QID) | ORAL | 0 refills | Status: DC | PRN

## 2017-10-24 NOTE — Discharge Summary (Signed)
[facility] NAME: [name] [name]    MR#:  [id number]  DATE OF BIRTH:  Jun 15, 1940  DATE OF ADMISSION:  10/20/2017 ADMITTING PHYSICIAN: Demetrios Loll, MD  DATE OF DISCHARGE: 10/24/2017   PRIMARY CARE PHYSICIAN: Idelle Crouch, MD    ADMISSION DIAGNOSIS:  AKI (acute kidney injury) ([place]) [N17.9] Leukocytosis, unspecified type [D72.829] Acute pancreatitis, unspecified complication status, unspecified pancreatitis type [K85.90]  DISCHARGE DIAGNOSIS:  Active Problems:   Acute pancreatitis   Gallstone pancreatitis   Umbilical hernia without obstruction and without gangrene   SECONDARY DIAGNOSIS:   Past Medical History:  Diagnosis Date  . Anemia   . Arthritis   . CAD (coronary artery disease)   . Chronic kidney disease    CKD stage 3  . DDD (degenerative disc disease), thoracic   . Diabetes mellitus without complication ([place])   . GERD (gastroesophageal reflux disease)   . Heart murmur   . History of heart artery stent 2014   Per patient, [name] put in 2 stents in 2014.  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 Hyperlipidemia   . Hypertension   . Hypothyroidism   . Macular degeneration   . Myocardial infarction [facility]) 1999   Per Pt, MI in 1999.  [name] PVD (peripheral vascular disease) [facility])     HOSPITAL COURSE:   * Acute pancreatitis, due to gall stone  worsening abdominal pain after discharged from ER 2 days ago. N.p.o. except medication, IV fluid support, pain control and GI consult.  on liquid diet. Suspect due to gall stone, his TG is not high. Surgery had done cholecystectomy.  * fever   May be due to pancreatitis   IV zosyn for now, check Bl cx. Negative so far.  * Abnormal liver function test due to above. Follow-up LFT.  * Acute renal failure on CKD stage III. Hold lisinopril and Lasix, IV fluid support and follow-up BMP.  * Leukocytosis. Possible due to reaction.  Follow-up CBC.  * Diabetes. Start sliding scale and continue home Lantus   * Hypertension. Hold lisinopril and Lasix. Start Norvasc.  * Constipation. Colace twice daily and Dulcolax as needed.  * lung nodule- need to have follow up CT in 1 year.  DISCHARGE CONDITIONS:   Stable.  CONSULTS OBTAINED:  Treatment Team:  [name], MD  DRUG ALLERGIES:   Allergies  Allergen Reactions  . Sulfamethoxazole Hives  . Tomato     Breaks out  . Fish Oil Rash    DISCHARGE MEDICATIONS:   Allergies as of 10/24/2017      Reactions   Sulfamethoxazole Hives   Tomato    Breaks out   Fish Oil Rash      Medication List    STOP taking these medications   oxyCODONE 5 MG immediate release tablet Commonly known as:  Oxy IR/ROXICODONE   oxyCODONE-acetaminophen 5-325 MG tablet Commonly known as:  PERCOCET/ROXICET   SOLIQUA 100-33 UNT-MCG/ML Sopn Generic drug:  Insulin Glargine-Lixisenatide     TAKE these medications   aspirin EC 81 MG tablet Take 81 mg by mouth daily.   B-12 2500 MCG Tabs Take 2,500 mcg by mouth daily.   CRESTOR 20 MG tablet Generic drug:  rosuvastatin Take 20 mg by mouth at bedtime.   cyclobenzaprine 10 MG tablet Commonly known as:  FLEXERIL Take 10 mg by mouth 3 (three) times daily as needed for muscle spasms.   furosemide 20  MG tablet Commonly known as:  LASIX Take 20 mg by mouth daily as needed for edema.   gabapentin 300 MG capsule Commonly known as:  NEURONTIN Take 300 mg by mouth at bedtime.   HYDROcodone-acetaminophen 5-325 MG tablet Commonly known as:  NORCO/VICODIN Take 1-2 tablets by mouth every 6 (six) hours as needed for moderate pain or severe pain.   levocetirizine 5 MG tablet Commonly known as:  XYZAL Take 5 mg by mouth every evening.   levothyroxine 50 MCG tablet Commonly known as:  SYNTHROID, LEVOTHROID Take 50 mcg by mouth daily before breakfast.   lisinopril 5 MG tablet Commonly known as:   PRINIVIL,ZESTRIL Take 5 mg by mouth at bedtime.   metFORMIN 500 MG tablet Commonly known as:  GLUCOPHAGE Take 500 mg by mouth 2 (two) times daily.   multivitamin with minerals Tabs tablet Take 1 tablet by mouth daily.   NITROSTAT 0.4 MG SL tablet Generic drug:  nitroGLYCERIN Place 1 tablet under the tongue as needed. For chest pain   nystatin powder Commonly known as:  MYCOSTATIN/NYSTOP Apply 1 g topically daily as needed (jock itch).   omeprazole 40 MG capsule Commonly known as:  PRILOSEC Take 40 mg by mouth daily.   ondansetron 4 MG disintegrating tablet Commonly known as:  ZOFRAN-ODT Take 1 tablet (4 mg total) by mouth every 8 (eight) hours as needed for nausea or vomiting.   PRESERVISION AREDS 2 Caps Take 1 capsule by mouth 2 (two) times daily.   sildenafil 20 MG tablet Commonly known as:  REVATIO Take 40-60 mg by mouth daily as needed (erectile dysfunction).        DISCHARGE INSTRUCTIONS:    Follow with surgery clinic in 1-2 weeks.  If you experience worsening of your admission symptoms, develop shortness of breath, life threatening emergency, suicidal or homicidal thoughts you must seek medical attention immediately by calling 911 or calling your MD immediately  if symptoms less severe.  You Must read complete instructions/literature along with all the possible adverse reactions/side effects for all the Medicines you take and that have been prescribed to you. Take any new Medicines after you have completely understood and accept all the possible adverse reactions/side effects.   Please note  You were cared for by a hospitalist during your hospital stay. If you have any questions about your discharge medications or the care you received while you were in the hospital after you are discharged, you can call the unit and asked to speak with the hospitalist on call if the hospitalist that took care of you is not available. Once you are discharged, your primary care  physician will handle any further medical issues. Please note that NO REFILLS for any discharge medications will be authorized once you are discharged, as it is imperative that you return to your primary care physician (or establish a relationship with a primary care physician if you do not have one) for your aftercare needs so that they can reassess your need for medications and monitor your lab values.    Today   CHIEF COMPLAINT:   Chief Complaint  Patient presents with  . Abnormal Lab    HISTORY OF PRESENT ILLNESS:  Lasean Gorniak  is a 77 y.o. male with a known history of hypertension, diabetes, CKD stage III, CAD, DDD, GERD, hyperlipidemia, hypothyroidism, PVD and anemia.  The patient was diagnosed with acute pancreatitis 3 days ago.  His abdominal pain has been worsening for the past 3 days.  His lipase decreased  from 4000 to 35.  But the CAT scan of the abdomen show increased peripancreatic edema.  Dr. Rip Harbour discussed with GI physician and request admission.   VITAL SIGNS:  Blood pressure 137/85, pulse 79, temperature 98.4 F (36.9 C), temperature source Oral, resp. rate 18, height 6' (1.829 m), weight 113 kg, SpO2 97 %.  I/O:    Intake/Output Summary (Last 24 hours) at 10/24/2017 1219 Last data filed at 10/24/2017 6967 Gross per 24 hour  Intake 1023.54 ml  Output 800 ml  Net 223.54 ml    PHYSICAL EXAMINATION:   GENERAL:  77 y.o.-year-old patient lying in the bed with no acute distress.  EYES: Pupils equal, round, reactive to light and accommodation. No scleral icterus. Extraocular muscles intact.  HEENT: Head atraumatic, normocephalic. Oropharynx and nasopharynx clear.  NECK:  Supple, no jugular venous distention. No thyroid enlargement, no tenderness.  LUNGS: Normal breath sounds bilaterally, no wheezing, rales,rhonchi or crepitation. No use of accessory muscles of respiration.  CARDIOVASCULAR: S1, S2 normal. No murmurs, rubs, or gallops.  ABDOMEN: Soft,mild tender,  nondistended. Bowel sounds present. No organomegaly or mass.  EXTREMITIES: No pedal edema, cyanosis, or clubbing.  NEUROLOGIC: Cranial nerves II through XII are intact. Muscle strength 5/5 in all extremities. Sensation intact. Gait not checked.  PSYCHIATRIC: The patient is alert and oriented x 3.  SKIN: No obvious rash, lesion, or ulcer.    DATA REVIEW:   CBC Recent Labs  Lab 10/24/17 1026  WBC 16.7*  HGB 11.1*  HCT 33.1*  PLT 318    Chemistries  Recent Labs  Lab 10/24/17 1026  NA 133*  K 4.4  CL 102  CO2 22  GLUCOSE 324*  BUN 31*  CREATININE 2.00*  CALCIUM 8.3*  AST 43*  ALT 44  ALKPHOS 99  BILITOT 0.5    Cardiac Enzymes Recent Labs  Lab 10/17/17 2211  TROPONINI <0.03    Microbiology Results  Results for orders placed or performed during the hospital encounter of 10/20/17  CULTURE, BLOOD (ROUTINE X 2) w Reflex to ID Panel     Status: None (Preliminary result)   Collection Time: 10/21/17  9:02 AM  Result Value Ref Range Status   Specimen Description BLOOD  Final   Special Requests NONE  Final   Culture   Final    NO GROWTH 3 DAYS Performed at [facility], [address], [place], [place] [address]    Report Status PENDING  Incomplete  CULTURE, BLOOD (ROUTINE X 2) w Reflex to ID Panel     Status: None (Preliminary result)   Collection Time: 10/21/17  9:15 AM  Result Value Ref Range Status   Specimen Description BLOOD  Final   Special Requests NONE  Final   Culture   Final    NO GROWTH 3 DAYS Performed at [facility], [address], [place], [place] [address]    Report Status PENDING  Incomplete  Surgical pcr screen     Status: None   Collection Time: 10/23/17 12:20 AM  Result Value Ref Range Status   MRSA, PCR NEGATIVE NEGATIVE Final   Staphylococcus aureus NEGATIVE NEGATIVE Final    Comment: (NOTE) The Xpert SA Assay (FDA approved for NASAL specimens in patients 29 years of age and older), is one component of a  comprehensive surveillance program. It is not intended to diagnose infection nor to guide or monitor treatment. Performed at [facility], [address], [place], [place] [address]     RADIOLOGY:  Dg Cholangiogram Operative  Result Date: 10/23/2017 CLINICAL DATA:  77 year old male with cholelithiasis undergoing elective laparoscopic cholecystectomy and intraoperative cholangiogram EXAM: INTRAOPERATIVE CHOLANGIOGRAM TECHNIQUE: Cholangiographic images from the C-arm fluoroscopic device were submitted for interpretation post-operatively. Please see the procedural report for the amount of contrast and the fluoroscopy time utilized. COMPARISON:  CT scan of the abdomen and pelvis 10/20/2017 FINDINGS: A series of 7 intraoperative cine clips are submitted for review. The images demonstrate cannulation of the cystic duct remanent and opacification of the biliary tree. No evidence of biliary ductal dilatation, stenosis, stricture or choledocholithiasis. Contrast material passes freely through the ampulla and into the duodenum. IMPRESSION: Negative intraoperative cholangiogram. Electronically Signed   By: Jacqulynn Cadet M.D.   On: 10/23/2017 15:55    EKG:   Orders placed or performed during the hospital encounter of 10/17/17  . ED EKG within 10 minutes  . ED EKG within 10 minutes  . EKG 12-Lead  . EKG 12-Lead  . EKG      Management plans discussed with the patient, family and they are in agreement.  CODE STATUS:     Code Status Orders  (From admission, onward)         Start     Ordered   10/20/17 2131  Full code  Continuous     10/20/17 2130        Code Status History    Date Active Date Inactive Code Status Order ID Comments User Context   12/08/2015 1707 12/09/2015 1636 Full Code [id number]  Theodoro Grist, MD Inpatient    Advance Directive Documentation     Most Recent Value  Type of Advance Directive  Living will  Pre-existing out of facility DNR order (yellow  form or pink MOST form)  -  "MOST" Form in Place?  -      TOTAL TIME TAKING CARE OF THIS PATIENT: 35 minutes.    Vaughan Basta M.D on 10/24/2017 at 12:19 PM  Between 7am to 6pm - Pager - [phone]  After 6pm go to [email or web address] - password [email or web address] [place]  Office  [phone]  CC: Primary care physician; Idelle Crouch, MD   Note: This dictation was prepared with Dragon dictation along with smaller phrase technology. Any transcriptional errors that result from this process are unintentional.

## 2017-10-24 NOTE — Plan of Care (Signed)

## 2017-10-24 NOTE — Progress Notes (Signed)
[facility] Progress Note  1 Day Post-Op  Subjective: POD#1 s/p laparoscopic cholecystectomy. He denied any abdominal pain, nausea, or emesis. He has tolerated a diet well without difficulty.   Objective: Vital signs in last 24 hours: Temp:  [97.7 F (36.5 C)-98.4 F (36.9 C)] 98.4 F (36.9 C) (08/23 0931) Pulse Rate:  [72-96] 79 (08/23 0931) Resp:  [14-20] 18 (08/23 0931) BP: (97-169)/(55-85) 137/85 (08/23 0931) SpO2:  [90 %-100 %] 97 % (08/23 0931) Last BM Date: 10/21/17  Intake/Output from previous day: 08/22 0701 - 08/23 0700 In: 1823.5 [P.O.:200; I.V.:1480.6; IV Piggyback:143] Out: 800 [Urine:800] Intake/Output this shift: No intake/output data recorded.  PE: Gen:  Alert, NAD, pleasant Abd: Soft, non-tender, non-distended, 4 laparoscopic incisions C/D/I Skin: warm and dry, no rashes  Psych: A&Ox3   Lab Results:  Recent Labs    10/22/17 0326  WBC 11.8*  HGB 10.6*  HCT 30.9*  PLT 209   BMET Recent Labs    10/22/17 0326  NA 137  K 3.8  CL 107  CO2 23  GLUCOSE 154*  BUN 23  CREATININE 1.97*  CALCIUM 8.1*   PT/INR No results for input(s): LABPROT, INR in the last 72 hours. CMP     Component Value Date/Time   NA 137 10/22/2017 0326   NA 136 02/18/2013 0441   K 3.8 10/22/2017 0326   K 4.3 02/18/2013 0441   CL 107 10/22/2017 0326   CL 105 02/18/2013 0441   CO2 23 10/22/2017 0326   CO2 27 02/18/2013 0441   GLUCOSE 154 (H) 10/22/2017 0326   GLUCOSE 130 (H) 02/18/2013 0441   BUN 23 10/22/2017 0326   BUN 15 02/18/2013 0441   CREATININE 1.97 (H) 10/22/2017 0326   CREATININE 1.13 02/18/2013 0441   CALCIUM 8.1 (L) 10/22/2017 0326   CALCIUM 9.4 02/18/2013 0441   PROT 6.3 (L) 10/22/2017 0326   ALBUMIN 2.9 (L) 10/22/2017 0326   AST 47 (H) 10/22/2017 0326   ALT 39 10/22/2017 0326   ALKPHOS 68 10/22/2017 0326   BILITOT 0.9 10/22/2017 0326   GFRNONAA 31 (L) 10/22/2017 0326   GFRNONAA >60 02/18/2013 0441   GFRAA 36 (L) 10/22/2017 0326    GFRAA >60 02/18/2013 0441   Lipase     Component Value Date/Time   LIPASE 23 10/22/2017 0326       Studies/Results: Dg Cholangiogram Operative  Result Date: 10/23/2017 CLINICAL DATA:  77 year old male with cholelithiasis undergoing elective laparoscopic cholecystectomy and intraoperative cholangiogram EXAM: INTRAOPERATIVE CHOLANGIOGRAM TECHNIQUE: Cholangiographic images from the C-arm fluoroscopic device were submitted for interpretation post-operatively. Please see the procedural report for the amount of contrast and the fluoroscopy time utilized. COMPARISON:  CT scan of the abdomen and pelvis 10/20/2017 FINDINGS: A series of 7 intraoperative cine clips are submitted for review. The images demonstrate cannulation of the cystic duct remanent and opacification of the biliary tree. No evidence of biliary ductal dilatation, stenosis, stricture or choledocholithiasis. Contrast material passes freely through the ampulla and into the duodenum. IMPRESSION: Negative intraoperative cholangiogram. Electronically Signed   By: Jacqulynn Cadet M.D.   On: 10/23/2017 15:55    Anti-infectives: Anti-infectives (From admission, onward)   Start     Dose/Rate Route Frequency Ordered Stop   10/21/17 1000  piperacillin-tazobactam (ZOSYN) IVPB 3.375 g     3.375 g 12.5 mL/hr over 240 Minutes Intravenous Every 8 hours 10/21/17 0903         Assessment/Plan   Gallstone Pancreatitis - POD 1, he is doing really  well this morning. Incisions are intact without erythema or purulence. He denied any pain this morning and has tolerated a diet.  - At this time, general surgery will sign off and will follow him as an outpatient in 3 weeks.    LOS: 4 days    Edison Simon , PA-C [facility] 10/24/2017, 9:56 AM

## 2017-10-24 NOTE — Progress Notes (Signed)
Patient discharged home with family. Discharge instruction given to patient/family, all questions answered and concerns addressed. Patient/family verbalized understanding.

## 2017-10-24 NOTE — Progress Notes (Addendum)
Inpatient Diabetes Program Recommendations  AACE/ADA: New Consensus Statement on Inpatient Glycemic Control (2015)  Target Ranges:  Prepandial:   less than 140 mg/dL      Peak postprandial:   less than 180 mg/dL (1-2 hours)      Critically ill patients:  140 - 180 mg/dL   Results for [name] [name], [name] [name] (MRN [id number]) as of 10/24/2017 10:20  Ref. Range 10/23/2017 08:22 10/23/2017 10:53 10/23/2017 16:40 10/23/2017 21:09  Glucose-Capillary Latest Ref Range: 70 - 99 mg/dL 139 (H)   5 mg Decadron at 9am 146 (H) 213 (H) 278 (H)   Results for [name] [name], [name] [name] (MRN [id number]) as of 10/24/2017 10:20  Ref. Range 10/24/2017 07:55  Glucose-Capillary Latest Ref Range: 70 - 99 mg/dL 250 (H)    Admit: Pancreatitis/ Gallstones  History: DM  Home DM Meds: Metformin 500 mg BID                  Soliqua 46 units daily  Current Insulin Orders: None yet      MD- If patient not discharged home today, please consider the following:   1. Start Lantus 23 units daily (pt takes Soliqua 46 units daily at home which contains 46 units Insulin glargine)  This would be about 50% total home dose of insulin pt gets in his Soliqua dose   2. Start Novolog Moderate Correction Scale/ SSI (0-15 units) TID AC + HS      --Will follow patient during hospitalization--  Wyn Quaker RN, MSN, CDE Diabetes Coordinator Inpatient Glycemic Control Team Team Pager: [phone] (8a-5p)

## 2017-10-24 NOTE — Progress Notes (Signed)
[facility] NAME: [name] [name]    MR#:  [id number]  DATE OF BIRTH:  Dec 01, 1940  SUBJECTIVE:  CHIEF COMPLAINT:   Chief Complaint  Patient presents with  . Abnormal Lab   Came with pancreatitis, not much pain now. Seen post surgery- ordered diet by surgery.  REVIEW OF SYSTEMS:  CONSTITUTIONAL: No fever, fatigue or weakness.  EYES: No blurred or double vision.  EARS, NOSE, AND THROAT: No tinnitus or ear pain.  RESPIRATORY: No cough, shortness of breath, wheezing or hemoptysis.  CARDIOVASCULAR: No chest pain, orthopnea, edema.  GASTROINTESTINAL: No nausea, vomiting, diarrhea , had abdominal pain.  GENITOURINARY: No dysuria, hematuria.  ENDOCRINE: No polyuria, nocturia,  HEMATOLOGY: No anemia, easy bruising or bleeding SKIN: No rash or lesion. MUSCULOSKELETAL: No joint pain or arthritis.   NEUROLOGIC: No tingling, numbness, weakness.  PSYCHIATRY: No anxiety or depression.   Review of Systems  Constitutional: Positive for chills.    DRUG ALLERGIES:   Allergies  Allergen Reactions  . Sulfamethoxazole Hives  . Tomato     Breaks out  . Fish Oil Rash    VITALS:  Blood pressure (!) 169/68, pulse 86, temperature 97.8 F (36.6 C), temperature source Oral, resp. rate 18, height 6' (1.829 m), weight 113 kg, SpO2 100 %.  PHYSICAL EXAMINATION:  GENERAL:  77 y.o.-year-old patient lying in the bed with no acute distress.  EYES: Pupils equal, round, reactive to light and accommodation. No scleral icterus. Extraocular muscles intact.  HEENT: Head atraumatic, normocephalic. Oropharynx and nasopharynx clear.  NECK:  Supple, no jugular venous distention. No thyroid enlargement, no tenderness.  LUNGS: Normal breath sounds bilaterally, no wheezing, rales,rhonchi or crepitation. No use of accessory muscles of respiration.  CARDIOVASCULAR: S1, S2 normal. No murmurs, rubs, or gallops.  ABDOMEN: Soft,mild tender, nondistended. Bowel sounds  present. No organomegaly or mass.  EXTREMITIES: No pedal edema, cyanosis, or clubbing.  NEUROLOGIC: Cranial nerves II through XII are intact. Muscle strength 5/5 in all extremities. Sensation intact. Gait not checked.  PSYCHIATRIC: The patient is alert and oriented x 3.  SKIN: No obvious rash, lesion, or ulcer.   Physical Exam LABORATORY PANEL:   CBC Recent Labs  Lab 10/22/17 0326  WBC 11.8*  HGB 10.6*  HCT 30.9*  PLT 209   ------------------------------------------------------------------------------------------------------------------  Chemistries  Recent Labs  Lab 10/22/17 0326  NA 137  K 3.8  CL 107  CO2 23  GLUCOSE 154*  BUN 23  CREATININE 1.97*  CALCIUM 8.1*  AST 47*  ALT 39  ALKPHOS 68  BILITOT 0.9   ------------------------------------------------------------------------------------------------------------------  Cardiac Enzymes Recent Labs  Lab 10/17/17 1920 10/17/17 2211  TROPONINI <0.03 <0.03   ------------------------------------------------------------------------------------------------------------------  RADIOLOGY:  Dg Cholangiogram Operative  Result Date: 10/23/2017 CLINICAL DATA:  77 year old male with cholelithiasis undergoing elective laparoscopic cholecystectomy and intraoperative cholangiogram EXAM: INTRAOPERATIVE CHOLANGIOGRAM TECHNIQUE: Cholangiographic images from the C-arm fluoroscopic device were submitted for interpretation post-operatively. Please see the procedural report for the amount of contrast and the fluoroscopy time utilized. COMPARISON:  CT scan of the abdomen and pelvis 10/20/2017 FINDINGS: A series of 7 intraoperative cine clips are submitted for review. The images demonstrate cannulation of the cystic duct remanent and opacification of the biliary tree. No evidence of biliary ductal dilatation, stenosis, stricture or choledocholithiasis. Contrast material passes freely through the ampulla and into the duodenum. IMPRESSION:  Negative intraoperative cholangiogram. Electronically Signed   By: Jacqulynn Cadet M.D.   On: 10/23/2017 15:55  ASSESSMENT AND PLAN:   Active Problems:   Acute pancreatitis   Gallstone pancreatitis   Umbilical hernia without obstruction and without gangrene  * Acute pancreatitis, due to gall stone  worsening abdominal pain after discharged from ER 2 days ago. N.p.o. except medication, IV fluid support, pain control and GI consult.  on liquid diet. Suspect due to gall stone, his TG is not high. Surgery had done cholecystectomy.  * fever   May be due to pancreatitis   IV zosyn for now, check Bl cx. Negative so far.  * Abnormal liver function test due to above.  Follow-up LFT.  * Acute renal failure on CKD stage III.  Hold lisinopril and Lasix, IV fluid support and follow-up BMP.  * Leukocytosis.  Possible due to reaction.  Follow-up CBC.  * Diabetes.  Start sliding scale and continue home Lantus   * Hypertension.  Hold lisinopril and Lasix.  Start Norvasc.  * Constipation.  Colace twice daily and Dulcolax as needed.  * lung nodule- need to have follow up CT in 1 year.  All the records are reviewed and case discussed with Care Management/Social Workerr. Management plans discussed with the patient, family and they are in agreement.  CODE STATUS: Full.  TOTAL TIME TAKING CARE OF THIS PATIENT: 35 minutes.    POSSIBLE D/C IN 1-2 DAYS, DEPENDING ON CLINICAL CONDITION.   Vaughan Basta M.D on 10/24/2017   Between 7am to 6pm - Pager - [phone]  After 6pm go to [email or web address] - password [email or web address] [facility]  Office  [phone]  CC: Primary care physician; Idelle Crouch, MD  Note: This dictation was prepared with Dragon dictation along with smaller phrase technology. Any transcriptional errors that result from this process are unintentional.

## 2017-10-25 LAB — SURGICAL PATHOLOGY

## 2017-10-26 LAB — CULTURE, BLOOD (ROUTINE X 2)
CULTURE: NO GROWTH
Culture: NO GROWTH

## 2017-10-30 ENCOUNTER — Telehealth: Payer: Self-pay | Admitting: Surgery

## 2017-10-30 NOTE — Telephone Encounter (Signed)
[name] [name] called stating patient had GB surgery on 10-23-17 by Dr Dahlia Byes & he has had diarrhea almost every day. Is this normal? Please advise by calling [phone].

## 2017-10-31 NOTE — Telephone Encounter (Signed)
Advised the patient to take imodium over the counter. The patient is aware to call back for any questions or concerns.

## 2017-10-31 NOTE — Telephone Encounter (Signed)
Will advised the patient to take imodium over the counter.

## 2017-11-12 ENCOUNTER — Encounter: Payer: Self-pay | Admitting: Surgery

## 2017-11-12 ENCOUNTER — Ambulatory Visit (INDEPENDENT_AMBULATORY_CARE_PROVIDER_SITE_OTHER): Payer: Medicare Other | Admitting: Surgery

## 2017-11-12 VITALS — BP 102/64 | HR 78 | Temp 97.2°F | Resp 14 | Ht 72.0 in | Wt 234.0 lb

## 2017-11-12 DIAGNOSIS — Z09 Encounter for follow-up examination after completed treatment for conditions other than malignant neoplasm: Secondary | ICD-10-CM | POA: Diagnosis not present

## 2017-11-12 NOTE — Progress Notes (Signed)
S/p lap chole GS pancreatitis Path d/w pt Doing very well Transient diarrhea No complaints today + PO, no fevers  PE NAD Abd: soft, nt, incisions c./d/i, no infection  A/P Doing well No complications RTC prn

## 2017-11-12 NOTE — Patient Instructions (Addendum)
The patient is aware to call back for any questions or new concerns. Resume activities as tolerated. No heavy lifting for 3 more weeks. Imodium as needed for diarrhea

## 2017-11-14 ENCOUNTER — Ambulatory Visit (INDEPENDENT_AMBULATORY_CARE_PROVIDER_SITE_OTHER): Payer: Medicare Other | Admitting: Vascular Surgery

## 2017-11-14 ENCOUNTER — Encounter (INDEPENDENT_AMBULATORY_CARE_PROVIDER_SITE_OTHER): Payer: Self-pay | Admitting: Vascular Surgery

## 2017-11-14 VITALS — BP 125/74 | HR 64 | Resp 16 | Ht 74.0 in | Wt 232.4 lb

## 2017-11-14 DIAGNOSIS — I700 Atherosclerosis of aorta: Secondary | ICD-10-CM

## 2017-11-14 DIAGNOSIS — I10 Essential (primary) hypertension: Secondary | ICD-10-CM | POA: Insufficient documentation

## 2017-11-14 DIAGNOSIS — E785 Hyperlipidemia, unspecified: Secondary | ICD-10-CM

## 2017-11-14 DIAGNOSIS — I712 Thoracic aortic aneurysm, without rupture, unspecified: Secondary | ICD-10-CM

## 2017-11-14 NOTE — Assessment & Plan Note (Signed)
With mild ectasia.  Recheck in 1 year.

## 2017-11-14 NOTE — Assessment & Plan Note (Signed)
blood pressure control important in reducing the progression of atherosclerotic disease and aneurysmal growth. On appropriate oral medications.  

## 2017-11-14 NOTE — Progress Notes (Signed)
Patient ID: [name] [name], male   DOB: [birth year], 77 y.o.   MRN: [id number]  Chief Complaint  Patient presents with  . Follow-up    pt here for AAA follow up    HPI [name] [name] is a 77 y.o. male.  I am asked to see the patient by Dr. Doy Hutching for evaluation of aortic aneurysmal disease.  The patient reports having me see him about 6 or 7 years ago for some peripheral vascular disease that has not been symptomatic or problematic for him since that time.  Recently, he had issues with pancreatitis and gallstones.  He had a prolonged course and ultimately had a cholecystectomy and has done well from this.  As part of his work-up with this he underwent CT scans of the chest abdomen and pelvis which I have independently reviewed.  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  He had some infrarenal abdominal aortic atherosclerosis and iliac artery atherosclerosis with ectasia of the infrarenal abdominal aorta and right common iliac artery but no frank aneurysms.  Now that he is recovered from his gallbladder surgery he is referred for further evaluation and treatment.  He does not have any symptoms of his aneurysm.  No signs of peripheral embolization.  No back pain or new abdominal pain after his pancreatitis and gallstone issues.   Past Medical History:  Diagnosis Date  . Anemia   . Arthritis   . CAD (coronary artery disease)   . Chronic kidney disease    CKD stage 3  . DDD (degenerative disc disease), thoracic   . Diabetes mellitus without complication ([place])   . GERD (gastroesophageal reflux disease)   . Heart murmur   . History of heart artery stent 2014   Per patient, [name] [name] put in 2 stents in 2014.  [name] [name] History of MRSA infection 02/2013   groin, after PTCA  . Hyperlipidemia   . Hypertension   . Hypothyroidism   . Macular degeneration   . Myocardial infarction [name]) 1999   Per Pt, MI in 1999.  [name] [name] PVD (peripheral vascular disease) ([place])     Past Surgical  History:  Procedure Laterality Date  . BACK SURGERY    . BICEPT TENODESIS Left 05/13/2017   Procedure: BICEPS TENODESIS;  Surgeon: [name], MD;  Location: [facility] ORS;  Service: Orthopedics;  Laterality: Left;  . CHOLECYSTECTOMY N/A 10/23/2017   Procedure: LAPAROSCOPIC CHOLECYSTECTOMY;  Surgeon: [name], MD;  Location: [facility] ORS;  Service: General;  Laterality: N/A;  . CORONARY ANGIOPLASTY    . JOINT REPLACEMENT Bilateral    knee  . JOINT REPLACEMENT Right    hip  . SHOULDER ARTHROSCOPY WITH OPEN ROTATOR CUFF REPAIR Left 05/13/2017   Procedure: SHOULDER ARTHROSCOPY WITH OPEN ROTATOR CUFF REPAIR;  Surgeon: [name], MD;  Location: [facility] ORS;  Service: Orthopedics;  Laterality: Left;  subacromial decompression, debridement  . TONSILLECTOMY    . UMBILICAL HERNIA REPAIR  10/23/2017   Procedure: HERNIA REPAIR UMBILICAL ADULT;  Surgeon: [name], MD;  Location: [facility] ORS;  Service: General;;  . VASECTOMY      Family History  Problem Relation Age of Onset  . Stroke Father   . Diabetes Paternal Grandmother   No bleeding disorders or clotting disorders  Social History Social History   Tobacco Use  . Smoking status: Former Smoker    Types: Cigarettes    Last attempt to quit: 01/04/1998    Years since quitting: 19.8  . Smokeless  tobacco: Never Used  Substance Use Topics  . Alcohol use: Yes    Alcohol/week: 12.0 - 14.0 standard drinks    Types: 6 - 7 Cans of beer, 6 - 7 Standard drinks or equivalent per week  . Drug use: No    Allergies  Allergen Reactions  . Sulfamethoxazole Hives  . Tomato     Breaks out  . Fish Oil Rash    Current Outpatient Medications  Medication Sig Dispense Refill  . aspirin EC 81 MG tablet Take 81 mg by mouth daily.     . Cyanocobalamin (B-12) 2500 MCG TABS Take 2,500 mcg by mouth daily.    . cyclobenzaprine (FLEXERIL) 10 MG tablet Take 10 mg by mouth 3 (three) times daily as needed for muscle spasms.     . furosemide (LASIX) 20 MG  tablet Take 20 mg by mouth daily as needed for edema.    . gabapentin (NEURONTIN) 300 MG capsule Take 300 mg by mouth at bedtime.     . Insulin Glargine-Lixisenatide (SOLIQUA [place]) Inject into the skin daily.    [name] [name] levocetirizine (XYZAL) 5 MG tablet Take 5 mg by mouth every evening.    [name] [name] levothyroxine (SYNTHROID, LEVOTHROID) 50 MCG tablet Take 50 mcg by mouth daily before breakfast.    . loperamide (IMODIUM) 2 MG capsule Take 2 mg by mouth as needed for diarrhea or loose stools.    . metFORMIN (GLUCOPHAGE) 500 MG tablet Take 500 mg by mouth 2 (two) times daily.     . Multiple Vitamin (MULTIVITAMIN WITH MINERALS) TABS tablet Take 1 tablet by mouth daily.    . Multiple Vitamins-Minerals (PRESERVISION AREDS 2) CAPS Take 1 capsule by mouth 2 (two) times daily.    . nitroGLYCERIN (NITROSTAT) 0.4 MG SL tablet Place 1 tablet under the tongue as needed. For chest pain    . nystatin (MYCOSTATIN/NYSTOP) powder Apply 1 g topically daily as needed (jock itch).    [name] [name] omeprazole (PRILOSEC) 40 MG capsule Take 40 mg by mouth daily.     . rosuvastatin (CRESTOR) 20 MG tablet Take 20 mg by mouth at bedtime.     . sildenafil (REVATIO) 20 MG tablet Take 40-60 mg by mouth daily as needed (erectile dysfunction).    [name] [name] lisinopril (PRINIVIL,ZESTRIL) 5 MG tablet Take 5 mg by mouth at bedtime.      No current facility-administered medications for this visit.       REVIEW OF SYSTEMS (Negative unless checked)  Constitutional: []Weight loss  []Fever  []Chills Cardiac: []Chest pain   []Chest pressure   []Palpitations   []Shortness of breath when laying flat   []Shortness of breath at rest   []Shortness of breath with exertion. Vascular:  []Pain in legs with walking   []Pain in legs at rest   []Pain in legs when laying flat   []Claudication   []Pain in feet when walking  []Pain in feet at rest  []Pain in feet when laying flat   []History of DVT   []Phlebitis   []Swelling in legs   []Varicose veins   []Non-healing  ulcers Pulmonary:   []Uses home oxygen   []Productive cough   []Hemoptysis   []Wheeze  []COPD   []Asthma Neurologic:  []Dizziness  []Blackouts   []Seizures   []History of stroke   []History of TIA  []Aphasia   []Temporary blindness   []Dysphagia   []Weakness or numbness in arms   []Weakness or numbness in legs Musculoskeletal:  [x]Arthritis   []Joint swelling   []  Joint pain   []Low back pain Hematologic:  []Easy bruising  []Easy bleeding   []Hypercoagulable state   [x]Anemic  []Hepatitis Gastrointestinal:  []Blood in stool   []Vomiting blood  [x]Gastroesophageal reflux/heartburn   [x]Abdominal pain Genitourinary:  []Chronic kidney disease   []Difficult urination  []Frequent urination  []Burning with urination   []Hematuria Skin:  []Rashes   []Ulcers   []Wounds Psychological:  []History of anxiety   [] History of major depression.    Physical Exam BP 125/74 (BP Location: Right Arm)   Pulse 64   Resp 16   Ht 6' 2" (1.88 m)   Wt 232 lb 6.4 oz (105.4 kg)   BMI 29.84 kg/m  Gen:  WD/WN, NAD. Appears younger than stated age Head: [place]/AT, No temporalis wasting.  Ear/Nose/Throat: Hearing grossly intact, nares w/o erythema or drainage, oropharynx w/o Erythema/Exudate Eyes: Conjunctiva clear, sclera non-icteric  Neck: trachea midline.  No bruit or JVD.  Pulmonary:  Good air movement, clear to auscultation bilaterally.  Cardiac: RRR, normal S1, S2 Vascular:  Vessel Right Left  Radial Palpable Palpable                                   Gastrointestinal: soft, non-tender/non-distended. No increased aortic impulse Musculoskeletal: M/S 5/5 throughout.  Extremities without ischemic changes.  No deformity or atrophy. No edema. Neurologic: Sensation grossly intact in extremities.  Symmetrical.  Speech is fluent. Motor exam as listed above. Psychiatric: Judgment intact, Mood & affect appropriate for pt's clinical situation. Dermatologic: No rashes or ulcers noted.  No cellulitis or open  wounds.    Radiology Ct Abdomen Pelvis Wo Contrast  Result Date: 10/20/2017 CLINICAL DATA:  Upper abdominal and lower chest pain for 3 days with diaphoresis, fatigue, and low-grade fever, recently seen for pancreatitis, history of coronary artery disease post MI and coronary PTCA, stage III chronic kidney disease, hypertension, diabetes mellitus, former smoker EXAM: CT ABDOMEN AND PELVIS WITHOUT CONTRAST TECHNIQUE: Multidetector CT imaging of the abdomen and pelvis was performed following the standard protocol without IV contrast. Sagittal and coronal MPR images reconstructed from axial data set. COMPARISON:  10/17/2017 FINDINGS: Lower chest: Chronic interstitial changes at lung bases greater on RIGHT. Hepatobiliary: Dependent high attenuation gallbladder likely layering tiny gallstones. No gross gallbladder wall thickening by CT. Liver unremarkable. Pancreas: Peripancreatic edema again identified pancreatic head and proximal body consistent with acute pancreatitis. Distal pancreatic body and pancreatic tail normal appearance. Peripancreatic infiltrative changes appear increased since previous exam and now extend into the small bowel mesentery. No discrete fluid collection/abscess. Unable to assess for presence of pancreatic necrosis or patency of portal/superior mesenteric/splenic veins due the lack of contrast. Spleen: Normal appearance Adrenals/Urinary Tract: Adrenal glands normal appearance. Persistent perinephric stranding, unchanged. No gross evidence of renal mass or hydronephrosis. Bladder and ureters unremarkable. Stomach/Bowel: Normal appendix. Sigmoid diverticulosis without evidence of diverticulitis. Wall thickening of the third portion of the duodenum secondary to adjacent pancreatitis. Stomach and remaining bowel loops normal appearance. Vascular/Lymphatic: Atherosclerotic calcifications aorta and iliac arteries. Aorta normal caliber. No adenopathy. Reproductive: Mild prostatic enlargement.  Other: No free air or free fluid.  No hernia. Musculoskeletal: No acute osseous findings. IMPRESSION: Again identified changes of acute pancreatitis involving the head and body of the pancreas, with increased peripancreatic edema since the previous exam now extending into the small bowel mesentery. Associated wall thickening of the third portion of the duodenum without obstruction. Dependent cholelithiasis within gallbladder.  Sigmoid diverticulosis without evidence of diverticulitis. Aortic Atherosclerosis (ICD10-I70.0). Electronically Signed   By: Lavonia Dana M.D.   On: 10/20/2017 19:19   Dg Chest 2 View  Result Date: 10/17/2017 CLINICAL DATA:  Chest pain EXAM: CHEST - 2 VIEW COMPARISON:  01/16/2017 FINDINGS: Streaky atelectasis at the left base. No focal consolidation. Normal heart size. No pneumothorax. IMPRESSION: No active cardiopulmonary disease. Streaky atelectasis at the left base Electronically Signed   By: Donavan Foil M.D.   On: 10/17/2017 19:51   Dg Cholangiogram Operative  Result Date: 10/23/2017 CLINICAL DATA:  77 year old male with cholelithiasis undergoing elective laparoscopic cholecystectomy and intraoperative cholangiogram EXAM: INTRAOPERATIVE CHOLANGIOGRAM TECHNIQUE: Cholangiographic images from the C-arm fluoroscopic device were submitted for interpretation post-operatively. Please see the procedural report for the amount of contrast and the fluoroscopy time utilized. COMPARISON:  CT scan of the abdomen and pelvis 10/20/2017 FINDINGS: A series of 7 intraoperative cine clips are submitted for review. The images demonstrate cannulation of the cystic duct remanent and opacification of the biliary tree. No evidence of biliary ductal dilatation, stenosis, stricture or choledocholithiasis. Contrast material passes freely through the ampulla and into the duodenum. IMPRESSION: Negative intraoperative cholangiogram. Electronically Signed   By: Jacqulynn Cadet M.D.   On: 10/23/2017 15:55   Ct  Chest W Contrast  Result Date: 10/17/2017 CLINICAL DATA:  Initial evaluation for acute chest pain and abdominal pain, shortness of breath. EXAM: CT CHEST, ABDOMEN, AND PELVIS WITH CONTRAST TECHNIQUE: Multidetector CT imaging of the chest, abdomen and pelvis was performed following the standard protocol during bolus administration of intravenous contrast. CONTRAST:  136m ISOVUE-300 IOPAMIDOL (ISOVUE-300) INJECTION 61% COMPARISON:  Prior ultrasound from earlier the same day. FINDINGS: CT CHEST FINDINGS Cardiovascular: Ascending aorta dilated up to 4.6 cm in diameter. Intrathoracic aorta otherwise within normal limits without acute abnormality. Moderate aortic atherosclerosis. Focal irregular plaque protrudes into the aortic lumen at the undersurface of the aortic arch. Visualized great vessels within normal limits. Mild cardiomegaly. Diffuse 3 vessel coronary artery calcifications. Thinning at the left ventricular apex with associated fatty degeneration, consistent with prior subendocardial MI (series 2, image 42). No pericardial effusion. Limited evaluation of the pulmonary arterial tree unremarkable. Mediastinum/Nodes: Thyroid normal. No enlarged mediastinal lymph nodes identified. Mildly enlarged 12 mm left hilar node (series 2, image 38). 11 mm right hilar node (series 2, image 35). No axillary adenopathy. Normal esophagus. Lungs/Pleura: Tracheobronchial tree intact and patent. Lungs are well inflated bilaterally. Scattered predominantly subpleural reticular densities noted involving the bilateral lungs in a basilar predominant fashion, most like related to underlying mild fibrotic lung disease. Paraseptal emphysematous changes present at the upper lobes bilaterally. No focal infiltrates or pulmonary edema. No pleural effusion. No pneumothorax. Few subcentimeter calcified granulomas noted. 7 mm pleural base nodule at the left lower lobe (series 4, image 100), indeterminate. No other worrisome pulmonary nodule  or mass. Musculoskeletal: External soft tissues demonstrate no acute finding. No acute osseous abnormality. No worrisome lytic or blastic osseous lesions. CT ABDOMEN PELVIS FINDINGS Hepatobiliary: Liver demonstrates a normal contrast enhanced appearance. Layering hyperdensity within the gallbladder lumen most likely reflects sludge as seen on prior ultrasound. Gallbladder mildly distended with trace pericholecystic free fluid. No biliary dilatation. Pancreas: Diffuse inflammatory stranding seen about the pancreas, most notable at the pancreatic head and uncinate process, suggesting acute pancreatitis. No loculated peripancreatic collections or evidence for pancreatic necrosis. Spleen: Spleen within normal limits. Adrenals/Urinary Tract: Adrenal glands are normal. Kidneys equal in size with symmetric enhancement. No nephrolithiasis, hydronephrosis, or focal  enhancing renal mass. No hydroureter. Bladder grossly unremarkable, although evaluation limited by streak artifact from right hip arthroplasty. Stomach/Bowel: Stomach moderately distended without acute abnormality. Inflammatory stranding surrounds the duodenum related to the acute inflammatory process within the adjacent pancreas. No evidence for bowel obstruction. Normal appendix. Colonic diverticulosis without evidence for acute diverticulitis. No other acute inflammatory changes seen about the bowels. Vascular/Lymphatic: Normal intravascular enhancement seen throughout the intra-abdominal aorta. Infrarenal aorta ectatic measuring up to 2.7 cm. Moderate aortic atherosclerosis. Mesenteric vessels patent proximally. No evidence for venous thrombosis related to the acute pancreatitis. No adenopathy. Reproductive: Normal prostate. Other: No free air or fluid. Musculoskeletal: Right hip arthroplasty in place. No acute osseous abnormality. No worrisome lytic or blastic osseous lesions. IMPRESSION: CT CHEST IMPRESSION 1. No acute abnormality within the chest. 2.  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 3. Myocardial thinning with fatty degeneration at the left ventricular apex, consistent with previous myocardial infarction. 4. 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 Recommend semi-annual imaging followup by CTA or MRA and referral to cardiothoracic surgery if not already obtained. 5. **An incidental finding of potential clinical significance has been found. 7 mm left lower lobe pulmonary nodule, indeterminate. Non-contrast chest CT at 6-12 months is recommended. If the nodule is stable at time of repeat CT, then future CT at 18-24 months (from today's scan) is considered optional for low-risk patients, but is recommended for high-risk patients. This recommendation follows the consensus statement: Guidelines for Management of Incidental Pulmonary Nodules Detected on CT Images: From the Fleischner Society 2017; Radiology 2017; 284:228-243.** CT ABDOMEN AND PELVIS IMPRESSION 1. Acute inflammatory changes surrounding the pancreas, consistent with acute pancreatitis. Correlation with serum lipase recommended. No loculated peripancreatic collections or other complication identified. 2. Mildly distended gallbladder with internal sludge with trace free pericholecystic fluid. Findings felt to likely be secondary in nature due to the acute pancreatic inflammation. 3. Colonic diverticulosis without evidence for acute diverticulitis. 4. 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 Recommend followup by ultrasound in 5 years. This recommendation follows ACR consensus guidelines: White Paper of the ACR Incidental Findings Committee II on Vascular Findings. J Am Coll Radiol 2013; 10:789-794. Electronically Signed   By: Jeannine Boga M.D.   On: 10/17/2017 22:37   Ct Abdomen Pelvis W Contrast  Result Date:  10/17/2017 CLINICAL DATA:  Initial evaluation for acute chest pain and abdominal pain, shortness of breath. EXAM: CT CHEST, ABDOMEN, AND PELVIS WITH CONTRAST TECHNIQUE: Multidetector CT imaging of the chest, abdomen and pelvis was performed following the standard protocol during bolus administration of intravenous contrast. CONTRAST:  160m ISOVUE-300 IOPAMIDOL (ISOVUE-300) INJECTION 61% COMPARISON:  Prior ultrasound from earlier the same day. FINDINGS: CT CHEST FINDINGS Cardiovascular: Ascending aorta dilated up to 4.6 cm in diameter. Intrathoracic aorta otherwise within normal limits without acute abnormality. Moderate aortic atherosclerosis. Focal irregular plaque protrudes into the aortic lumen at the undersurface of the aortic arch. Visualized great vessels within normal limits. Mild cardiomegaly. Diffuse 3 vessel coronary artery calcifications. Thinning at the left ventricular apex with associated fatty degeneration, consistent with prior subendocardial MI (series 2, image 42). No pericardial effusion. Limited evaluation of the pulmonary arterial tree unremarkable. Mediastinum/Nodes: Thyroid normal. No enlarged mediastinal lymph nodes identified. Mildly enlarged 12 mm left hilar node (series 2, image 38). 11 mm right hilar node (series 2, image 35). No axillary adenopathy. Normal esophagus. Lungs/Pleura: Tracheobronchial  tree intact and patent. Lungs are well inflated bilaterally. Scattered predominantly subpleural reticular densities noted involving the bilateral lungs in a basilar predominant fashion, most like related to underlying mild fibrotic lung disease. Paraseptal emphysematous changes present at the upper lobes bilaterally. No focal infiltrates or pulmonary edema. No pleural effusion. No pneumothorax. Few subcentimeter calcified granulomas noted. 7 mm pleural base nodule at the left lower lobe (series 4, image 100), indeterminate. No other worrisome pulmonary nodule or mass. Musculoskeletal: External  soft tissues demonstrate no acute finding. No acute osseous abnormality. No worrisome lytic or blastic osseous lesions. CT ABDOMEN PELVIS FINDINGS Hepatobiliary: Liver demonstrates a normal contrast enhanced appearance. Layering hyperdensity within the gallbladder lumen most likely reflects sludge as seen on prior ultrasound. Gallbladder mildly distended with trace pericholecystic free fluid. No biliary dilatation. Pancreas: Diffuse inflammatory stranding seen about the pancreas, most notable at the pancreatic head and uncinate process, suggesting acute pancreatitis. No loculated peripancreatic collections or evidence for pancreatic necrosis. Spleen: Spleen within normal limits. Adrenals/Urinary Tract: Adrenal glands are normal. Kidneys equal in size with symmetric enhancement. No nephrolithiasis, hydronephrosis, or focal enhancing renal mass. No hydroureter. Bladder grossly unremarkable, although evaluation limited by streak artifact from right hip arthroplasty. Stomach/Bowel: Stomach moderately distended without acute abnormality. Inflammatory stranding surrounds the duodenum related to the acute inflammatory process within the adjacent pancreas. No evidence for bowel obstruction. Normal appendix. Colonic diverticulosis without evidence for acute diverticulitis. No other acute inflammatory changes seen about the bowels. Vascular/Lymphatic: Normal intravascular enhancement seen throughout the intra-abdominal aorta. Infrarenal aorta ectatic measuring up to 2.7 cm. Moderate aortic atherosclerosis. Mesenteric vessels patent proximally. No evidence for venous thrombosis related to the acute pancreatitis. No adenopathy. Reproductive: Normal prostate. Other: No free air or fluid. Musculoskeletal: Right hip arthroplasty in place. No acute osseous abnormality. No worrisome lytic or blastic osseous lesions. IMPRESSION: CT CHEST IMPRESSION 1. No acute abnormality within the chest. 2. Scattered fibrotic and emphysematous  changes involving the lungs bilaterally with no acute pulmonary disease identified. 3. Myocardial thinning with fatty degeneration at the left ventricular apex, consistent with previous myocardial infarction. 4. 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 Recommend semi-annual imaging followup by CTA or MRA and referral to cardiothoracic surgery if not already obtained. 5. **An incidental finding of potential clinical significance has been found. 7 mm left lower lobe pulmonary nodule, indeterminate. Non-contrast chest CT at 6-12 months is recommended. If the nodule is stable at time of repeat CT, then future CT at 18-24 months (from today's scan) is considered optional for low-risk patients, but is recommended for high-risk patients. This recommendation follows the consensus statement: Guidelines for Management of Incidental Pulmonary Nodules Detected on CT Images: From the Fleischner Society 2017; Radiology 2017; 284:228-243.** CT ABDOMEN AND PELVIS IMPRESSION 1. Acute inflammatory changes surrounding the pancreas, consistent with acute pancreatitis. Correlation with serum lipase recommended. No loculated peripancreatic collections or other complication identified. 2. Mildly distended gallbladder with internal sludge with trace free pericholecystic fluid. Findings felt to likely be secondary in nature due to the acute pancreatic inflammation. 3. Colonic diverticulosis without evidence for acute diverticulitis. 4. 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 Recommend followup by ultrasound in 5 years. This recommendation follows ACR consensus guidelines: White Paper of the ACR Incidental Findings Committee II on Vascular Findings. J Am Coll Radiol 2013; 10:789-794. Electronically Signed   By: Jeannine Boga M.D.   On: 10/17/2017 22:37   US Abdomen Limited  Ruq  Result Date: 10/17/2017 CLINICAL DATA:  77 y/o M; abdominal pain with  nausea and vomiting after eating seafood at approximately 1800 hours today. EXAM: ULTRASOUND ABDOMEN LIMITED RIGHT UPPER QUADRANT COMPARISON:  None. FINDINGS: Gallbladder: Small amount of gallbladder sludge. No gallbladder wall thickening or pericholecystic fluid. Negative sonographic Murphy sign. Common bile duct: Diameter: 4.7 mm Liver: No focal lesion identified. Within normal limits in parenchymal echogenicity. Portal vein is patent on color Doppler imaging with normal direction of blood flow towards the liver. IMPRESSION: Small amount of gallbladder sludge. No secondary signs of acute cholecystitis. Electronically Signed   By: Kristine Garbe M.D.   On: 10/17/2017 21:19    Labs Recent Results (from the past 2160 hour(s))  CBC     Status: Abnormal   Collection Time: 10/17/17  7:20 PM  Result Value Ref Range   WBC 15.2 (H) 3.8 - 10.6 K/uL   RBC 4.16 (L) 4.40 - 5.90 MIL/uL   Hemoglobin 12.8 (L) 13.0 - 18.0 g/dL   HCT 38.9 (L) 40.0 - 52.0 %   MCV 93.6 80.0 - 100.0 fL   MCH 30.7 26.0 - 34.0 pg   MCHC 32.8 32.0 - 36.0 g/dL   RDW 13.9 11.5 - 14.5 %   Platelets 215 150 - 440 K/uL    Comment: Performed at [facility], [address] [place], [place] [address]  Comprehensive metabolic panel     Status: Abnormal   Collection Time: 10/17/17  7:20 PM  Result Value Ref Range   Sodium 141 135 - 145 mmol/L   Potassium 4.3 3.5 - 5.1 mmol/L   Chloride 111 98 - 111 mmol/L   CO2 23 22 - 32 mmol/L   Glucose, Bld 144 (H) 70 - 99 mg/dL   BUN 27 (H) 8 - 23 mg/dL   Creatinine, Ser 1.69 (H) 0.61 - 1.24 mg/dL   Calcium 8.8 (L) 8.9 - 10.3 mg/dL   Total Protein 6.7 6.5 - 8.1 g/dL   Albumin 4.0 3.5 - 5.0 g/dL   AST 85 (H) 15 - 41 U/L   ALT 57 (H) 0 - 44 U/L   Alkaline Phosphatase 62 38 - 126 U/L   Total Bilirubin 0.6 0.3 - 1.2 mg/dL   GFR calc non Af Amer 37 (L) >60 mL/min   GFR calc Af Amer 43 (L) >60 mL/min    Comment: (NOTE) The eGFR has been calculated using the CKD EPI  equation. This calculation has not been validated in all clinical situations. eGFR's persistently <60 mL/min signify possible Chronic Kidney Disease.    Anion gap 7 5 - 15    Comment: Performed at [facility], [place], [place] [address]  Lipase, blood     Status: Abnormal   Collection Time: 10/17/17  7:20 PM  Result Value Ref Range   Lipase 4,365 (H) 11 - 51 U/L    Comment: RESULT CONFIRMED BY MANUAL DILUTION. JML Performed at [facility], [address] [place], [place] [address]   Troponin I     Status: None   Collection Time: 10/17/17  7:20 PM  Result Value Ref Range   Troponin I <0.03 <0.03 ng/mL    Comment: Performed at [facility], [address] [place], [place] [address]  Troponin I     Status: None   Collection Time: 10/17/17 10:11 PM  Result Value Ref Range   Troponin I <0.03 <0.03 ng/mL    Comment: Performed at [facility], [address] [place], [place] [address]  Urinalysis, Complete w Microscopic     Status: Abnormal   Collection Time: 10/17/17 10:11 PM  Result Value Ref Range   Color, Urine YELLOW (A) YELLOW   APPearance CLEAR (A) CLEAR   Specific Gravity, Urine 1.013 1.005 - 1.030   pH 5.0 5.0 - 8.0   Glucose, UA NEGATIVE NEGATIVE mg/dL   Hgb urine dipstick NEGATIVE NEGATIVE   Bilirubin Urine NEGATIVE NEGATIVE   Ketones, ur NEGATIVE NEGATIVE mg/dL   Protein, ur 30 (A) NEGATIVE mg/dL   Nitrite NEGATIVE NEGATIVE   Leukocytes, UA NEGATIVE NEGATIVE   RBC / HPF 0-5 0 - 5 RBC/hpf   WBC, UA 0-5 0 - 5 WBC/hpf   Bacteria, UA NONE SEEN NONE SEEN   Squamous Epithelial / LPF NONE SEEN 0 - 5   Mucus PRESENT    Hyaline Casts, UA PRESENT     Comment: Performed at [facility], [address] [place], [place] [address]  Lipase, blood     Status: None   Collection Time: 10/20/17  3:22 PM  Result Value Ref Range   Lipase 35 11 - 51 U/L    Comment: Performed at [facility], [address] [place], [place] [address]  Comprehensive metabolic panel     Status: Abnormal   Collection Time: 10/20/17  3:22 PM  Result Value Ref Range   Sodium 136 135 - 145 mmol/L   Potassium 4.1 3.5 - 5.1 mmol/L   Chloride 105 98 - 111 mmol/L   CO2 21 (L) 22 - 32 mmol/L   Glucose, Bld 158 (H) 70 - 99 mg/dL   BUN 37 (H) 8 - 23 mg/dL   Creatinine, Ser 2.89 (H) 0.61 - 1.24 mg/dL   Calcium 8.5 (L) 8.9 - 10.3 mg/dL   Total Protein 7.0 6.5 - 8.1 g/dL   Albumin 3.5 3.5 - 5.0 g/dL   AST 89 (H) 15 - 41 U/L   ALT 50 (H) 0 - 44 U/L   Alkaline Phosphatase 75 38 - 126 U/L   Total Bilirubin 1.1 0.3 - 1.2 mg/dL   GFR calc non Af Amer 19 (L) >60 mL/min   GFR calc Af Amer 22 (L) >60 mL/min    Comment: (NOTE) The eGFR has been calculated using the CKD EPI equation. This calculation has not been validated in all clinical situations. eGFR's persistently <60 mL/min signify possible Chronic Kidney Disease.    Anion gap 10 5 - 15    Comment: Performed at [facility], [address] [place], [place] [address]  CBC     Status: Abnormal   Collection Time: 10/20/17  3:22 PM  Result Value Ref Range   WBC 17.8 (H) 3.8 - 10.6 K/uL   RBC 3.67 (L) 4.40 - 5.90 MIL/uL   Hemoglobin 11.5 (L) 13.0 - 18.0 g/dL   HCT 34.0 (L) 40.0 - 52.0 %   MCV 92.8 80.0 - 100.0 fL   MCH 31.3 26.0 - 34.0 pg   MCHC 33.7 32.0 - 36.0 g/dL   RDW 14.3 11.5 - 14.5 %   Platelets 210 150 - 440 K/uL    Comment: Performed at [facility], [address] [place],  [address]  Urinalysis, Complete w Microscopic     Status: Abnormal   Collection Time: 10/20/17  3:50 PM  Result Value Ref Range   Color, Urine YELLOW (A) YELLOW   APPearance HAZY (A) CLEAR   Specific Gravity,  Urine 1.012 1.005 - 1.030   pH 5.0 5.0 - 8.0   Glucose, UA NEGATIVE NEGATIVE mg/dL   Hgb urine dipstick MODERATE (A) NEGATIVE   Bilirubin Urine NEGATIVE NEGATIVE   Ketones, ur 5 (A) NEGATIVE mg/dL   Protein, ur 30 (A) NEGATIVE mg/dL    Nitrite NEGATIVE NEGATIVE   Leukocytes, UA NEGATIVE NEGATIVE   RBC / HPF 0-5 0 - 5 RBC/hpf   WBC, UA 0-5 0 - 5 WBC/hpf   Bacteria, UA NONE SEEN NONE SEEN   Squamous Epithelial / LPF 0-5 0 - 5   Mucus PRESENT    Amorphous Crystal PRESENT    Ca Oxalate Crys, UA PRESENT     Comment: Performed at [facility], [address] [place], [place] [address]  Glucose, capillary     Status: Abnormal   Collection Time: 10/20/17  9:54 PM  Result Value Ref Range   Glucose-Capillary 138 (H) 70 - 99 mg/dL  Basic metabolic panel     Status: Abnormal   Collection Time: 10/21/17  4:56 AM  Result Value Ref Range   Sodium 136 135 - 145 mmol/L   Potassium 3.6 3.5 - 5.1 mmol/L   Chloride 108 98 - 111 mmol/L   CO2 21 (L) 22 - 32 mmol/L   Glucose, Bld 118 (H) 70 - 99 mg/dL   BUN 31 (H) 8 - 23 mg/dL   Creatinine, Ser 2.19 (H) 0.61 - 1.24 mg/dL   Calcium 7.7 (L) 8.9 - 10.3 mg/dL   GFR calc non Af Amer 27 (L) >60 mL/min   GFR calc Af Amer 31 (L) >60 mL/min    Comment: (NOTE) The eGFR has been calculated using the CKD EPI equation. This calculation has not been validated in all clinical situations. eGFR's persistently <60 mL/min signify possible Chronic Kidney Disease.    Anion gap 7 5 - 15    Comment: Performed at [facility], [address] [place], [place] [address]  CBC     Status: Abnormal   Collection Time: 10/21/17  4:56 AM  Result Value Ref Range   WBC 13.7 (H) 3.8 - 10.6 K/uL   RBC 3.31 (L) 4.40 - 5.90 MIL/uL   Hemoglobin 10.6 (L) 13.0 - 18.0 g/dL   HCT 30.9 (L) 40.0 - 52.0 %   MCV 93.2 80.0 - 100.0 fL   MCH 32.0 26.0 - 34.0 pg   MCHC 34.3 32.0 - 36.0 g/dL   RDW 14.2 11.5 - 14.5 %   Platelets 180 150 - 440 K/uL    Comment: Performed at [facility], [address] [place], [place] [address]  Triglycerides     Status: Abnormal   Collection Time: 10/21/17  4:56 AM  Result Value Ref Range   Triglycerides 221 (H) <150 mg/dL    Comment: Performed at  [facility], [address] [place], [place] [address]  IgG 4     Status: None   Collection Time: 10/21/17  4:56 AM  Result Value Ref Range   IgG, Subclass _0 - 96 mg/dL    Comment: (NOTE) Performed At: [facility] [place], [place] [id number] Rush Farmer MD OE:[id number]   TSH     Status: None   Collection Time: 10/21/17  4:56 AM  Result Value Ref Range   TSH 3.643 0.350 - 4.500 uIU/mL    Comment: Performed by a 3rd Generation assay with a functional sensitivity of <=0.01 uIU/mL. Performed at [facility],  [place], [place] [address]   Glucose, capillary     Status: Abnormal   Collection Time: 10/21/17  7:58 AM  Result Value Ref Range   Glucose-Capillary 104 (H) 70 - 99 mg/dL  CULTURE, BLOOD (ROUTINE X 2) w Reflex to ID Panel     Status: None   Collection Time: 10/21/17  9:02 AM  Result Value Ref Range   Specimen Description BLOOD    Special Requests NONE    Culture      NO GROWTH 5 DAYS Performed at [facility], [address] [place], [place] [address]    Report Status 10/26/2017 FINAL   CULTURE, BLOOD (ROUTINE X 2) w Reflex to ID Panel     Status: None   Collection Time: 10/21/17  9:15 AM  Result Value Ref Range   Specimen Description BLOOD    Special Requests NONE    Culture      NO GROWTH 5 DAYS Performed at [facility], [address] [place], [place] [address]    Report Status 10/26/2017 FINAL   Glucose, capillary     Status: Abnormal   Collection Time: 10/21/17 12:00 PM  Result Value Ref Range   Glucose-Capillary 104 (H) 70 - 99 mg/dL  Glucose, capillary     Status: Abnormal   Collection Time: 10/21/17  4:26 PM  Result Value Ref Range   Glucose-Capillary 120 (H) 70 - 99 mg/dL  Glucose, capillary     Status: Abnormal   Collection Time: 10/21/17  8:31 PM  Result Value Ref Range   Glucose-Capillary 199 (H) 70 - 99 mg/dL  CBC     Status: Abnormal   Collection Time:  10/22/17  3:26 AM  Result Value Ref Range   WBC 11.8 (H) 3.8 - 10.6 K/uL   RBC 3.35 (L) 4.40 - 5.90 MIL/uL   Hemoglobin 10.6 (L) 13.0 - 18.0 g/dL   HCT 30.9 (L) 40.0 - 52.0 %   MCV 92.4 80.0 - 100.0 fL   MCH 31.5 26.0 - 34.0 pg   MCHC 34.1 32.0 - 36.0 g/dL   RDW 14.2 11.5 - 14.5 %   Platelets 209 150 - 440 K/uL    Comment: Performed at [facility], [address] [place], [place] [address]  Comprehensive metabolic panel     Status: Abnormal   Collection Time: 10/22/17  3:26 AM  Result Value Ref Range   Sodium 137 135 - 145 mmol/L   Potassium 3.8 3.5 - 5.1 mmol/L   Chloride 107 98 - 111 mmol/L   CO2 23 22 - 32 mmol/L   Glucose, Bld 154 (H) 70 - 99 mg/dL   BUN 23 8 - 23 mg/dL   Creatinine, Ser 1.97 (H) 0.61 - 1.24 mg/dL   Calcium 8.1 (L) 8.9 - 10.3 mg/dL   Total Protein 6.3 (L) 6.5 - 8.1 g/dL   Albumin 2.9 (L) 3.5 - 5.0 g/dL   AST 47 (H) 15 - 41 U/L   ALT 39 0 - 44 U/L   Alkaline Phosphatase 68 38 - 126 U/L   Total Bilirubin 0.9 0.3 - 1.2 mg/dL   GFR calc non Af Amer 31 (L) >60 mL/min   GFR calc Af Amer 36 (L) >60 mL/min    Comment: (NOTE) The eGFR has been calculated using the CKD EPI equation. This calculation has not been validated in all clinical situations. eGFR's persistently <60 mL/min signify possible Chronic Kidney Disease.    Anion gap 7  5 - 15    Comment: Performed at [facility], [address] [place], [place] [address]  Lipase, blood     Status: None   Collection Time: 10/22/17  3:26 AM  Result Value Ref Range   Lipase 23 11 - 51 U/L    Comment: Performed at [facility], [address] [place], [place] [address]  Glucose, capillary     Status: Abnormal   Collection Time: 10/22/17  8:10 AM  Result Value Ref Range   Glucose-Capillary 136 (H) 70 - 99 mg/dL  Glucose, capillary     Status: Abnormal   Collection Time: 10/22/17 11:49 AM  Result Value Ref Range   Glucose-Capillary 164 (H) 70 - 99 mg/dL  Glucose, capillary      Status: Abnormal   Collection Time: 10/22/17  5:05 PM  Result Value Ref Range   Glucose-Capillary 177 (H) 70 - 99 mg/dL  Surgical pcr screen     Status: None   Collection Time: 10/23/17 12:20 AM  Result Value Ref Range   MRSA, PCR NEGATIVE NEGATIVE   Staphylococcus aureus NEGATIVE NEGATIVE    Comment: (NOTE) The Xpert SA Assay (FDA approved for NASAL specimens in patients 59 years of age and older), is one component of a comprehensive surveillance program. It is not intended to diagnose infection nor to guide or monitor treatment. Performed at [facility], [address] [place], [place] [address]   Glucose, capillary     Status: Abnormal   Collection Time: 10/23/17  8:22 AM  Result Value Ref Range   Glucose-Capillary 139 (H) 70 - 99 mg/dL  Surgical pathology     Status: None   Collection Time: 10/23/17 10:31 AM  Result Value Ref Range   SURGICAL PATHOLOGY      Surgical Pathology CASE: [id number] PATIENT: Erline Hau Surgical Pathology Report     SPECIMEN SUBMITTED: A. Gallbladder  CLINICAL HISTORY: None provided  PRE-OPERATIVE DIAGNOSIS: Gallstone pancreatitis  POST-OPERATIVE DIAGNOSIS: Same as pre-op     DIAGNOSIS: A. GALLBLADDER; CHOLECYSTECTOMY: - CHRONIC CHOLECYSTITIS. - NEGATIVE FOR MALIGNANCY.   GROSS DESCRIPTION: A. Labeled: Gallbladder Received: Formalin Size of specimen: 8.2 x 4.7 x 1.1 cm Previously opened: Yes, the gallbladder neck adjacent to the cystic duct surgical resection margin displays a 1.8 x 1.0 cm transmural defect. External surface: Tan-pink and smooth with attached adipose tissue Wall thickness: 0.1 cm (uniform) Mucosa: Tan-pink and velvety with no abnormalities or polyps grossly identified Stones present: Absent Other findings: Cystic duct surgical resection margin inked blue.  No lymph node is identified.  The gallbladder lumen contains green-red bile.  Block summary:  1 - cystic duct surgical resection  margin (en face) and gallbladder wall   Final Diagnosis performed by Quay Burow, MD.   Electronically signed 10/25/2017 11:28:52AM The electronic signature indicates that the named Attending Pathologist has evaluated the specimen  Technical component performed at [facility], [address] [place], [place] [address] Lab: [phone] Dir: Rush Farmer, MD, MMM  Professional component performed at [facility], [address], [place], [place] [address] Lab: [phone] Dir: Dellia Nims. Rubinas, MD   Glucose, capillary     Status: Abnormal   Collection Time: 10/23/17 10:53 AM  Result Value Ref Range   Glucose-Capillary 146 (H) 70 - 99 mg/dL  Glucose, capillary     Status: Abnormal   Collection Time: 10/23/17  4:40 PM  Result Value Ref Range   Glucose-Capillary 213 (H) 70 - 99 mg/dL  Comment 1 Notify RN   Glucose, capillary     Status: Abnormal   Collection Time: 10/23/17  9:09 PM  Result Value Ref Range   Glucose-Capillary 278 (H) 70 - 99 mg/dL  Glucose, capillary     Status: Abnormal   Collection Time: 10/24/17  7:55 AM  Result Value Ref Range   Glucose-Capillary 250 (H) 70 - 99 mg/dL  CBC     Status: Abnormal   Collection Time: 10/24/17 10:26 AM  Result Value Ref Range   WBC 16.7 (H) 3.8 - 10.6 K/uL   RBC 3.54 (L) 4.40 - 5.90 MIL/uL   Hemoglobin 11.1 (L) 13.0 - 18.0 g/dL   HCT 33.1 (L) 40.0 - 52.0 %   MCV 93.3 80.0 - 100.0 fL   MCH 31.2 26.0 - 34.0 pg   MCHC 33.5 32.0 - 36.0 g/dL   RDW 14.7 (H) 11.5 - 14.5 %   Platelets 318 150 - 440 K/uL    Comment: Performed at [facility], [address] [place], [place] [address]  Comprehensive metabolic panel     Status: Abnormal   Collection Time: 10/24/17 10:26 AM  Result Value Ref Range   Sodium 133 (L) 135 - 145 mmol/L   Potassium 4.4 3.5 - 5.1 mmol/L   Chloride 102 98 - 111 mmol/L   CO2 22 22 - 32 mmol/L   Glucose, Bld 324 (H) 70 - 99 mg/dL   BUN 31 (H) 8 - 23 mg/dL   Creatinine, Ser  2.00 (H) 0.61 - 1.24 mg/dL   Calcium 8.3 (L) 8.9 - 10.3 mg/dL   Total Protein 6.7 6.5 - 8.1 g/dL   Albumin 2.9 (L) 3.5 - 5.0 g/dL   AST 43 (H) 15 - 41 U/L   ALT 44 0 - 44 U/L   Alkaline Phosphatase 99 38 - 126 U/L   Total Bilirubin 0.5 0.3 - 1.2 mg/dL   GFR calc non Af Amer 30 (L) >60 mL/min   GFR calc Af Amer 35 (L) >60 mL/min    Comment: (NOTE) The eGFR has been calculated using the CKD EPI equation. This calculation has not been validated in all clinical situations. eGFR's persistently <60 mL/min signify possible Chronic Kidney Disease.    Anion gap 9 5 - 15    Comment: Performed at [facility], [address] [place], [place] [address]  Glucose, capillary     Status: Abnormal   Collection Time: 10/24/17 11:53 AM  Result Value Ref Range   Glucose-Capillary 325 (H) 70 - 99 mg/dL    Assessment/Plan:  Hypertension blood pressure control important in reducing the progression of atherosclerotic disease and aneurysmal growth. On appropriate oral medications.   Hyperlipidemia lipid control important in reducing the progression of atherosclerotic disease. 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  He has atherosclerotic changes and ectasia of his abdominal aorta and iliac arteries without frank aneurysmal degeneration.  At the sizes, they pose him no immediate risk but blood pressure control and abstinence from tobacco were important to keep these from growing.  This will be checked again in 1 year and I will go ahead and do a CT scan of the chest abdomen and pelvis for follow-up.  He will contact our office with any problems in the interim.  Aortic atherosclerosis (HCC) With mild ectasia.  Recheck in 1 year.      Leotis Pain 11/14/2017, 4:29 PM  This note was created with Dragon medical transcription system.  Any errors from dictation are unintentional.

## 2017-11-14 NOTE — Patient Instructions (Signed)
Thoracic Aortic Aneurysm An aneurysm is a bulge in an artery. It happens when blood pushes up against a weakened or damaged artery wall. A thoracic aortic aneurysm is an aneurysm that occurs in the first part of the aorta, between the heart and the diaphragm. The aorta is the main artery of the body. It supplies blood from the heart to the rest of the body. Some aneurysms may not cause symptoms or problems. However, the major concern with a thoracic aortic aneurysm is that it can enlarge and burst (rupture), or blood can flow between the layers of the wall of the aorta through a tear (aorticdissection). Both of these conditions can cause bleeding inside the body and can be life-threatening if they are not diagnosed and treated right away. What are the causes? The exact cause of this condition is not known. What increases the risk? The following factors may make you more likely to develop this condition:  Being age 65 or older.  Having a hardening of the arteries caused by the buildup of fat and other substances in the lining of a blood vessel (arteriosclerosis).  Having inflammation of the walls of an artery (arteritis).  Having a genetic disease that weakens the body's connective tissue, such as Marfan syndrome.  Having an injury or trauma to the aorta.  Having an infection that is caused by bacteria, such as syphilis or staphylococcus, in the wall of the aorta (infectious aortitis).  Having high blood pressure (hypertension).  Being male.  Being white (Caucasian).  Having high cholesterol.  Having a family history of aneurysms.  Using tobacco.  Having chronic obstructive pulmonary disease (COPD).  What are the signs or symptoms? Symptoms of this condition vary depending on the size and rate of growth of the aneurysm. Most grow slowly and do not cause any symptoms. When symptoms do occur, they may include:  Pain in the chest, back, sides, or abdomen. The pain may vary in  intensity. A sudden onset of severe pain may indicate that the aneurysm has ruptured.  Hoarseness.  Cough.  Shortness of breath.  Swallowing problems.  Swelling in the face, arms, or legs.  Fever.  Unexplained weight loss.  How is this diagnosed? This condition may be diagnosed with:  An ultrasound.  X-rays.  A CT scan.  An MRI.  Tests to check the arteries for damage or blockages (angiogram).  Most unruptured thoracic aortic aneurysms cause no symptoms, so they are often found during exams for other conditions. How is this treated? Treatment for this condition depends on:  The size of the aneurysm.  How fast the aneurysm is growing.  Your age.  Risk factors for rupture.  Aneurysms that are smaller than 2.2 inches (5.5 cm) may be managed by using medicines to control blood pressure, manage pain, or fight infection. You may need regular monitoring to see if the aneurysm is getting bigger. Your health care provider may recommend that you have an ultrasound every year or every 6 months. How often you need to have an ultrasound depends on the size of the aneurysm, how fast it is growing, and whether you have a family history of aneurysms. Surgical repair may be needed if your aneurysm is larger than 2.2 inches or if it is growing quickly. Follow these instructions at home: Eating and drinking  Eat a healthy diet. Your health care provider may recommend that you: ? Lower your salt (sodium) intake. In some people, too much salt can raise blood pressure and increase   the risk of thoracic aortic aneurysm. ? Avoid foods that are high in saturated fat and cholesterol, such as red meat and dairy. ? Eat a diet that is low in sugar. ? Increase your fiber intake by including whole grains, vegetables, and fruits in your diet. Eating these foods may help to lower blood pressure.  Limit or avoid alcohol as recommended by your health care provider. Lifestyle  Follow instructions  from your health care provider about healthy lifestyle habits. Your health care provider may recommend that you: ? Do not use any products that contain nicotine or tobacco, such as cigarettes and e-cigarettes. If you need help quitting, ask your health care provider. ? Keep your blood pressure within normal limits. The target limit for most people is below 120/80. Check your blood pressure regularly. If it is high, ask your health care provider about ways that you can control it. ? Keep your blood sugar (glucose) level and cholesterol levels within normal limits. Target limits for most people are:  Blood glucose level: Less than 100 mg/dL.  Total cholesterol level: Less than 200 mg/dL. ? Maintain a healthy weight. Activity  Stay physically active and exercise regularly. Talk with your health care provider about how often you should exercise and ask which types of exercise are safe for you.  Avoid heavy lifting and activities that take a lot of effort (are strenuous). Ask your health care provider what activities are safe for you. General instructions  Keep all follow-up visits as told by your health care provider. This is important. ? Talk with your health care provider about regular screenings to see if the aneurysm is getting bigger.  Take over-the-counter and prescription medicines only as told by your health care provider. Contact a health care provider if:  You have discomfort in your upper back, neck, or abdomen.  You have trouble swallowing.  You have a cough or hoarseness.  You have a family history of aneurysms.  You have unexplained weight loss. Get help right away if:  You have sudden, severe pain in your upper back and abdomen. This pain may move into your chest and arms.  You have shortness of breath.  You have a fever. This information is not intended to replace advice given to you by your health care provider. Make sure you discuss any questions you have with your  health care provider. Document Released: 02/18/2005 Document Revised: 12/01/2015 Document Reviewed: 12/01/2015 Elsevier Interactive Patient Education  2018 [employer]  

## 2017-11-14 NOTE — Assessment & Plan Note (Signed)
lipid control important in reducing the progression of atherosclerotic disease. Continue statin therapy  

## 2017-11-14 NOTE — Assessment & Plan Note (Signed)
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  He has atherosclerotic changes and ectasia of his abdominal aorta and iliac arteries without frank aneurysmal degeneration.  At the sizes, they pose him no immediate risk but blood pressure control and abstinence from tobacco were important to keep these from growing.  This will be checked again in 1 year and I will go ahead and do a CT scan of the chest abdomen and pelvis for follow-up.  He will contact our office with any problems in the interim.

## 2018-10-14 ENCOUNTER — Other Ambulatory Visit: Payer: Self-pay

## 2018-10-14 DIAGNOSIS — Z20822 Contact with and (suspected) exposure to covid-19: Secondary | ICD-10-CM

## 2018-10-15 LAB — NOVEL CORONAVIRUS, NAA: SARS-CoV-2, NAA: NOT DETECTED

## 2018-10-16 ENCOUNTER — Other Ambulatory Visit (INDEPENDENT_AMBULATORY_CARE_PROVIDER_SITE_OTHER): Payer: Self-pay | Admitting: Vascular Surgery

## 2018-11-13 ENCOUNTER — Other Ambulatory Visit: Payer: Medicare Other

## 2018-11-13 ENCOUNTER — Ambulatory Visit
Admission: RE | Admit: 2018-11-13 | Discharge: 2018-11-13 | Disposition: A | Discharge status: Home / Self Care | Payer: Medicare Other | Source: Ambulatory Visit | Attending: Vascular Surgery | Admitting: Vascular Surgery

## 2018-11-13 ENCOUNTER — Other Ambulatory Visit: Payer: Self-pay

## 2018-11-13 DIAGNOSIS — I712 Thoracic aortic aneurysm, without rupture, unspecified: Secondary | ICD-10-CM

## 2018-11-13 LAB — POCT I-STAT CREATININE: Creatinine, Ser: 1.5 mg/dL — ABNORMAL HIGH (ref 0.61–1.24)

## 2018-11-13 MED ORDER — IOHEXOL 350 MG/ML SOLN
100.0000 mL | Freq: Once | INTRAVENOUS | Status: AC | PRN
  Administered 2018-11-13: 80 mL via INTRAVENOUS

## 2018-11-16 ENCOUNTER — Ambulatory Visit: Payer: Medicare Other

## 2018-11-17 ENCOUNTER — Other Ambulatory Visit: Payer: Self-pay

## 2018-11-17 ENCOUNTER — Ambulatory Visit (INDEPENDENT_AMBULATORY_CARE_PROVIDER_SITE_OTHER): Payer: Medicare Other | Admitting: Vascular Surgery

## 2018-11-17 ENCOUNTER — Encounter (INDEPENDENT_AMBULATORY_CARE_PROVIDER_SITE_OTHER): Payer: Self-pay | Admitting: Vascular Surgery

## 2018-11-17 VITALS — BP 122/65 | HR 68 | Resp 16 | Ht 72.0 in | Wt 235.0 lb

## 2018-11-17 DIAGNOSIS — I723 Aneurysm of iliac artery: Secondary | ICD-10-CM | POA: Insufficient documentation

## 2018-11-17 DIAGNOSIS — I10 Essential (primary) hypertension: Secondary | ICD-10-CM

## 2018-11-17 DIAGNOSIS — I712 Thoracic aortic aneurysm, without rupture, unspecified: Secondary | ICD-10-CM

## 2018-11-17 DIAGNOSIS — E785 Hyperlipidemia, unspecified: Secondary | ICD-10-CM

## 2018-11-17 NOTE — Progress Notes (Signed)
MRN : [id number]  [name] [name] is a 78 y.o. (03-Jan-1941) male who presents with chief complaint of  Chief Complaint  Patient presents with   Follow-up  .  History of Present Illness: Patient returns today in follow up of his thoracic aortic aneurysm.  He is doing well today without complaints.  He has no aneurysm related symptoms such as abdominal or back pain, chest pain, or signs of peripheral embolization.  He recently underwent a CT scan of the chest abdomen pelvis which I have reviewed.  His thoracic aorta in the mid a sending aorta measured approximately 4.4 to 4.5 cm in maximal diameter.  The vessels then normalized down through his aorta in the abdomen.  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  Current Outpatient Medications  Medication Sig Dispense Refill   aspirin EC 81 MG tablet Take 81 mg by mouth daily.      Cyanocobalamin (B-12) 2500 MCG TABS Take 2,500 mcg by mouth daily.     cyclobenzaprine (FLEXERIL) 10 MG tablet Take 10 mg by mouth 3 (three) times daily as needed for muscle spasms.      furosemide (LASIX) 20 MG tablet Take 20 mg by mouth daily as needed for edema.     gabapentin (NEURONTIN) 300 MG capsule Take 300 mg by mouth at bedtime.      Insulin Glargine-Lixisenatide (SOLIQUA Dyer) Inject into the skin daily.     levocetirizine (XYZAL) 5 MG tablet Take 5 mg by mouth every evening.     levothyroxine (SYNTHROID, LEVOTHROID) 50 MCG tablet Take 50 mcg by mouth daily before breakfast.     loperamide (IMODIUM) 2 MG capsule Take 2 mg by mouth as needed for diarrhea or loose stools.     metFORMIN (GLUCOPHAGE) 500 MG tablet Take 500 mg by mouth 2 (two) times daily.      Multiple Vitamin (MULTIVITAMIN WITH MINERALS) TABS tablet Take 1 tablet by mouth daily.     Multiple Vitamins-Minerals (PRESERVISION AREDS 2) CAPS Take 1 capsule by mouth 2 (two) times daily.     nitroGLYCERIN (NITROSTAT) 0.4 MG SL tablet Place 1 tablet  under the tongue as needed. For chest pain     nystatin (MYCOSTATIN/NYSTOP) powder Apply 1 g topically daily as needed (jock itch).     omeprazole (PRILOSEC) 40 MG capsule Take 40 mg by mouth daily.      rosuvastatin (CRESTOR) 20 MG tablet Take 20 mg by mouth at bedtime.      sildenafil (REVATIO) 20 MG tablet Take 40-60 mg by mouth daily as needed (erectile dysfunction).     lisinopril (PRINIVIL,ZESTRIL) 5 MG tablet Take 5 mg by mouth at bedtime.      No current facility-administered medications for this visit.     Past Medical History:  Diagnosis Date   Anemia    Arthritis    CAD (coronary artery disease)    Chronic kidney disease    CKD stage 3   DDD (degenerative disc disease), thoracic    Diabetes mellitus without complication (HCC)    GERD (gastroesophageal reflux disease)    Heart murmur    History of heart artery stent 2014   Per patient, [name] put in 2 stents in 2014.   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   PVD (peripheral vascular disease) [facility])     Past Surgical History:  Procedure Laterality Date   BACK SURGERY     BICEPT TENODESIS Left 05/13/2017   Procedure: BICEPS TENODESIS;  Surgeon: [name], MD;  Location: [facility] ORS;  Service: Orthopedics;  Laterality: Left;   CHOLECYSTECTOMY N/A 10/23/2017   Procedure: LAPAROSCOPIC CHOLECYSTECTOMY;  Surgeon: [name], MD;  Location: [facility] ORS;  Service: General;  Laterality: N/A;   CORONARY ANGIOPLASTY     JOINT REPLACEMENT Bilateral    knee   JOINT REPLACEMENT Right    hip   SHOULDER ARTHROSCOPY WITH OPEN ROTATOR CUFF REPAIR Left 05/13/2017   Procedure: SHOULDER ARTHROSCOPY WITH OPEN ROTATOR CUFF REPAIR;  Surgeon: [name], MD;  Location: [facility] ORS;  Service: Orthopedics;  Laterality: Left;  subacromial decompression, debridement    TONSILLECTOMY     UMBILICAL HERNIA REPAIR  10/23/2017   Procedure: HERNIA REPAIR UMBILICAL ADULT;  Surgeon: [name], MD;  Location: [facility] OR[facility];  Service: General;;   VASECTOMY      Social History Social History   Tobacco Use   Smoking status: Former Smoker    Types: Cigarettes    Quit date: 01/04/1998    Years since quitting: 20.8   Smokeless tobacco: Never Used  Substance Use Topics   Alcohol use: Yes    Alcohol/week: 12.0 - 14.0 standard drinks    Types: 6 - 7 Cans of beer, 6 - 7 Standard drinks or equivalent per week   Drug use: No    Family History Family History  Problem Relation Age of Onset   Stroke Father    Diabetes Paternal Grandmother     Allergies  Allergen Reactions   Other    Sulfamethoxazole Hives   Tomato     Breaks out   Fish Oil Rash     REVIEW OF SYSTEMS (Negative unless checked)  Constitutional: [] Weight loss  [] Fever  [] Chills Cardiac: [] Chest pain   [] Chest pressure   [] Palpitations   [] Shortness of breath when laying flat   [] Shortness of breath at rest   [] Shortness of breath with exertion. Vascular:  [] Pain in legs with walking   [] Pain in legs at rest   [] Pain in legs when laying flat   [] Claudication   [] Pain in feet when walking  [] Pain in feet at rest  [] Pain in feet when laying flat   [] History of DVT   [] Phlebitis   [] Swelling in legs   [] Varicose veins   [] Non-healing ulcers Pulmonary:   [] Uses home oxygen   [] Productive cough   [] Hemoptysis   [] Wheeze  [] COPD   [] Asthma Neurologic:  [] Dizziness  [] Blackouts   [] Seizures   [] History of stroke   [] History of TIA  [] Aphasia   [] Temporary blindness   [] Dysphagia   [] Weakness or numbness in arms   [] Weakness or numbness in legs Musculoskeletal:  [x] Arthritis   [] Joint swelling   [] Joint pain   [] Low back pain Hematologic:  [] Easy bruising  [] Easy bleeding   [] Hypercoagulable state   [x] Anemic   Gastrointestinal:  [] Blood in stool   [] Vomiting blood  [x] Gastroesophageal  reflux/heartburn   [x] Abdominal pain Genitourinary:  [] Chronic kidney disease   [] Difficult urination  [] Frequent urination  [] Burning with urination   [] Hematuria Skin:  [] Rashes   [] Ulcers   [] Wounds Psychological:  [] History of anxiety   []  History of major depression.  Physical Examination  BP 122/65 (BP Location: Right Arm)    Pulse 68  Resp 16    Ht 6' (1.829 m)    Wt 235 lb (106.6 kg)    BMI 31.87 kg/m  Gen:  WD/WN, NAD.  Appears younger than stated age Head: [place]/AT, No temporalis wasting. Ear/Nose/Throat: Hearing grossly intact, nares w/o erythema or drainage Eyes: Conjunctiva clear. Sclera non-icteric Neck: Supple.  Trachea midline Pulmonary:  Good air movement, no use of accessory muscles.  Cardiac: RRR, no JVD Vascular:  Vessel Right Left  Radial Palpable Palpable                                   Gastrointestinal: soft, non-tender/non-distended. No guarding/reflex.  Musculoskeletal: M/S 5/5 throughout.  No deformity or atrophy.  No significant lower extremity edema. Neurologic: Sensation grossly intact in extremities.  Symmetrical.  Speech is fluent.  Psychiatric: Judgment intact, Mood & affect appropriate for pt's clinical situation. Dermatologic: No rashes or ulcers noted.  No cellulitis or open wounds.       Labs Recent Results (from the past 2160 hour(s))  Novel Coronavirus, NAA ([facility])     Status: None   Collection Time: 10/14/18 12:00 AM   Specimen: Oropharyngeal(OP) collection in vial transport medium   OROPHARYNGEA  TESTING  Result Value Ref Range   SARS-CoV-2, NAA Not Detected Not Detected    Comment: Testing was performed using the cobas(R) SARS-CoV-2 test. This test was developed and its performance characteristics determined by [employer]. This test has not been FDA cleared or approved. This test has been authorized by FDA under an Emergency Use Authorization (EUA). This test is only authorized for the duration of time the  declaration that circumstances exist justifying the authorization of the emergency use of in vitro diagnostic tests for detection of SARS-CoV-2 virus and/or diagnosis of COVID-19 infection under section 564(b)(1) of the Act, 21 U.S.C. KA:123727), unless the authorization is terminated or revoked sooner. When diagnostic testing is negative, the possibility of a false negative result should be considered in the context of a patient's recent exposures and the presence of clinical signs and symptoms consistent with COVID-19. An individual without symptoms of COVID-19 and who is not shedding SARS-CoV-2 virus would expect to have a negati ve (not detected) result in this assay.   I-STAT creatinine     Status: Abnormal   Collection Time: 11/13/18  9:49 AM  Result Value Ref Range   Creatinine, Ser 1.50 (H) 0.61 - 1.24 mg/dL    Radiology Ct Angio Chest Aorta W/cm &/or Wo/cm  Result Date: 11/13/2018 CLINICAL DATA:  Aortic aneurysm without rupture. Currently asymptomatic. History of pancreatitis, cholelithiasis, post cholecystectomy EXAM: CT ANGIOGRAPHY CHEST, ABDOMEN AND PELVIS TECHNIQUE: Multidetector CT imaging through the chest, abdomen and pelvis was performed using the standard protocol during bolus administration of intravenous contrast. Multiplanar reconstructed images and MIPs were obtained and reviewed to evaluate the vascular anatomy. CONTRAST:  20mL OMNIPAQUE IOHEXOL 350 MG/ML SOLN COMPARISON:  10/20/2017 and previous FINDINGS: CTA CHEST FINDINGS Cardiovascular: Heart size normal. No pericardial effusion. RV is nondilated. Dilated central pulmonary arteries. Satisfactory opacification of pulmonary arteries noted, and there is no evidence of pulmonary emboli. Moderate coronary calcifications. Good contrast opacification of the thoracic aorta. No dissection or stenosis. Transverse dimensions as follows: 3.8 cm sinuses of Valsalva 3.2 cm sino-tubular junction 4.4 cm mid ascending 3.7 cm  distal ascending/proximal arch 3 cm distal arch 2.7 cm proximal descending 2.7 cm distal descending Partially calcified atheromatous plaque through the arch  and descending thoracic segment. No evidence of rupture. Mediastinum/Nodes: 1 cm AP window lymph node. Additional subcentimeter mediastinal nodes. No hilar adenopathy. Lungs/Pleura: No pleural effusion. Subpleural blebs in both lung apices. No pneumothorax. Stable peripheral scarring in the lung bases right greater than left. Musculoskeletal: Anterior vertebral endplate spurring at multiple levels in the mid and lower thoracic spine. DJD in the left shoulder. No fracture or worrisome bone lesion. Review of the MIP images confirms the above findings. CTA ABDOMEN AND PELVIS FINDINGS VASCULAR Aorta: No aneurysm, dissection, or stenosis. Infrarenal segment measures up to 2.7 cm diameter. Eccentric nonocclusive mural thrombus. Celiac: Mild proximal narrowing at the level of the median arcuate ligament of the diaphragm, widely patent distally with unremarkable distal branching. SMA: Widely patent. Replaced hepatic arterial supply, an anatomic variant. Renals: Duplicated right, superior dominant with mild proximal stenosis. 3 left renal arteries. The middle is dominant. The inferior is aberrant, arising below the level of the IMA origin. IMA: Patent without evidence of aneurysm, dissection, vasculitis or significant stenosis. Inflow: 1.6 cm dilatation of the mid right common iliac artery, 1.2 left. Mild scattered atheromatous plaque. Mild tortuosity. External iliacs widely patent. Visualized proximal outflow unremarkable. Veins: Patent portal vein, SMV, splenic vein, bilateral renal veins. No venous pathology identified. Review of the MIP images confirms the above findings. NON-VASCULAR Hepatobiliary: No focal liver abnormality is seen. Status post cholecystectomy. No biliary dilatation. Pancreas: Unremarkable. No pancreatic ductal dilatation or surrounding  inflammatory changes. Peripancreatic edema seen previously has resolved. Spleen: Normal in size without focal abnormality. Adrenals/Urinary Tract: Normal adrenals. Normal kidneys. No hydronephrosis. Urinary bladder incompletely distended. Stomach/Bowel: Stomach is nondilated. Small bowel decompressed. Normal appendix. Colon is nondistended. Scattered diverticula in the proximal sigmoid colon without significant adjacent inflammatory/edematous change or abscess. Lymphatic: No abdominal or pelvic adenopathy. Reproductive: Prostate is unremarkable. Other: No ascites. No free air. Musculoskeletal: Right hip arthroplasty hardware resulting in streak artifact degrading portions of the scan. Spondylitic changes in the lower lumbar spine. No fracture or worrisome bone lesion. Review of the MIP images confirms the above findings. IMPRESSION: 1. No interval enlargement of 4.4 cm ascending thoracic aortic aneurysm . Recommend annual imaging followup by CTA or MRA. This recommendation follows 2010 ACCF/AHA/AATS/ACR/ASA/SCA/SCAI/SIR/STS/SVM Guidelines for the Diagnosis and Management of Patients with Thoracic Aortic Disease. Circulation. 2010; 121: [id number] 2. Ectatic abdominal aorta at risk for aneurysm development. Recommend followup by ultrasound in 5 years. This recommendation follows ACR consensus guidelines: White Paper of the ACR Incidental Findings Committee II on Vascular Findings. J Am Coll Radiol 2013; 10:789-794. 3. 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 4. Sigmoid diverticulosis. Electronically Signed   By: Lucrezia Europe M.D.   On: 11/13/2018 13:58   Ct Angio Abd/pel W/ And/or W/o  Result Date: 11/13/2018 CLINICAL DATA:  Aortic aneurysm without rupture. Currently asymptomatic. History of pancreatitis, cholelithiasis, post cholecystectomy EXAM: CT ANGIOGRAPHY CHEST, ABDOMEN AND PELVIS TECHNIQUE: Multidetector CT imaging through the chest, abdomen and pelvis was performed using the standard protocol  during bolus administration of intravenous contrast. Multiplanar reconstructed images and MIPs were obtained and reviewed to evaluate the vascular anatomy. CONTRAST:  34mL OMNIPAQUE IOHEXOL 350 MG/ML SOLN COMPARISON:  10/20/2017 and previous FINDINGS: CTA CHEST FINDINGS Cardiovascular: Heart size normal. No pericardial effusion. RV is nondilated. Dilated central pulmonary arteries. Satisfactory opacification of pulmonary arteries noted, and there is no evidence of pulmonary emboli. Moderate coronary calcifications. Good contrast opacification of the thoracic aorta. No dissection or stenosis. Transverse dimensions as follows: 3.8 cm sinuses  of Valsalva 3.2 cm sino-tubular junction 4.4 cm mid ascending 3.7 cm distal ascending/proximal arch 3 cm distal arch 2.7 cm proximal descending 2.7 cm distal descending Partially calcified atheromatous plaque through the arch and descending thoracic segment. No evidence of rupture. Mediastinum/Nodes: 1 cm AP window lymph node. Additional subcentimeter mediastinal nodes. No hilar adenopathy. Lungs/Pleura: No pleural effusion. Subpleural blebs in both lung apices. No pneumothorax. Stable peripheral scarring in the lung bases right greater than left. Musculoskeletal: Anterior vertebral endplate spurring at multiple levels in the mid and lower thoracic spine. DJD in the left shoulder. No fracture or worrisome bone lesion. Review of the MIP images confirms the above findings. CTA ABDOMEN AND PELVIS FINDINGS VASCULAR Aorta: No aneurysm, dissection, or stenosis. Infrarenal segment measures up to 2.7 cm diameter. Eccentric nonocclusive mural thrombus. Celiac: Mild proximal narrowing at the level of the median arcuate ligament of the diaphragm, widely patent distally with unremarkable distal branching. SMA: Widely patent. Replaced hepatic arterial supply, an anatomic variant. Renals: Duplicated right, superior dominant with mild proximal stenosis. 3 left renal arteries. The middle is  dominant. The inferior is aberrant, arising below the level of the IMA origin. IMA: Patent without evidence of aneurysm, dissection, vasculitis or significant stenosis. Inflow: 1.6 cm dilatation of the mid right common iliac artery, 1.2 left. Mild scattered atheromatous plaque. Mild tortuosity. External iliacs widely patent. Visualized proximal outflow unremarkable. Veins: Patent portal vein, SMV, splenic vein, bilateral renal veins. No venous pathology identified. Review of the MIP images confirms the above findings. NON-VASCULAR Hepatobiliary: No focal liver abnormality is seen. Status post cholecystectomy. No biliary dilatation. Pancreas: Unremarkable. No pancreatic ductal dilatation or surrounding inflammatory changes. Peripancreatic edema seen previously has resolved. Spleen: Normal in size without focal abnormality. Adrenals/Urinary Tract: Normal adrenals. Normal kidneys. No hydronephrosis. Urinary bladder incompletely distended. Stomach/Bowel: Stomach is nondilated. Small bowel decompressed. Normal appendix. Colon is nondistended. Scattered diverticula in the proximal sigmoid colon without significant adjacent inflammatory/edematous change or abscess. Lymphatic: No abdominal or pelvic adenopathy. Reproductive: Prostate is unremarkable. Other: No ascites. No free air. Musculoskeletal: Right hip arthroplasty hardware resulting in streak artifact degrading portions of the scan. Spondylitic changes in the lower lumbar spine. No fracture or worrisome bone lesion. Review of the MIP images confirms the above findings. IMPRESSION: 1. No interval enlargement of 4.4 cm ascending thoracic aortic aneurysm . Recommend annual imaging followup by CTA or MRA. This recommendation follows 2010 ACCF/AHA/AATS/ACR/ASA/SCA/SCAI/SIR/STS/SVM Guidelines for the Diagnosis and Management of Patients with Thoracic Aortic Disease. Circulation. 2010; 121: [id number] 2. Ectatic abdominal aorta at risk for aneurysm development. Recommend  followup by ultrasound in 5 years. This recommendation follows ACR consensus guidelines: White Paper of the ACR Incidental Findings Committee II on Vascular Findings. J Am Coll Radiol 2013; 10:789-794. 3. 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 4. Sigmoid diverticulosis. Electronically Signed   By: Lucrezia Europe M.D.   On: 11/13/2018 13:58    Assessment/Plan Hypertension blood pressure control important in reducing the progression of atherosclerotic disease and aneurysmal growth. On appropriate oral medications.   Hyperlipidemia lipid control important in reducing the progression of atherosclerotic disease. Continue statin therapy  Aneurysm of thoracic aorta (HCC) CT scan reviewed and his thoracic aortic aneurysm is stable measuring about 4.4 to 4.5 cm in maximal diameter.  Blood pressure control important to prevent progression.  Recheck in 1 year  Iliac artery aneurysm (HCC) Right common iliac artery measured 1.6 cm on CT scan last week.  Well below the threshold for consideration for  repair.  Plan a duplex evaluation in 1 year for follow-up.  Blood pressure control important in reducing progression of aneurysmal growth.    Leotis Pain, MD  11/17/2018 10:36 AM    This note was created with Dragon medical transcription system.  Any errors from dictation are purely unintentional

## 2018-11-17 NOTE — Assessment & Plan Note (Signed)
Right common iliac artery measured 1.6 cm on CT scan last week.  Well below the threshold for consideration for repair.  Plan a duplex evaluation in 1 year for follow-up.  Blood pressure control important in reducing progression of aneurysmal growth.

## 2018-11-17 NOTE — Assessment & Plan Note (Signed)
CT scan reviewed and his thoracic aortic aneurysm is stable measuring about 4.4 to 4.5 cm in maximal diameter.  Blood pressure control important to prevent progression.  Recheck in 1 year

## 2019-02-24 ENCOUNTER — Encounter: Payer: Self-pay | Admitting: Dermatology

## 2019-04-07 ENCOUNTER — Other Ambulatory Visit: Payer: Self-pay | Admitting: Dermatology

## 2019-04-07 DIAGNOSIS — R0989 Other specified symptoms and signs involving the circulatory and respiratory systems: Secondary | ICD-10-CM

## 2019-04-12 ENCOUNTER — Other Ambulatory Visit: Payer: Self-pay

## 2019-04-12 ENCOUNTER — Ambulatory Visit
Admission: RE | Admit: 2019-04-12 | Discharge: 2019-04-12 | Disposition: A | Discharge status: Home / Self Care | Payer: Medicare Other | Source: Ambulatory Visit | Attending: Dermatology | Admitting: Dermatology

## 2019-04-12 DIAGNOSIS — R0989 Other specified symptoms and signs involving the circulatory and respiratory systems: Secondary | ICD-10-CM

## 2019-05-27 ENCOUNTER — Encounter: Payer: Self-pay | Admitting: Dermatology

## 2019-05-27 ENCOUNTER — Ambulatory Visit (INDEPENDENT_AMBULATORY_CARE_PROVIDER_SITE_OTHER): Payer: Medicare Other | Admitting: Dermatology

## 2019-05-27 ENCOUNTER — Other Ambulatory Visit: Payer: Self-pay

## 2019-05-27 DIAGNOSIS — B351 Tinea unguium: Secondary | ICD-10-CM

## 2019-05-27 DIAGNOSIS — B353 Tinea pedis: Secondary | ICD-10-CM

## 2019-05-27 DIAGNOSIS — R21 Rash and other nonspecific skin eruption: Secondary | ICD-10-CM | POA: Diagnosis not present

## 2019-05-27 DIAGNOSIS — I872 Venous insufficiency (chronic) (peripheral): Secondary | ICD-10-CM | POA: Diagnosis not present

## 2019-05-27 DIAGNOSIS — L570 Actinic keratosis: Secondary | ICD-10-CM | POA: Diagnosis not present

## 2019-05-27 MED ORDER — TACROLIMUS 0.1 % EX OINT: TOPICAL_OINTMENT | Freq: Two times a day (BID) | CUTANEOUS | 3 refills | Status: DC

## 2019-05-27 NOTE — Progress Notes (Signed)
Follow-Up Visit   Subjective  [name] [name] is a 79 y.o. male who presents for the following: Follow-up (hypersensitivity , tinea pedis, tinea versicolor, xerosis, statis derm.).   Legs are improving but still sensitive and with rash. Rash on trunk is improving with some itchy areas still present. No problems with medication.  The following portions of the chart were reviewed this encounter and updated as appropriate: Tobacco  Allergies  Meds  Problems  Med Hx  Surg Hx  Fam Hx      Review of Systems: No other skin or systemic complaints.  Objective  Well appearing patient in no apparent distress; mood and affect are within normal limits.  A focused examination was performed including extremities, including the arms, hands, fingers, and fingernails and the legs, feet, toes, and toenails and chest, abdomen, back, scalp, ears, and face. Relevant physical exam findings are noted in the Assessment and Plan.  Objective  Left Malar Cheek, Left Zygomatic Area, Mid Forehead, Mid Frontal Scalp (3): Erythematous thin papules/macules with gritty scale.   Objective  bilateral feet: Thickened toenails with subungual debris  Objective  Trunk: BX proven dermal hypersensitivity. Scattered thin scaly pink plaques at trunk  Objective  Right Foot - Anterior: Clear    Objective  bilateral lower legs: Erythematous, scaly patches involving the ankle and distal lower legs with associated lower leg edema.   Assessment & Plan   AK (actinic keratosis) (6) Mid Frontal Scalp (3); Mid Forehead; Left Zygomatic Area; Left Malar Cheek  Recommend daily broad spectrum sunscreen SPF 30+ to sun-exposed areas, reapply every 2 hours as needed. Call for new or changing lesions.   Destruction of lesion - Left Malar Cheek, Left Zygomatic Area, Mid Forehead, Mid Frontal Scalp (3)  Destruction method: cryotherapy   Informed consent: discussed and consent obtained   Lesion destroyed using liquid  nitrogen: Yes (6)   Outcome: patient tolerated procedure well with no complications   Post-procedure details: wound care instructions given    Onychomycosis bilateral feet  Observe. Continue ciclopirox once weekly   Rash Trunk  Hypersensitivity likely secondary to tinea corporis (which has now resolved). Hypersensitivity is significantly improved.  Continue triamcinolone twice daily as needed to affected areas on the body. Avoid applying to face, groin, and axilla. Use as directed. Risk of skin atrophy with long-term use reviewed.    Topical steroids (such as triamcinolone, fluocinolone, fluocinonide, mometasone, clobetasol, halobetasol, betamethasone, hydrocortisone) can cause thinning and lightening of the skin if they are used for too long in the same area. Your physician has selected the right strength medicine for your problem and area affected on the body. Please use your medication only as directed by your physician to prevent side effects.    Tinea pedis of right foot Right Foot - Anterior  Clear.  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   IDonzetta Kohut, CMA, am acting as scribe for Forest Gleason, MD .  Documentation: I have reviewed the above documentation for accuracy and completeness, and I agree with the above.  Forest Gleason, MD

## 2019-05-27 NOTE — Patient Instructions (Addendum)
Liquid nitrogen was applied for 10-12 seconds to the skin lesion and the expected blistering or scabbing reaction explained. Do not pick at the area. Patient reminded to expect hypopigmented scars from the procedure. Return if lesion fails to fully resolve.  Recommend daily broad spectrum sunscreen SPF 30+ to sun-exposed areas, reapply every 2 hours as needed. Call for new or changing lesions.  Topical steroids (such as triamcinolone, fluocinolone, fluocinonide, mometasone, clobetasol, halobetasol, betamethasone, hydrocortisone) can cause thinning and lightening of the skin if they are used for too long in the same area. Your physician has selected the right strength medicine for your problem and area affected on the body. Please use your medication only as directed by your physician to prevent side effects.

## 2019-05-30 ENCOUNTER — Encounter: Payer: Self-pay | Admitting: Dermatology

## 2019-07-02 ENCOUNTER — Ambulatory Visit: Payer: Medicare Other | Admitting: Dermatology

## 2019-08-11 ENCOUNTER — Other Ambulatory Visit: Payer: Self-pay

## 2019-08-11 ENCOUNTER — Ambulatory Visit (INDEPENDENT_AMBULATORY_CARE_PROVIDER_SITE_OTHER): Payer: Medicare Other | Admitting: Dermatology

## 2019-08-11 DIAGNOSIS — I872 Venous insufficiency (chronic) (peripheral): Secondary | ICD-10-CM

## 2019-08-11 DIAGNOSIS — D1801 Hemangioma of skin and subcutaneous tissue: Secondary | ICD-10-CM

## 2019-08-11 DIAGNOSIS — L821 Other seborrheic keratosis: Secondary | ICD-10-CM

## 2019-08-11 DIAGNOSIS — Z1283 Encounter for screening for malignant neoplasm of skin: Secondary | ICD-10-CM

## 2019-08-11 DIAGNOSIS — L570 Actinic keratosis: Secondary | ICD-10-CM | POA: Diagnosis not present

## 2019-08-11 DIAGNOSIS — L300 Nummular dermatitis: Secondary | ICD-10-CM | POA: Diagnosis not present

## 2019-08-11 DIAGNOSIS — B353 Tinea pedis: Secondary | ICD-10-CM | POA: Diagnosis not present

## 2019-08-11 DIAGNOSIS — L578 Other skin changes due to chronic exposure to nonionizing radiation: Secondary | ICD-10-CM

## 2019-08-11 DIAGNOSIS — L814 Other melanin hyperpigmentation: Secondary | ICD-10-CM

## 2019-08-11 DIAGNOSIS — D229 Melanocytic nevi, unspecified: Secondary | ICD-10-CM

## 2019-08-11 MED ORDER — TACROLIMUS 0.1 % EX OINT: TOPICAL_OINTMENT | Freq: Two times a day (BID) | CUTANEOUS | 3 refills | Status: DC

## 2019-08-11 MED ORDER — TERBINAFINE HCL 250 MG PO TABS: 250.0000 mg | ORAL_TABLET | Freq: Every day | ORAL | 1 refills | Status: AC

## 2019-08-11 NOTE — Patient Instructions (Signed)

## 2019-08-11 NOTE — Progress Notes (Signed)
   Follow-Up Visit   Subjective  [name] [name] is a 79 y.o. male who presents for the following: Annual Exam (TBSE today) and Actinic Keratosis (follow up - treated with LN2 x 6 (by Dr. Laurence Ferrari). Seem to have healed.). The patient presents for Total-Body Skin Exam (TBSE) for skin cancer screening and mole check.  The following portions of the chart were reviewed this encounter and updated as appropriate:  Tobacco  Allergies  Meds  Problems  Med Hx  Surg Hx  Fam Hx      Review of Systems:  No other skin or systemic complaints except as noted in HPI or Assessment and Plan.  Objective  Well appearing patient in no apparent distress; mood and affect are within normal limits.  A full examination was performed including scalp, head, eyes, ears, nose, lips, neck, chest, axillae, abdomen, back, buttocks, bilateral upper extremities, bilateral lower extremities, hands, feet, fingers, toes, fingernails, and toenails. All findings within normal limits unless otherwise noted below.  Objective  Scalp (2): Erythematous thin papules/macules with gritty scale.   Objective  Right Foot - Anterior: Scaliness of feet with toenail dystrophy.  Objective  Mid Back: Scaly pink papules.   Assessment & Plan    Lentigines - Scattered tan macules - Discussed due to sun exposure - Benign, observe - Call for any changes  Seborrheic Keratoses - Stuck-on, waxy, tan-brown papules and plaques  - Discussed benign etiology and prognosis. - Observe - Call for any changes  Melanocytic Nevi - Tan-brown and/or pink-flesh-colored symmetric macules and papules - Benign appearing on exam today - Observation - Call clinic for new or changing moles - Recommend daily use of broad spectrum spf 30+ sunscreen to sun-exposed areas.   Hemangiomas - Red papules - Discussed benign nature - Observe - Call for any changes  Actinic Damage - diffuse scaly erythematous macules with underlying dyspigmentation -  Recommend daily broad spectrum sunscreen SPF 30+ to sun-exposed areas, reapply every 2 hours as needed.  - Call for new or changing lesions.  Skin cancer screening performed today.   AK (actinic keratosis) (2) Scalp  Destruction of lesion - Scalp Complexity: simple   Destruction method: cryotherapy   Informed consent: discussed and consent obtained   Timeout:  patient name, date of birth, surgical site, and procedure verified Lesion destroyed using liquid nitrogen: Yes   Region frozen until ice ball extended beyond lesion: Yes   Outcome: patient tolerated procedure well with no complications   Post-procedure details: wound care instructions given    Tinea pedis of both feet Right Foot - Anterior  Tinea pedis and unguium  Reordered Medications terbinafine (LAMISIL) 250 MG tablet  Nummular dermatitis Mid Back  Reordered Medications tacrolimus (PROTOPIC) 0.1 % ointment  Venous stasis dermatitis of both lower extremities  Stasis dermatitis of both legs Right Lower Leg - Anterior  Reordered Medications tacrolimus (PROTOPIC) 0.1 % ointment  Skin cancer screening  Return in about 4 months (around 12/11/2019) for tinea and AK follow up with Dr. Laurence Ferrari.  I, Ashok Cordia, CMA, am acting as scribe for Sarina Ser, MD .  Documentation: I have reviewed the above documentation for accuracy and completeness, and I agree with the above.  Sarina Ser, MD

## 2019-08-17 ENCOUNTER — Encounter: Payer: Self-pay | Admitting: Dermatology

## 2019-09-25 ENCOUNTER — Other Ambulatory Visit: Payer: Self-pay

## 2019-09-25 ENCOUNTER — Emergency Department
Admission: EM | Admit: 2019-09-25 | Discharge: 2019-09-25 | Disposition: A | Discharge status: Home / Self Care | Payer: Medicare Other | Attending: Emergency Medicine | Admitting: Emergency Medicine

## 2019-09-25 DIAGNOSIS — Z87891 Personal history of nicotine dependence: Secondary | ICD-10-CM | POA: Insufficient documentation

## 2019-09-25 DIAGNOSIS — E039 Hypothyroidism, unspecified: Secondary | ICD-10-CM | POA: Diagnosis not present

## 2019-09-25 DIAGNOSIS — Z79899 Other long term (current) drug therapy: Secondary | ICD-10-CM | POA: Insufficient documentation

## 2019-09-25 DIAGNOSIS — Z96641 Presence of right artificial hip joint: Secondary | ICD-10-CM | POA: Diagnosis not present

## 2019-09-25 DIAGNOSIS — I2510 Atherosclerotic heart disease of native coronary artery without angina pectoris: Secondary | ICD-10-CM | POA: Diagnosis not present

## 2019-09-25 DIAGNOSIS — I959 Hypotension, unspecified: Secondary | ICD-10-CM | POA: Insufficient documentation

## 2019-09-25 DIAGNOSIS — Z7951 Long term (current) use of inhaled steroids: Secondary | ICD-10-CM | POA: Insufficient documentation

## 2019-09-25 DIAGNOSIS — N183 Chronic kidney disease, stage 3 unspecified: Secondary | ICD-10-CM | POA: Diagnosis not present

## 2019-09-25 DIAGNOSIS — E860 Dehydration: Secondary | ICD-10-CM | POA: Diagnosis not present

## 2019-09-25 DIAGNOSIS — Z7984 Long term (current) use of oral hypoglycemic drugs: Secondary | ICD-10-CM | POA: Diagnosis not present

## 2019-09-25 DIAGNOSIS — E119 Type 2 diabetes mellitus without complications: Secondary | ICD-10-CM | POA: Insufficient documentation

## 2019-09-25 DIAGNOSIS — R42 Dizziness and giddiness: Secondary | ICD-10-CM

## 2019-09-25 DIAGNOSIS — I129 Hypertensive chronic kidney disease with stage 1 through stage 4 chronic kidney disease, or unspecified chronic kidney disease: Secondary | ICD-10-CM | POA: Diagnosis not present

## 2019-09-25 DIAGNOSIS — Z96653 Presence of artificial knee joint, bilateral: Secondary | ICD-10-CM | POA: Insufficient documentation

## 2019-09-25 LAB — BASIC METABOLIC PANEL
Anion gap: 11 (ref 5–15)
BUN: 27 mg/dL — ABNORMAL HIGH (ref 8–23)
CO2: 21 mmol/L — ABNORMAL LOW (ref 22–32)
Calcium: 8.6 mg/dL — ABNORMAL LOW (ref 8.9–10.3)
Chloride: 110 mmol/L (ref 98–111)
Creatinine, Ser: 1.9 mg/dL — ABNORMAL HIGH (ref 0.61–1.24)
GFR calc Af Amer: 38 mL/min — ABNORMAL LOW (ref >60)
GFR calc non Af Amer: 33 mL/min — ABNORMAL LOW (ref >60)
Glucose, Bld: 154 mg/dL — ABNORMAL HIGH (ref 70–99)
Potassium: 5.1 mmol/L (ref 3.5–5.1)
Sodium: 142 mmol/L (ref 135–145)

## 2019-09-25 LAB — CBC
HCT: 35.8 % — ABNORMAL LOW (ref 39.0–52.0)
Hemoglobin: 11.7 g/dL — ABNORMAL LOW (ref 13.0–17.0)
MCH: 31.4 pg (ref 26.0–34.0)
MCHC: 32.7 g/dL (ref 30.0–36.0)
MCV: 96 fL (ref 80.0–100.0)
Platelets: 194 10*3/uL (ref 150–400)
RBC: 3.73 MIL/uL — ABNORMAL LOW (ref 4.22–5.81)
RDW: 13.6 % (ref 11.5–15.5)
WBC: 9.7 10*3/uL (ref 4.0–10.5)
nRBC: 0 % (ref 0.0–0.2)

## 2019-09-25 LAB — TROPONIN I (HIGH SENSITIVITY)
Troponin I (High Sensitivity): 8 ng/L (ref <18)
Troponin I (High Sensitivity): 10 ng/L (ref <18)

## 2019-09-25 MED ORDER — SODIUM CHLORIDE 0.9 % IV BOLUS
1000.0000 mL | Freq: Once | INTRAVENOUS | Status: AC
  Administered 2019-09-25: 1000 mL via INTRAVENOUS

## 2019-09-25 MED ORDER — SODIUM CHLORIDE 0.9% FLUSH: 3.0000 mL | Freq: Once | INTRAVENOUS | Status: DC

## 2019-09-25 NOTE — ED Provider Notes (Signed)
[facility] Emergency Department Provider Note  ____________________________________________   I have reviewed the triage vital signs and the nursing notes.   HISTORY  Chief Complaint Dizziness and Hypotension   History limited by: Not Limited   HPI [name] [name] is a 79 y.o. male who presents to the emergency department today because of episode of dizziness and weakness that occurred while the patient was outside in the yard.  Patient thinks that he got heatstroke.  He states that he was not drinking as much as she should.  Had somewhat sudden onset of dizziness.  Did in fact stumble on his way back into the house.  Patient says that his symptoms remind him when he was told he had heatstroke in the past.  Patient does have a significant heart history although denies any chest pain or palpitations.  States this does not remind him when he has had heart disease in the past.  He denies any recent illness.  States he did have a fall roughly 1 week ago when he was helping move a rug.  He stumbled backwards and his head hit a wall.  He denies any loss of consciousness at that time or significant headache.   Records reviewed. Per medical record review patient has a history of CAD, CKD, DM.   Past Medical History:  Diagnosis Date  . Anemia   . Arthritis   . CAD (coronary artery disease)   . Chronic kidney disease    CKD stage 3  . DDD (degenerative disc disease), thoracic   . Diabetes mellitus without complication ([place])   . Eczema   . GERD (gastroesophageal reflux disease)   . Heart murmur   . History of heart artery stent 2014   Per patient, [name] put in 2 stents in 2014.  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 Hyperlipidemia   . Hypertension   . Hypothyroidism   . Macular degeneration   . Myocardial infarction [facility]) 1999   Per Pt, MI in 1999.  [name] PVD (peripheral vascular disease) [facility])     Patient Active Problem List   Diagnosis  Date Noted  . Iliac artery aneurysm ([place]) 11/17/2018  . Hypertension 11/14/2017  . Hyperlipidemia 11/14/2017  . 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 Aortic atherosclerosis ([place]) 11/14/2017  . Umbilical hernia without obstruction and without gangrene   . Gallstone pancreatitis   . Acute pancreatitis 10/20/2017  . Chest pain 12/08/2015  . Bradycardia 12/08/2015  . Generalized weakness 12/08/2015  . Leukocytosis 12/08/2015  . Chronic renal insufficiency, stage 3 (moderate) 12/08/2015    Past Surgical History:  Procedure Laterality Date  . BACK SURGERY    . BICEPT TENODESIS Left 05/13/2017   Procedure: BICEPS TENODESIS;  Surgeon: [name], MD;  Location: [facility] ORS;  Service: Orthopedics;  Laterality: Left;  . CHOLECYSTECTOMY N/A 10/23/2017   Procedure: LAPAROSCOPIC CHOLECYSTECTOMY;  Surgeon: [name], MD;  Location: [facility] ORS;  Service: General;  Laterality: N/A;  . CORONARY ANGIOPLASTY    . JOINT REPLACEMENT Bilateral    knee  . JOINT REPLACEMENT Right    hip  . SHOULDER ARTHROSCOPY WITH OPEN ROTATOR CUFF REPAIR Left 05/13/2017   Procedure: SHOULDER ARTHROSCOPY WITH OPEN ROTATOR CUFF REPAIR;  Surgeon: [name], MD;  Location: [facility] ORS;  Service: Orthopedics;  Laterality: Left;  subacromial decompression, debridement  . TONSILLECTOMY    . UMBILICAL HERNIA REPAIR  10/23/2017   Procedure: HERNIA REPAIR UMBILICAL  ADULT;  Surgeon: [name], MD;  Location: [facility] ORS;  Service: General;;  . VASECTOMY      Prior to Admission medications   Medication Sig Start Date End Date Taking? Authorizing Provider  aspirin EC 81 MG tablet Take 81 mg by mouth daily.     [provider]  ciclopirox (LOPROX) 0.77 % cream use BID to bilateral feet and lower legs 04/07/19   [provider]  Cyanocobalamin (B-12) 2500 MCG TABS Take 2,500 mcg by mouth daily.    [provider]  cyclobenzaprine (FLEXERIL) 10 MG tablet Take 10 mg by mouth 3 (three) times  daily as needed for muscle spasms.  08/17/14   [provider]  furosemide (LASIX) 20 MG tablet Take 20 mg by mouth daily as needed for edema.    [provider]  gabapentin (NEURONTIN) 300 MG capsule Take 300 mg by mouth at bedtime.  05/22/15   [provider]  Insulin Glargine-Lixisenatide (SOLIQUA ) Inject into the skin daily.    [provider]  levocetirizine (XYZAL) 5 MG tablet Take 5 mg by mouth every evening.    [provider]  levothyroxine (SYNTHROID, LEVOTHROID) 50 MCG tablet Take 50 mcg by mouth daily before breakfast.    [provider]  lisinopril (PRINIVIL,ZESTRIL) 5 MG tablet Take 5 mg by mouth at bedtime.  03/28/15 10/20/17  [provider]  loperamide (IMODIUM) 2 MG capsule Take 2 mg by mouth as needed for diarrhea or loose stools.    [provider]  metFORMIN (GLUCOPHAGE) 500 MG tablet Take 500 mg by mouth 2 (two) times daily.  02/15/15   [provider]  Multiple Vitamin (MULTIVITAMIN WITH MINERALS) TABS tablet Take 1 tablet by mouth daily.    [provider]  Multiple Vitamins-Minerals (PRESERVISION AREDS 2) CAPS Take 1 capsule by mouth 2 (two) times daily.    [provider]  nitroGLYCERIN (NITROSTAT) 0.4 MG SL tablet Place 1 tablet under the tongue as needed. For chest pain 05/08/15   [provider]  nystatin (MYCOSTATIN/NYSTOP) powder Apply 1 g topically daily as needed (jock itch).    [provider]  omeprazole (PRILOSEC) 40 MG capsule Take 40 mg by mouth daily.  12/18/14   [provider]  rosuvastatin (CRESTOR) 20 MG tablet Take 20 mg by mouth at bedtime.  09/02/14   [provider]  sildenafil (REVATIO) 20 MG tablet Take 40-60 mg by mouth daily as needed (erectile dysfunction).    [provider]  tacrolimus (PROTOPIC) 0.1 % ointment Apply topically 2 (two) times daily. To affected areas on legs 08/11/19   Ralene Bathe, MD   terbinafine (LAMISIL) 250 MG tablet Take 1 tablet (250 mg total) by mouth daily. Discontinue Crestor while taking this medication. 08/11/19   Ralene Bathe, MD  triamcinolone ointment (KENALOG) 0.1 % Apply  as directed to affected area twice a day  Avoid F/G/A PRN rash on bilateral lower legs, DO NOT use on feet 02/11/19   [provider]    Allergies Other, Sulfamethoxazole, Tomato, Fish oil, and Sulfa antibiotics  Family History  Problem Relation Age of Onset  . Stroke Father   . Diabetes Paternal Grandmother     Social History Social History   Tobacco Use  . Smoking status: Former Smoker    Types: Cigarettes    Quit date: 01/04/1998    Years since quitting: 21.7  . Smokeless tobacco: Never Used  Vaping Use  . Vaping  Use: Never used  Substance Use Topics  . Alcohol use: Yes    Alcohol/week: 12.0 - 14.0 standard drinks    Types: 6 - 7 Cans of beer, 6 - 7 Standard drinks or equivalent per week  . Drug use: No    Review of Systems Constitutional: No fever/chills Eyes: No visual changes. ENT: No sore throat. Cardiovascular: Denies chest pain. Respiratory: Denies shortness of breath. Gastrointestinal: No abdominal pain.  No nausea, no vomiting.  No diarrhea.   Genitourinary: Negative for dysuria. Musculoskeletal: Negative for back pain. Skin: Positive for right leg wound. Wound to back of head. Neurological: Positive for dizziness. ____________________________________________   PHYSICAL EXAM:  VITAL SIGNS: ED Triage Vitals  Enc Vitals Group     BP 09/25/19 1520 (!) 75/52     Pulse Rate 09/25/19 1520 88     Resp 09/25/19 1520 14     Temp 09/25/19 1520 98.2 F (36.8 C)     Temp src --      SpO2 09/25/19 1520 98 %     Weight --      Height --      Head Circumference --      Peak Flow --      Pain Score 09/25/19 1522 0   Constitutional: Alert and oriented.  Eyes: Conjunctivae are normal.  ENT      Head: Normocephalic and atraumatic.      Nose:  No congestion/rhinnorhea.      Mouth/Throat: Mucous membranes are moist.      Neck: No stridor. Hematological/Lymphatic/Immunilogical: No cervical lymphadenopathy. Cardiovascular: Normal rate, regular rhythm.  No murmurs, rubs, or gallops. Respiratory: Normal respiratory effort without tachypnea nor retractions. Breath sounds are clear and equal bilaterally. No wheezes/rales/rhonchi. Gastrointestinal: Soft and non tender. No rebound. No guarding.  Genitourinary: Deferred Musculoskeletal: Normal range of motion in all extremities. No lower extremity edema. Neurologic:  Normal speech and language. No gross focal neurologic deficits are appreciated.  Skin:  Scab to left occiput.  Psychiatric: Mood and affect are normal. Speech and behavior are normal. Patient exhibits appropriate insight and judgment.  ____________________________________________    LABS (pertinent positives/negatives)  CBC wbc 9.7, hgb 11.7, plt 194 BMP na 142, k 5.1, glu 154, cr 1.90 Trop hs 8 to 10 ____________________________________________   EKG  I, Nance Pear, attending physician, personally viewed and interpreted this EKG  EKG Time: 1515 Rate: 82 Rhythm: normal sinus rhythm Axis: left axis deviation Intervals: qtc 448 QRS: narrow, q waves v1, v2 ST changes: no st elevation Impression: abnormal ekg  ____________________________________________    RADIOLOGY  None  ____________________________________________   PROCEDURES  Procedures  ____________________________________________   INITIAL IMPRESSION / ASSESSMENT AND PLAN / ED COURSE  Pertinent labs & imaging results that were available during my care of the patient were reviewed by me and considered in my medical decision making (see chart for details).   Patient presented to the emergency department today because of concern for dizziness. Found to be significantly hypotensive on EMS arrival and continued to be hypotensive at arrival.  Concern for dehydration, heat related illness, cardiac etiology, anemia amongst other causes. Patient's blood pressure did improve here in the emergency department after receiving IV fluids. Creatinine slightly elevated but appears consistent with baseline. Hgb at baseline. Troponin negative x 2. Patient did feel better after IVFs and being in the emergency department. Will plan on discharging home at this point.   ____________________________________________   FINAL CLINICAL IMPRESSION(S) / ED DIAGNOSES  Final diagnoses:  Dehydration  Lightheadedness     Note: This dictation was prepared with Dragon dictation. Any transcriptional errors that result from this process are unintentional     Nance Pear, MD 09/25/19 1845

## 2019-09-25 NOTE — ED Triage Notes (Signed)
Pt presents via EMS c/o dizziness and weakness while mowing yard. Hypotensive per EMS. 1L NS bolus given by EMS. Pt still hypotensive.

## 2019-09-25 NOTE — Discharge Instructions (Addendum)
Please seek medical attention for any high fevers, chest pain, shortness of breath, change in behavior, persistent vomiting, bloody stool or any other new or concerning symptoms.  

## 2019-11-17 ENCOUNTER — Ambulatory Visit
Admission: RE | Admit: 2019-11-17 | Discharge: 2019-11-17 | Disposition: A | Discharge status: Home / Self Care | Payer: Medicare Other | Source: Ambulatory Visit | Attending: Vascular Surgery | Admitting: Vascular Surgery

## 2019-11-17 ENCOUNTER — Other Ambulatory Visit: Payer: Self-pay

## 2019-11-17 DIAGNOSIS — I712 Thoracic aortic aneurysm, without rupture, unspecified: Secondary | ICD-10-CM

## 2019-11-23 ENCOUNTER — Ambulatory Visit (INDEPENDENT_AMBULATORY_CARE_PROVIDER_SITE_OTHER): Payer: Medicare Other | Admitting: Vascular Surgery

## 2019-11-23 ENCOUNTER — Other Ambulatory Visit: Payer: Self-pay

## 2019-11-23 ENCOUNTER — Ambulatory Visit (INDEPENDENT_AMBULATORY_CARE_PROVIDER_SITE_OTHER): Payer: Medicare Other

## 2019-11-23 DIAGNOSIS — I723 Aneurysm of iliac artery: Secondary | ICD-10-CM | POA: Diagnosis not present

## 2019-11-25 ENCOUNTER — Encounter (INDEPENDENT_AMBULATORY_CARE_PROVIDER_SITE_OTHER): Payer: Self-pay | Admitting: Vascular Surgery

## 2019-12-09 ENCOUNTER — Other Ambulatory Visit: Payer: Self-pay

## 2019-12-09 ENCOUNTER — Ambulatory Visit (INDEPENDENT_AMBULATORY_CARE_PROVIDER_SITE_OTHER): Payer: Medicare Other | Admitting: Dermatology

## 2019-12-09 DIAGNOSIS — I872 Venous insufficiency (chronic) (peripheral): Secondary | ICD-10-CM

## 2019-12-09 DIAGNOSIS — L821 Other seborrheic keratosis: Secondary | ICD-10-CM

## 2019-12-09 DIAGNOSIS — L814 Other melanin hyperpigmentation: Secondary | ICD-10-CM

## 2019-12-09 DIAGNOSIS — D485 Neoplasm of uncertain behavior of skin: Secondary | ICD-10-CM

## 2019-12-09 DIAGNOSIS — Z1283 Encounter for screening for malignant neoplasm of skin: Secondary | ICD-10-CM

## 2019-12-09 DIAGNOSIS — D1800 Hemangioma unspecified site: Secondary | ICD-10-CM

## 2019-12-09 DIAGNOSIS — Z86018 Personal history of other benign neoplasm: Secondary | ICD-10-CM

## 2019-12-09 DIAGNOSIS — B351 Tinea unguium: Secondary | ICD-10-CM | POA: Diagnosis not present

## 2019-12-09 DIAGNOSIS — D239 Other benign neoplasm of skin, unspecified: Secondary | ICD-10-CM

## 2019-12-09 DIAGNOSIS — D2371 Other benign neoplasm of skin of right lower limb, including hip: Secondary | ICD-10-CM | POA: Diagnosis not present

## 2019-12-09 DIAGNOSIS — L570 Actinic keratosis: Secondary | ICD-10-CM | POA: Diagnosis not present

## 2019-12-09 DIAGNOSIS — L578 Other skin changes due to chronic exposure to nonionizing radiation: Secondary | ICD-10-CM

## 2019-12-09 DIAGNOSIS — D229 Melanocytic nevi, unspecified: Secondary | ICD-10-CM

## 2019-12-09 HISTORY — DX: Personal history of other benign neoplasm: Z86.018

## 2019-12-09 MED ORDER — MUPIROCIN 2 % EX OINT: 1.0000 "application " | TOPICAL_OINTMENT | Freq: Every day | CUTANEOUS | 0 refills | Status: DC

## 2019-12-09 NOTE — Patient Instructions (Addendum)
Melanoma ABCDEs  Melanoma is the most dangerous type of skin cancer, and is the leading cause of death from skin disease.  You are more likely to develop melanoma if you:  Have light-colored skin, light-colored eyes, or red or blond hair  Spend a lot of time in the sun  Tan regularly, either outdoors or in a tanning bed  Have had blistering sunburns, especially during childhood  Have a close family member who has had a melanoma  Have atypical moles or large birthmarks  Early detection of melanoma is key since treatment is typically straightforward and cure rates are extremely high if we catch it early.   The first sign of melanoma is often a change in a mole or a new dark spot.  The ABCDE system is a way of remembering the signs of melanoma.  A for asymmetry:  The two halves do not match. B for border:  The edges of the growth are irregular. C for color:  A mixture of colors are present instead of an even brown color. D for diameter:  Melanomas are usually (but not always) greater than 6mm - the size of a pencil eraser. E for evolution:  The spot keeps changing in size, shape, and color.  Please check your skin once per month between visits. You can use a small mirror in front and a large mirror behind you to keep an eye on the back side or your body.   If you see any new or changing lesions before your next follow-up, please call to schedule a visit.  Please continue daily skin protection including broad spectrum sunscreen SPF 30+ to sun-exposed areas, reapplying every 2 hours as needed when you're outdoors.   Cryotherapy Aftercare  . Wash gently with soap and water everyday.   . Apply Vaseline and Band-Aid daily until healed.  Prior to procedure, discussed risks of blister formation, small wound, skin dyspigmentation, or rare scar following cryotherapy.   Wound Care Instructions  1. Cleanse wound gently with soap and water once a day then pat dry with clean gauze. Apply a  thing coat of Petrolatum (petroleum jelly, "Vaseline") over the wound (unless you have an allergy to this). We recommend that you use a new, sterile tube of Vaseline. Do not pick or remove scabs. Do not remove the yellow or white "healing tissue" from the base of the wound.  2. Cover the wound with fresh, clean, nonstick gauze and secure with paper tape. You may use Band-Aids in place of gauze and tape if the would is small enough, but would recommend trimming much of the tape off as there is often too much. Sometimes Band-Aids can irritate the skin.  3. You should call the office for your biopsy report after 1 week if you have not already been contacted.  4. If you experience any problems, such as abnormal amounts of bleeding, swelling, significant bruising, significant pain, or evidence of infection, please call the office immediately.  5. FOR ADULT SURGERY PATIENTS: If you need something for pain relief you may take 1 extra strength Tylenol (acetaminophen) AND 2 Ibuprofen (200mg each) together every 4 hours as needed for pain. (do not take these if you are allergic to them or if you have a reason you should not take them.) Typically, you may only need pain medication for 1 to 3 days.     

## 2019-12-09 NOTE — Progress Notes (Signed)
Follow-Up Visit   Subjective  [name] [name] is a 79 y.o. male who presents for the following: FBSE.  Patient here for full body skin exam and skin cancer screening. No h/o skin cancer.   The following portions of the chart were reviewed this encounter and updated as appropriate:  Tobacco  Allergies  Meds  Problems  Med Hx  Surg Hx  Fam Hx      Review of Systems:  No other skin or systemic complaints except as noted in HPI or Assessment and Plan.  Objective  Well appearing patient in no apparent distress; mood and affect are within normal limits.  A full examination was performed including scalp, head, eyes, ears, nose, lips, neck, chest, axillae, abdomen, back, buttocks, bilateral upper extremities, bilateral lower extremities, hands, feet, fingers, toes, fingernails, and toenails. All findings within normal limits unless otherwise noted below.  Objective  L 5th finger PIP x 1, R vertex scalp, mid vertex scalp x 1: Erythematous thin papules/macules with gritty scale.  Hypertrophic at left 5th finger.   Objective  Right Nose: 0.5cm erythematous papule with prominent telangiectasia R/o telangiectasia over BCC       Objective  Right calf: 0.2cm irregular dark brown macule       Objective  Right Lateral Thigh, shin: Firm pink/brown papulenodule with dimple sign.    Assessment & Plan  AK (actinic keratosis) L 5th finger PIP x 1, R vertex scalp, mid vertex scalp x 1  Prior to procedure, discussed risks of blister formation, small wound, skin dyspigmentation, or rare scar following cryotherapy.    Destruction of lesion - L 5th finger PIP x 1, R vertex scalp, mid vertex scalp x 1  Destruction method: cryotherapy   Informed consent: discussed and consent obtained   Lesion destroyed using liquid nitrogen: Yes   Cryotherapy cycles:  2 Outcome: patient tolerated procedure well with no complications   Post-procedure details: wound care instructions given      Neoplasm of uncertain behavior of skin (2) Right Nose  Skin / nail biopsy Type of biopsy: tangential   Informed consent: discussed and consent obtained   Timeout: patient name, date of birth, surgical site, and procedure verified   Patient was prepped and draped in usual sterile fashion: Area prepped with isopropyl alcohol. Anesthesia: the lesion was anesthetized in a standard fashion   Anesthetic:  1% lidocaine w/ epinephrine 1-100,000 buffered w/ 8.4% NaHCO3 Instrument used: flexible razor blade   Hemostasis achieved with: aluminum chloride   Outcome: patient tolerated procedure well   Post-procedure details: wound care instructions given   Additional details:  Mupirocin and a bandage applied  Specimen 1 - Surgical pathology Differential Diagnosis: R/o telangiectasia over BCC Check Margins: No 0.5cm erythematous papule with prominent telangiectasia   Right calf  Epidermal / dermal shaving  Lesion diameter (cm):  0.2 Informed consent: discussed and consent obtained   Timeout: patient name, date of birth, surgical site, and procedure verified   Patient was prepped and draped in usual sterile fashion: area prepped with isopropyl alcohol. Anesthesia: the lesion was anesthetized in a standard fashion   Anesthetic:  1% lidocaine w/ epinephrine 1-100,000 buffered w/ 8.4% NaHCO3 Instrument used: flexible razor blade   Hemostasis achieved with: aluminum chloride   Outcome: patient tolerated procedure well   Post-procedure details: wound care instructions given   Additional details:  Mupirocin and a bandage applied  Specimen 2 - Surgical pathology Differential Diagnosis: r/o Atypia Check Margins: No 0.2cm irregular dark brown  macule  Start mupirocin daily with dressing change  Ordered Medications: mupirocin ointment (BACTROBAN) 2 %  Dermatofibroma Right Lateral Thigh, shin  Benign-appearing.  Observation.  Call clinic for new or changing lesions.  Recommend daily use of  broad spectrum spf 30+ sunscreen to sun-exposed areas.    Venous stasis dermatitis of both lower extremities bilateral lower legs Chronic, well controlled Continue with compression stockings Continue tacrolimus ointment BID prn  Onychomycosis bilateral feet - Observe. - Continue ciclopirox once weekly to feet to prevent recurrence of tinea pedis.  Lentigines - Scattered tan macules - Discussed due to sun exposure - Benign, observe - Call for any changes  Seborrheic Keratoses - Stuck-on, waxy, tan-brown papules and plaques  - Discussed benign etiology and prognosis. - Observe - Call for any changes  Melanocytic Nevi - Tan-brown and/or pink-flesh-colored symmetric macules and papules - Benign appearing on exam today - Observation - Call clinic for new or changing moles - Recommend daily use of broad spectrum spf 30+ sunscreen to sun-exposed areas.   Hemangiomas - Red papules - Discussed benign nature - Observe - Call for any changes  Actinic Damage - Chronic, stable - diffuse scaly erythematous macules with underlying dyspigmentation - Recommend daily broad spectrum sunscreen SPF 30+ to sun-exposed areas, reapply every 2 hours as needed.  - Call for new or changing lesions.  Skin cancer screening performed today.   Return in about 3 months (around 03/10/2020) for AK follow up, feet and legs.  Graciella Belton, RMA, am acting as scribe for Forest Gleason, MD .  Documentation: I have reviewed the above documentation for accuracy and completeness, and I agree with the above.  Forest Gleason, MD

## 2019-12-15 ENCOUNTER — Telehealth: Payer: Self-pay

## 2019-12-15 NOTE — Telephone Encounter (Signed)
-----   Message from Alfonso Patten, MD sent at 12/15/2019  9:01 AM EDT ----- 1. 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 On the spectrum from normal mole to melanoma skin cancer, this is in between the two. - We need to recheck this area sometime in the next 6 months to be sure there is no evidence of the atypical mole coming back. If there is any color coming back, we would recommend repeating the biopsy to be sure the cells look normal.  - People who have a history of atypical moles do have a slightly increased risk of developing melanoma somewhere on the body, so a yearly full body skin exam by a dermatologist is recommended.  - Monthly self skin checks and daily sun protection are also recommended.  - Please call if you notice a dark spot coming back where this biopsy was taken.  - Please also call if you notice any new or changing spots anywhere else on the body before your follow-up visit.   MAs please call

## 2019-12-15 NOTE — Telephone Encounter (Signed)
Patient advised bx results. Right nose, dilated blood vessel and right calf moderate dysplastic. Keep appt in January, JS

## 2019-12-15 NOTE — Progress Notes (Signed)
1. 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 On the spectrum from normal mole to melanoma skin cancer, this is in between the two. - We need to recheck this area sometime in the next 6 months to be sure there is no evidence of the atypical mole coming back. If there is any color coming back, we would recommend repeating the biopsy to be sure the cells look normal.  - People who have a history of atypical moles do have a slightly increased risk of developing melanoma somewhere on the body, so a yearly full body skin exam by a dermatologist is recommended.  - Monthly self skin checks and daily sun protection are also recommended.  - Please call if you notice a dark spot coming back where this biopsy was taken.  - Please also call if you notice any new or changing spots anywhere else on the body before your follow-up visit.   MAs please call

## 2019-12-28 ENCOUNTER — Encounter: Payer: Self-pay | Admitting: Dermatology

## 2020-03-07 ENCOUNTER — Telehealth: Payer: Self-pay | Admitting: Infectious Diseases

## 2020-03-07 NOTE — Telephone Encounter (Signed)
Called to discuss with patient about COVID-19 symptoms and the use of one of the available treatments for those with mild to moderate Covid symptoms and at a high risk of hospitalization.  Pt appears to qualify for outpatient treatment due to co-morbid conditions and/or a member of an at-risk group in accordance with the FDA Emergency Use Authorization.    Symptom onset: 12/27 (day 8) Vaccinated: vaccinated Pfizer last dose February 2021 Qualifiers: diabetes, HTN, Age > 38  Unable to reach pt - I left him a VM verifying a referral was received from [facility] but being he is day 8 on symptoms we don't have the supply to offer him treatment based on chart review. He does not have any immunosuppressing conditions or medications. I asked him to call back my work cell if he is having worsening symptoms so we could take careful review of his situation.   Rexene Alberts

## 2020-03-16 ENCOUNTER — Encounter: Payer: Self-pay | Admitting: Dermatology

## 2020-03-16 ENCOUNTER — Ambulatory Visit (INDEPENDENT_AMBULATORY_CARE_PROVIDER_SITE_OTHER): Payer: Medicare Other | Admitting: Dermatology

## 2020-03-16 ENCOUNTER — Other Ambulatory Visit: Payer: Self-pay

## 2020-03-16 DIAGNOSIS — L570 Actinic keratosis: Secondary | ICD-10-CM | POA: Diagnosis not present

## 2020-03-16 DIAGNOSIS — I872 Venous insufficiency (chronic) (peripheral): Secondary | ICD-10-CM

## 2020-03-16 DIAGNOSIS — L578 Other skin changes due to chronic exposure to nonionizing radiation: Secondary | ICD-10-CM | POA: Diagnosis not present

## 2020-03-16 MED ORDER — MUPIROCIN 2 % EX OINT: 1.0000 "application " | TOPICAL_OINTMENT | Freq: Every day | CUTANEOUS | 1 refills | Status: AC

## 2020-03-16 NOTE — Patient Instructions (Addendum)
Recommend Dove for sensitive skin or Lever 2000. Apply vaseline or heavy moisturizer daily.  Cryotherapy Aftercare  . Wash gently with soap and water everyday.   [name] Apply Vaseline and Band-Aid daily until healed.  Prior to procedure, discussed risks of blister formation, small wound, skin dyspigmentation, or rare scar following cryotherapy.

## 2020-03-16 NOTE — Progress Notes (Signed)
   Follow-Up Visit   Subjective  [name] [name] is a 80 y.o. male who presents for the following: Follow-up (Patient here today for 3 month AK and stasis dermatitis follow up. He is using tacrolimus but not consistently and wearing compression socks. Advises his legs are not that good. Patient is not aware of any new or changing spots. ).  Patient advises he will use tacrolimus about 4-5 days then once improved he stops using and restarts when worsens.   The following portions of the chart were reviewed this encounter and updated as appropriate:   Tobacco  Allergies  Meds  Problems  Med Hx  Surg Hx  Fam Hx      Review of Systems:  No other skin or systemic complaints except as noted in HPI or Assessment and Plan.  Objective  Well appearing patient in no apparent distress; mood and affect are within normal limits.  A focused examination was performed including lower legs, arms, hands, scalp. Relevant physical exam findings are noted in the Assessment and Plan.  Objective  Bilateral Lower Legs: Scaly pink plaques bilateral with erosion at left pretibial.   Objective  Left 2nd finger x 1, vertex scalp x 3, R 2nd finger x 3 (7): Erythematous thin papules/macules with gritty scale.    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 No cure, only control. Condition is bothersome to patient. Not currently at goal. He has not been using tacrolimus daily but does use 5-6 days at a time. He notes it keeps getting worse again.   Continue tacrolimus increasing to daily to twice a day to lower legs. Will recheck in 2 weeks to assess response with consistent use and then determine how to proceed. Continue compression stockings 20-30 mmHg daily.  Recommend Dove for sensitive skin or Lever 2000 and vaseline or heavy moisturizer daily.   Start mupirocin daily to erosion at left pretibial.  Discussed adding topical  steroid and/or treating with Unna boot if not well-controlled at follow-up in 2 weeks.  Ordered Medications: mupirocin ointment (BACTROBAN) 2 %  Other Related Medications tacrolimus (PROTOPIC) 0.1 % ointment  AK (actinic keratosis) (7) Left 2nd finger x 1, vertex scalp x 3, R 2nd finger x 3  Prior to procedure, discussed risks of blister formation, small wound, skin dyspigmentation, or rare scar following cryotherapy.    Destruction of lesion - Left 2nd finger x 1, vertex scalp x 3, R 2nd finger x 3 Complexity: simple   Destruction method: cryotherapy   Informed consent: discussed and consent obtained   Lesion destroyed using liquid nitrogen: Yes   Cryotherapy cycles:  2 Outcome: patient tolerated procedure well with no complications   Post-procedure details: wound care instructions given    Actinic Damage - chronic, secondary to cumulative UV radiation exposure/sun exposure over time - diffuse scaly erythematous macules with underlying dyspigmentation - Recommend daily broad spectrum sunscreen SPF 30+ to sun-exposed areas, reapply every 2 hours as needed.  - Call for new or changing lesions.  Return in about 2 weeks (around 03/30/2020) for stasis dermatitis.  I, Margarette Asal, RMA, scribed for Alfonso Patten, MD.  Documentation: I have reviewed the above documentation for accuracy and completeness, and I agree with the above.  Forest Gleason, MD

## 2020-03-30 ENCOUNTER — Other Ambulatory Visit: Payer: Self-pay | Admitting: Internal Medicine

## 2020-03-30 ENCOUNTER — Other Ambulatory Visit: Payer: Self-pay

## 2020-03-30 ENCOUNTER — Ambulatory Visit (INDEPENDENT_AMBULATORY_CARE_PROVIDER_SITE_OTHER): Payer: Medicare Other | Admitting: Dermatology

## 2020-03-30 DIAGNOSIS — I872 Venous insufficiency (chronic) (peripheral): Secondary | ICD-10-CM | POA: Diagnosis not present

## 2020-03-30 DIAGNOSIS — I890 Lymphedema, not elsewhere classified: Secondary | ICD-10-CM | POA: Diagnosis not present

## 2020-03-30 DIAGNOSIS — N1831 Chronic kidney disease, stage 3a: Secondary | ICD-10-CM

## 2020-03-30 NOTE — Progress Notes (Signed)
   Follow-Up Visit   Subjective  [name] [name] is a 80 y.o. male who presents for the following: 2 week follow up (Patient here today for 2 week follow up for stasis dermatitis on both legs. He states he has concerns with taking compressing stocking off. Patient states legs feel good today. ).  The following portions of the chart were reviewed this encounter and updated as appropriate:      Objective  Well appearing patient in no apparent distress; mood and affect are within normal limits.  A focused examination was performed including bilateral legs . Relevant physical exam findings are noted in the Assessment and Plan.  Objective  Left Lower Leg - Anterior: Pink plaques  Assessment & Plan  Stasis dermatitis of both legs Left Lower Leg - Anterior  With permanent skin changes from lymphedema.  Chronic condition with duration over one year. Currently well-controlled.  Continue using compression stockings daily  Continue using mupiricion on open areas   Use tacrolimus ointment twice a day as needed to inflamed areas  Other Related Medications tacrolimus (PROTOPIC) 0.1 % ointment mupirocin ointment (BACTROBAN) 2 %  Return in about 3 months (around 06/28/2020) for follow on stasis dermatitis , ak check .  I, Ruthell Rummage, CMA, am acting as scribe for Forest Gleason, MD.  Documentation: I have reviewed the above documentation for accuracy and completeness, and I agree with the above.  Forest Gleason, MD

## 2020-04-07 ENCOUNTER — Ambulatory Visit
Admission: RE | Admit: 2020-04-07 | Discharge: 2020-04-07 | Disposition: A | Discharge status: Home / Self Care | Payer: Medicare Other | Source: Ambulatory Visit | Attending: Internal Medicine | Admitting: Internal Medicine

## 2020-04-07 ENCOUNTER — Other Ambulatory Visit: Payer: Self-pay

## 2020-04-07 DIAGNOSIS — N1831 Chronic kidney disease, stage 3a: Secondary | ICD-10-CM | POA: Insufficient documentation

## 2020-05-01 ENCOUNTER — Telehealth: Payer: Self-pay

## 2020-05-01 NOTE — Telephone Encounter (Signed)
He previously failed triamcinolone. Are you saying they will only cover tacrolimus for atopic dermatitis? What non-steroid do they say they cover for stasis dermatitis? If none, we'll need to do an appeal. Thank you

## 2020-05-01 NOTE — Telephone Encounter (Signed)
Tacrolimus denied by insurance due to not being used for atopic dermatitis specifically. They are also requiring previous use of a topical corticosteroid.

## 2020-06-29 ENCOUNTER — Ambulatory Visit (INDEPENDENT_AMBULATORY_CARE_PROVIDER_SITE_OTHER): Payer: Medicare Other | Admitting: Dermatology

## 2020-06-29 ENCOUNTER — Encounter: Payer: Self-pay | Admitting: Dermatology

## 2020-06-29 ENCOUNTER — Other Ambulatory Visit: Payer: Self-pay

## 2020-06-29 DIAGNOSIS — I872 Venous insufficiency (chronic) (peripheral): Secondary | ICD-10-CM

## 2020-06-29 DIAGNOSIS — Z86018 Personal history of other benign neoplasm: Secondary | ICD-10-CM

## 2020-06-29 NOTE — Patient Instructions (Signed)
If you have any questions or concerns for your doctor, please call our main line at [phone] and press option 4 to reach your doctor's medical assistant. If no one answers, please leave a voicemail as directed and we will return your call as soon as possible. Messages left after 4 pm will be answered the following business day.   You may also send [place] a message via [place]. We typically respond to MyChart messages within 1-2 business days.  For prescription refills, please ask your pharmacy to contact our office. Our fax number is [phone].  If you have an urgent issue when the clinic is closed that cannot wait until the next business day, you can page your doctor at the number below.    Please note that while we do our best to be available for urgent issues outside of office hours, we are not available 24/7.   If you have an urgent issue and are unable to reach [place], you may choose to seek medical care at your doctor's office, retail clinic, urgent care center, or emergency room.  If you have a medical emergency, please immediately call 911 or go to the emergency department.  Pager Numbers  - Dr. [name]: [phone]  - Dr. Laurence Ferrari: [phone]  - Dr. Nicole Kindred: [phone]  In the event of inclement weather, please call our main line at [phone] for an update on the status of any delays or closures.  Dermatology Medication Tips: Please keep the boxes that topical medications come in in order to help keep track of the instructions about where and how to use these. Pharmacies typically print the medication instructions only on the boxes and not directly on the medication tubes.   If your medication is too expensive, please contact our office at [phone] option 4 or send [place] a message through [place].   We are unable to tell what your co-pay for medications will be in advance as this is different depending on your insurance coverage. However, we may be able to find a substitute  medication at lower cost or fill out paperwork to get insurance to cover a needed medication.   If a prior authorization is required to get your medication covered by your insurance company, please allow [place] 1-2 business days to complete this process.  Drug prices often vary depending on where the prescription is filled and some pharmacies may offer cheaper prices.  The website www.goodrx.com contains coupons for medications through different pharmacies. The prices here do not account for what the cost may be with help from insurance (it may be cheaper with your insurance), but the website can give you the price if you did not use any insurance.  - You can print the associated coupon and take it with your prescription to the pharmacy.  - You may also stop by our office during regular business hours and pick up a GoodRx coupon card.  - If you need your prescription sent electronically to a different pharmacy, notify our office through [facility] or by phone at [phone] option 4.  Recommend daily broad spectrum sunscreen SPF 30+ to sun-exposed areas, reapply every 2 hours as needed. Call for new or changing lesions.  Staying in the shade or wearing long sleeves, sun glasses (UVA+UVB protection) and wide brim hats (4-inch brim around the entire circumference of the hat) are also recommended for sun protection.   Recommend taking Heliocare sun protection supplement daily in sunny weather for additional sun protection. For maximum protection on the  sunniest days, you can take up to 2 capsules of regular Heliocare OR take 1 capsule of Heliocare Ultra. For prolonged exposure (such as a full day in the sun), you can repeat your dose of the supplement 4 hours after your first dose. Heliocare can be purchased at [facility] or at [email or web address].

## 2020-06-29 NOTE — Progress Notes (Signed)
   Follow-Up Visit   Subjective  [name] [name] is a 80 y.o. male who presents for the following: Follow-up (Patient here today for 3 month stasis dermatitis follow up. He is using tacrolimus once daily and wearing compression stockings. Patient advises his legs are about the same. ).  Patient advises legs are not itchy or tender.   The following portions of the chart were reviewed this encounter and updated as appropriate:   Tobacco  Allergies  Meds  Problems  Med Hx  Surg Hx  Fam Hx      Review of Systems:  No other skin or systemic complaints except as noted in HPI or Assessment and Plan.  Objective  Well appearing patient in no apparent distress; mood and affect are within normal limits.  A focused examination was performed including lower legs. Relevant physical exam findings are noted in the Assessment and Plan.  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 Scarring/permanent skin changes present today. Adequate control.  D/c tacrolimus and restart as needed for any itch or tenderness Continue compression daily  History of Dysplastic Nevi - No evidence of recurrence today at right calf (moderate atypia) - Recommend regular full body skin exams - Recommend daily broad spectrum sunscreen SPF 30+ to sun-exposed areas, reapply every 2 hours as needed.  - Call if any new or changing lesions are noted between office visits  Return in about 6 months (around 12/29/2020) for TBSE.  Graciella Belton, RMA, am acting as scribe for Forest Gleason, MD .   Documentation: I have reviewed the above documentation for accuracy and completeness, and I agree with the above.  Forest Gleason, MD

## 2020-08-21 ENCOUNTER — Other Ambulatory Visit: Payer: Self-pay | Admitting: Dermatology

## 2020-08-21 DIAGNOSIS — L300 Nummular dermatitis: Secondary | ICD-10-CM

## 2020-08-21 DIAGNOSIS — I872 Venous insufficiency (chronic) (peripheral): Secondary | ICD-10-CM

## 2020-11-01 ENCOUNTER — Other Ambulatory Visit (INDEPENDENT_AMBULATORY_CARE_PROVIDER_SITE_OTHER): Payer: Self-pay | Admitting: Vascular Surgery

## 2020-11-01 DIAGNOSIS — I714 Abdominal aortic aneurysm, without rupture, unspecified: Secondary | ICD-10-CM

## 2020-11-03 ENCOUNTER — Ambulatory Visit (INDEPENDENT_AMBULATORY_CARE_PROVIDER_SITE_OTHER): Payer: Medicare Other | Admitting: Vascular Surgery

## 2020-11-03 ENCOUNTER — Ambulatory Visit (INDEPENDENT_AMBULATORY_CARE_PROVIDER_SITE_OTHER): Payer: Medicare Other

## 2020-11-03 ENCOUNTER — Other Ambulatory Visit: Payer: Self-pay

## 2020-11-03 DIAGNOSIS — I714 Abdominal aortic aneurysm, without rupture, unspecified: Secondary | ICD-10-CM

## 2020-11-15 ENCOUNTER — Encounter (INDEPENDENT_AMBULATORY_CARE_PROVIDER_SITE_OTHER): Payer: Self-pay | Admitting: *Deleted

## 2020-12-04 ENCOUNTER — Ambulatory Visit: Payer: Medicare Other | Attending: Orthopedic Surgery | Admitting: Physical Therapy

## 2020-12-04 DIAGNOSIS — M545 Low back pain, unspecified: Secondary | ICD-10-CM | POA: Insufficient documentation

## 2020-12-04 DIAGNOSIS — M25551 Pain in right hip: Secondary | ICD-10-CM | POA: Diagnosis not present

## 2020-12-04 DIAGNOSIS — G8929 Other chronic pain: Secondary | ICD-10-CM | POA: Diagnosis present

## 2020-12-04 DIAGNOSIS — M6281 Muscle weakness (generalized): Secondary | ICD-10-CM | POA: Diagnosis present

## 2020-12-04 NOTE — Therapy (Signed)
[facility] [address], [place], [address] Phone: [phone]   Fax:  [phone]  Physical Therapy Evaluation  Patient Details  Name: Ester Mabe MRN: [id number] Date of Birth: [birth year] Referring Provider (PT): Lauris Poag, MD  Encounter Date: 12/04/2020   PT End of Session - 12/04/20 1649     Visit Number 1    Number of Visits 24    Date for PT Re-Evaluation 02/26/21    Authorization Type MEDICARE PART B reporting period from 12/04/2020    Progress Note Due on Visit 10    PT Start Time 1515    PT Stop Time 1609    PT Time Calculation (min) 54 min    Activity Tolerance Patient tolerated treatment well    Behavior During Therapy [facility] for tasks assessed/performed             Past Medical History:  Diagnosis Date   Anemia    Arthritis    CAD (coronary artery disease)    Chronic kidney disease    CKD stage 3   DDD (degenerative disc disease), thoracic    Diabetes mellitus without complication (HCC)    Eczema    GERD (gastroesophageal reflux disease)    Heart murmur    History of dysplastic nevus 12/09/2019   right calf/moderate   History of heart artery stent 2014   Per patient, [name] put in 2 stents in 2014.   History of MRSA infection 02/2013   groin, after PTCA   Hyperlipidemia    Hypertension    Hypothyroidism    Macular degeneration    Myocardial infarction ([place]) 1999   Per Pt, MI in 1999.   PVD (peripheral vascular disease) [facility])     Past Surgical History:  Procedure Laterality Date   BACK SURGERY     BICEPT TENODESIS Left 05/13/2017   Procedure: BICEPS TENODESIS;  Surgeon: [name], MD;  Location: [facility] ORS;  Service: Orthopedics;  Laterality: Left;   CHOLECYSTECTOMY N/A 10/23/2017   Procedure: LAPAROSCOPIC CHOLECYSTECTOMY;  Surgeon: [name], MD;  Location: [facility] ORS;  Service: General;  Laterality: N/A;   CORONARY ANGIOPLASTY     JOINT REPLACEMENT  Bilateral    knee   JOINT REPLACEMENT Right    hip   SHOULDER ARTHROSCOPY WITH OPEN ROTATOR CUFF REPAIR Left 05/13/2017   Procedure: SHOULDER ARTHROSCOPY WITH OPEN ROTATOR CUFF REPAIR;  Surgeon: [name], MD;  Location: [facility] ORS;  Service: Orthopedics;  Laterality: Left;  subacromial decompression, debridement   TONSILLECTOMY     UMBILICAL HERNIA REPAIR  10/23/2017   Procedure: HERNIA REPAIR UMBILICAL ADULT;  Surgeon: [name], MD;  Location: [facility] ORS;  Service: General;;   VASECTOMY      There were no vitals filed for this visit.    Subjective Assessment - 12/04/20 1523     Subjective Patient states he thinks about a couple of months ago he stated having pains in the side of his right hip and leg. Denies pain in left hip/leg. Reports he has had a right THA in 2009 and has had B TKA. A couple of months go he started having pain especially if he has been driving for a couple of hours. Feels like his right leg goes numb almost like there is pressure on his leg. About 2 weeks ago, he is not sure what he did but he started getting some deep pain in his right groin. He thought he  may have damaged his hip replacement so he went to see Dr. Sherley Bounds who did xrays that showed his hip is in good shape. States the only thing he can think of is that he pulled some muscles. He remembers no MOI for either pain onset. States his pain is staying about the same. Usually sleeps on side or stomach. Pt reports he has a history of surgery for spinal stenosis (~ 8-10 years ago, neurosurgeon "repaired a couple of slipped discs and ground out the center of the vertebrae). Prior to that surgery he had pain down both legs, unsure if one was worse. Surgery was successful. He says the lateral hip pain does remind him some of the pain he had prior to the back surgery. Back has been hurting a bit more lately too. Pain is across the back and does not correlate with his hip pain. He notices back pain when working bent over  and preparing meals. Exercises 2x a week (1 hour SAIL class) at [facility].    Pertinent History Patient is a 80 y.o. 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 This patient's chief complaints consist of intermittent right lateral hip and thigh pain and intermittent right groin pain leading to the following functional deficits: difficulty sleeping, prolonged sitting, driving/traveling, sitting up, getting in/out of a car, bed mobility, turning over in bed, golfing.   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 [name]), diabetes with neuropathy, chronic renal insufficiency, stage 3,  cholecystectomy, CAD, GERD, HTN, hypothyroidism, PVD, B knee TKA, R hip THA (2009), L RTC repair (2019), back surgery.  Patient denies hx of cancer, stroke, seizures, lung problem,  unexplained weight loss, changes in bowel or bladder problems, new onset stumbling or dropping things, neck surgery.    Limitations Sitting;Walking;House hold activities    How long can you walk comfortably? 20 min    Diagnostic tests X-ray Impression 11/22/2020: "Stable appearance to prior right total hip"    Patient Stated Goals "no pain"    Currently in Pain? No/denies    Pain Score 0-No pain   W: 4-5/10; B: 0/10   Pain Location Hip    Pain Orientation Right   lateral hip and groin, not at the same time   Pain Descriptors / Indicators Numbness;Sharp;Other (Comment)   groin: sharp; lateral: gradually building pain, numbness, pressure   Pain Type Acute pain    Pain Radiating Towards from lateral hip to toes (stays mostly lateral and anterior when above knee, and lateral below knee).    Pain Onset More than a month ago    Pain Frequency  Intermittent    Aggravating Factors  lateral hip: driving, prolonged sitting, sleeping on right side (15 min). , sitting in certain positions, walking more than 20 min; Groin pain: only with raising R leg (hip flexion) or bending forward and when straightening back up, trying to get up. active hip flexion.    Pain Relieving Factors lateral hip pain: turn over to left side and brings right hip into flexion, stop doing the things that hurt; groin pain: stop doing the agg activities.    Effect of Pain on Daily Activities Functional Limitations: sleeping, prolonged sitting, driving/traveling, sitting up, getting in/out of a car, bed mobility, turning over  in bed, golfing, walking.               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    Referring Provider (PT) Lauris Poag, MD    Onset Date/Surgical Date --   about 2 months ago for lateral hip, 2 weeks ago for groin   Prior Therapy none for this problem prior to current episode of care      Precautions   Precautions Fall      Balance Screen   Has the patient fallen in the past 6 months No    Has the patient had a decrease in activity level because of a fear of falling?  No    Is the patient reluctant to leave their home because of a fear of falling?  No      Home Environment   Living Environment --   no concners about getting around living environment safelty     Prior Function   Level of Independence Independent    Vocation Retired   Retail banker work on [employer]    Leisure whole lot of things: golf,      Cognition   Overall Cognitive Status Within Functional Limits for tasks assessed              OBJECTIVE  SELF- REPORTED FUNCTION FOTO score: 72/100 (hip questionnaire)  OBSERVATION/INSPECTION Posture Posture (seated): forward head, rounded  shoulders,  Posture (standing): decreased lumbar lordosis Anthropometrics Tremor: slight head tremor at rest Body composition: overweight, increased abdominal adiposity Muscle bulk: no significant asymmetry or abnormalities Edema: wears bilateral compression socks Functional Mobility Bed mobility: Mod I with difficulty with supine <> sit and rolling due to pain with compression to R hip and/or active hip flexion.  Transfers: sit <> stand I Gait: grossly WFL for household and short community ambulation. More detailed gait analysis deferred to later date as needed.    SPINE MOTION Lumbar Spine AROM *Indicates pain Flexion: mid shins, no concordant pain.  Extension: WFL, no concordant pain  Side Flexion:   R WFL with slight lateral hip pain  L WFL Rotation:  R WFL L WFL  NEUROLOGICAL Upper Motor Neuron Screen Hoffman's and Clonus (ankle) negative bilaterally.  Dermatomes L2-S2 appears equal and intact to light touch except the following:  Deep Tendon Reflexes R/L  2+/2+ Quadriceps reflex (L4) 0+/0+ Achilles reflex (S1)  PERIPHERAL JOINT MOTION (in degrees) Passive Range of Motion (PROM) B LE WFL except the following: Lacks L hip extension to about 15 degrees and R hip extension about 10 degrees. Concordant lateral hip pain with end range hip ER and IR at 90 degrees flexion.   MUSCLE PERFORMANCE (MMT):  *Indicates pain 12/04/20 Date Date  Joint/Motion R/L R/L R/L  Hip     Flexion (L1, L2) 2*/4 / /  Abduction 4/4 / /  Adduction 4-*/4 / /  External rotation (at 90) 3+/4 / /  Internal rotation  / / /  Knee     Extension (L3) 5/5 / /  Flexion (S2) 5/5 / /  Ankle/Foot     Dorsiflexion (L4) 5/5 / /  Great toe extension (L5) 5/5 / /  Eversion (S1) 5/5 / /  Plantarflexion (S1) 5/5 / /  Comments:  12/04/20: R hip flexion and adduction produces pain in groin. Struggles to flex  R hip due to pain.   SPECIAL TESTS: NEURODYNAMIC TESTING Straight Leg Raise (Sciatic nerve)  R   = tightness in posterior thigh, negative for concordant pain  L  = tightness in posterior thigh, negative for concordant pain  HIP SPECIAL TESTS FABER: R = positive for concordant groin pain, L = negative. FADDIR: R = positive for concordant groin pain,  ER Derotation test: R = negative,  L = negative. Ober's: R = negative  ACCESSORY MOTION:  CPA from T8-L5 and sacrum negative for concordant pain. Hypomobile throughout.   PALPATION: TTP just proximal and posterior to R greater trochanter and at R TFL  Objective measurements completed on examination: See above findings.    TREATMENT:   Therapeutic exercise: to centralize symptoms and improve ROM, strength, muscular endurance, and activity tolerance required for successful completion of functional activities.  - hooklying bridge 1x5 - Education on diagnosis, prognosis, POC, anatomy and physiology of current condition.  - Education on HEP including handout   Pt required multimodal cuing for proper technique and to facilitate improved neuromuscular control, strength, range of motion, and functional ability resulting in improved performance and form.   HOME EXERCISE PROGRAM Access Code: [email or web address] URL: [email or web address] Date: 12/04/2020 Prepared by: Rosita Kea  Exercises Bridge - 1 x daily - 1 sets - 20 reps - 5 seconds hold   PT Education - 12/04/20 1649     Education Details Exercise purpose/form. Self management techniques. Education on diagnosis, prognosis, POC, anatomy and physiology of current condition Education on HEP including handout    Person(s) Educated Patient    Methods Explanation;Demonstration;Handout;Verbal cues    Comprehension Verbalized understanding;Need further instruction;Returned demonstration;Verbal cues required              PT Short Term Goals - 12/05/20 1327       PT SHORT TERM GOAL #1   Title Be independent with initial home exercise program for self-management of  symptoms.    Baseline Initial HEP provided at visit 1 (12/04/2020);    Time 2    Period Weeks    Status New    Target Date 12/19/20               PT Long Term Goals - 12/05/20 1328       PT LONG TERM GOAL #1   Title Be independent with a long-term home exercise program for self-management of symptoms.    Baseline Initial HEP provided at visit 1 (12/04/2020);    Time 12    Period Weeks    Status New   TARGET DATE FOR ALL LONG TERM GOALS: 02/26/2021     PT LONG TERM GOAL #2   Title Demonstrate improved FOTO to equal or greater than 76 by visit #10 to demonstrate improvement in overall condition and self-reported functional ability.    Baseline 72 (12/04/2020);    Time 12    Period Weeks    Status New      PT LONG TERM GOAL #3   Title Improve B hip strength to equal or greater than 4+/5 with no increase in pain to improve patient's ability to complete functional tasks such as dressing, bed mobility, traveling with less difficulty.    Baseline R hip flexion 2/5 (12/04/2020);    Time 12    Period Weeks    Status New      PT LONG TERM GOAL #4   Title Reduce pain with functional activities to equal or less than 1/10 to allow patient  to complete usual activities including dressing, walking, traveling with less difficulty.    Baseline worst 5/10 (12/04/2020);    Time 12    Period Weeks    Status New      PT LONG TERM GOAL #5   Title Complete community, work and/or recreational activities without limitation due to current condition.    Baseline Functional Limitations: sleeping, prolonged sitting, driving/traveling, sitting up, getting in/out of a car, bed mobility, turning over in bed, golfing, walking, dressing (12/04/2020);    Time 12    Period Weeks    Status New                    Plan - 12/05/20 1324     Clinical Impression Statement Patient is a 80 y.o. 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 Patient has history of low back pain with sciatica and symptoms could be contributed form the low back but no connection was found during today's examination. Groin pain and positive special tests often suggest hip OA or intra-articular problem but utility is unknown for someone with an artificial hip. Pain with active hip flexion suggests contractile tissue. Patient presents with significant pain, joint stiffness, ROM, muscle performance (strength/power/endurance) and activity tolerance impairments that are limiting ability to complete his usual activities such as sleeping, prolonged sitting, driving/traveling, sitting up, getting in/out of a car, bed mobility, turning over in bed, golfing, walking without difficulty. Patient will benefit from skilled physical therapy intervention to address current body structure impairments and activity limitations to improve function and work towards goals set in current POC in order to return to prior level of function or maximal functional improvement.    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 [name]), diabetes with neuropathy, chronic renal insufficiency, stage 3,  cholecystectomy, CAD, GERD, HTN, hypothyroidism, PVD, B knee TKA, R hip THA (2009), L RTC repair (2019), back surgery    Examination-Activity Limitations Bed Mobility;Stairs;Sleep;Sit;Dressing;Locomotion Level;Bend    Examination-Participation Restrictions Community Activity;Driving;Interpersonal Relationship   sleeping, prolonged sitting, driving/traveling, sitting up, getting in/out of a car, bed mobility, turning over in bed,  golfing, walking.   Stability/Clinical Decision Making Stable/Uncomplicated    Clinical Decision Making Low    Rehab Potential Good    PT Frequency 2x / week    PT Duration 12 weeks    PT Treatment/Interventions ADLs/Self Care Home Management;Cryotherapy;Moist Heat;Electrical Stimulation;DME Instruction;Gait training;Stair training;Functional mobility training;Therapeutic activities;Therapeutic exercise;Balance training;Neuromuscular re-education;Manual techniques;Dry needling;Joint Manipulations;Spinal Manipulations    PT Next Visit Plan update HEP, strengthening as tolerated, manual as appropriate    PT [place] ?Access Code: [email or web address]    [email or web address] and Agree with Plan of Care Patient             Patient will benefit from skilled therapeutic intervention in order to improve the following deficits and impairments:  Improper body mechanics, Pain, Decreased mobility, Increased muscle spasms, Decreased activity tolerance, Decreased endurance, Decreased range of motion, Decreased strength, Hypomobility, Impaired perceived functional ability, Difficulty walking  Visit Diagnosis: Pain in right hip  Chronic bilateral low back pain, unspecified whether sciatica present  Muscle weakness (generalized)  Problem List Patient Active Problem List   Diagnosis Date Noted   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 Graylon Good, PT, DPT 12/05/20, 1:34 PM   [place] [facility] [address], [place], [address] Phone: [phone]   Fax:  [phone]  Name: Aayush Gelpi MRN: [id number] Date of Birth: [birth year]

## 2020-12-05 ENCOUNTER — Encounter: Payer: Self-pay | Admitting: Physical Therapy

## 2020-12-06 ENCOUNTER — Ambulatory Visit: Payer: Medicare Other | Admitting: Physical Therapy

## 2020-12-06 ENCOUNTER — Encounter: Payer: Self-pay | Admitting: Physical Therapy

## 2020-12-06 DIAGNOSIS — G8929 Other chronic pain: Secondary | ICD-10-CM

## 2020-12-06 DIAGNOSIS — M25551 Pain in right hip: Secondary | ICD-10-CM

## 2020-12-06 DIAGNOSIS — M6281 Muscle weakness (generalized): Secondary | ICD-10-CM

## 2020-12-06 NOTE — Therapy (Signed)
[facility] [address], [place], [address] Phone: [phone]   Fax:  [phone]  Physical Therapy Treatment  Patient Details  Name: [name] [name] MRN: [id number] Date of Birth: [birth year] Referring Provider (PT): Lauris Poag, MD   Encounter Date: 12/06/2020   PT End of Session - 12/06/20 1353     Visit Number 2    Number of Visits 24    Date for PT Re-Evaluation 02/26/21    Authorization Type MEDICARE PART B reporting period from 12/04/2020    Progress Note Due on Visit 10    PT Start Time 1350    PT Stop Time 1430    PT Time Calculation (min) 40 min    Activity Tolerance Patient tolerated treatment well    Behavior During Therapy [facility] for tasks assessed/performed             Past Medical History:  Diagnosis Date   Anemia    Arthritis    CAD (coronary artery disease)    Chronic kidney disease    CKD stage 3   DDD (degenerative disc disease), thoracic    Diabetes mellitus without complication (HCC)    Eczema    GERD (gastroesophageal reflux disease)    Heart murmur    History of dysplastic nevus 12/09/2019   right calf/moderate   History of heart artery stent 2014   Per patient, [name] put in 2 stents in 2014.   History of MRSA infection 02/2013   groin, after PTCA   Hyperlipidemia    Hypertension    Hypothyroidism    Macular degeneration    Myocardial infarction ([place]) 1999   Per Pt, MI in 1999.   PVD (peripheral vascular disease) [facility])     Past Surgical History:  Procedure Laterality Date   BACK SURGERY     BICEPT TENODESIS Left 05/13/2017   Procedure: BICEPS TENODESIS;  Surgeon: [name], MD;  Location: [facility] ORS;  Service: Orthopedics;  Laterality: Left;   CHOLECYSTECTOMY N/A 10/23/2017   Procedure: LAPAROSCOPIC CHOLECYSTECTOMY;  Surgeon: [name], MD;  Location: [facility] ORS;  Service: General;  Laterality: N/A;   CORONARY ANGIOPLASTY     JOINT REPLACEMENT  Bilateral    knee   JOINT REPLACEMENT Right    hip   SHOULDER ARTHROSCOPY WITH OPEN ROTATOR CUFF REPAIR Left 05/13/2017   Procedure: SHOULDER ARTHROSCOPY WITH OPEN ROTATOR CUFF REPAIR;  Surgeon: [name], MD;  Location: [facility] ORS;  Service: Orthopedics;  Laterality: Left;  subacromial decompression, debridement   TONSILLECTOMY     UMBILICAL HERNIA REPAIR  10/23/2017   Procedure: HERNIA REPAIR UMBILICAL ADULT;  Surgeon: [name], MD;  Location: [facility] ORS;  Service: General;;   VASECTOMY      There were no vitals filed for this visit.   Subjective Assessment - 12/06/20 1351     Subjective Patient reports 2-3/10 hip pain in the right anterior thigh and groin when he actively flexes the hip. States he wents to [employer] class this morning and it has been a bit sore after that. He did a lot of exercises in that class. Felt okay after last PT session.    Pertinent History Patient is a 80 y.o. 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 This patient's chief complaints consist of intermittent right lateral hip and thigh pain and  intermittent right groin pain leading to the following functional deficits: difficulty sleeping, prolonged sitting, driving/traveling, sitting up, getting in/out of a car, bed mobility, turning over in bed, golfing.   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 [name]), diabetes with neuropathy, chronic renal insufficiency, stage 3,  cholecystectomy, CAD, GERD, HTN, hypothyroidism, PVD, B knee TKA, R hip THA (2009), L RTC repair (2019), back surgery.  Patient denies hx of cancer, stroke, seizures, lung problem,  unexplained weight loss, changes in bowel or bladder problems, new onset  stumbling or dropping things, neck surgery.    Limitations Sitting;Walking;House hold activities    How long can you walk comfortably? 20 min    Diagnostic tests X-ray Impression 11/22/2020: "Stable appearance to prior right total hip"    Patient Stated Goals "no pain"    Currently in Pain? Yes    Pain Score 3     Pain Location Hip    Pain Orientation Right;Anterior    Pain Onset More than a month ago              TREATMENT:   denies sensitivity to latex  Manual therapy: to reduce pain and tissue tension, improve range of motion, neuromodulation, in order to promote improved ability to complete functional activities. PRONE with face in table cradle and pillow under ankles - STM to right posterior and lateral hip musculature (minimal tenderness except near posterior greater trochanter).  SUPINE and SIDELYING - STM with and without "the stick" to right quads and lateral thigh soft tissue to decrease pain and tension.    Therapeutic exercise: to centralize symptoms and improve ROM, strength, muscular endurance, and activity tolerance required for successful completion of functional activities.  - NuStep level 3 using bilateral lower extremities only. Seat setting 12.  For improved extremity mobility, muscular endurance, and activity tolerance; and to induce the analgesic effect of aerobic exercise, stimulate improved joint nutrition, and prepare body structures and systems for following interventions. x 5 minutes. Average SPM = 98 - Prone hip extension with knee flexed to preferentially bias gluteus maximus. 3x10 Cuing to keep knee flexed. Pt required time to learn and for transitions between sides.  - hooklying bridge 1x10 with 5 second hold.  - hooklying bridge with abduction against blue theraband around distal thigh, 5 second holds, 1x20.  - seated R hip isometric flexion against pt's hand, 1x20 with 5 second hold - standing hip diagonal extension against RTB around ankle, 3x10 with  5 second hold.  - Education on HEP including handout    Pt required multimodal cuing for proper technique and to facilitate improved neuromuscular control, strength, range of motion, and functional ability resulting in improved performance and form.     HOME EXERCISE PROGRAM Access Code: [email or web address] URL: [email or web address] Date: 12/06/2020 Prepared by: Rosita Kea  Exercises Bridge with Hip Abduction and Resistance - Ground Touches - 1 x daily - 1 sets - 20 reps - 5 seconds hold Isometric hip flexion - 1 x daily - 1 sets - 20 reps - 5 seconds hold Diagonal Hip Extension with Resistance - 1 x daily - 3 sets - 10 reps - 5 seconds hold   PT Education - 12/06/20 1353     Education Details Exercise purpose/form. Self management techniques    Person(s) Educated Patient    Methods Explanation;Demonstration;Tactile cues;Verbal cues;Handout    Comprehension Verbalized understanding;Returned  demonstration;Verbal cues required;Tactile cues required;Need further instruction              PT Short Term Goals - 12/06/20 1355       PT SHORT TERM GOAL #1   Title Be independent with initial home exercise program for self-management of symptoms.    Baseline Initial HEP provided at visit 1 (12/04/2020);    Time 2    Period Weeks    Status Achieved    Target Date 12/19/20               PT Long Term Goals - 12/05/20 1328       PT LONG TERM GOAL #1   Title Be independent with a long-term home exercise program for self-management of symptoms.    Baseline Initial HEP provided at visit 1 (12/04/2020);    Time 12    Period Weeks    Status New   TARGET DATE FOR ALL LONG TERM GOALS: 02/26/2021     PT LONG TERM GOAL #2   Title Demonstrate improved FOTO to equal or greater than 76 by visit #10 to demonstrate improvement in overall condition and self-reported functional ability.    Baseline 72 (12/04/2020);    Time 12    Period Weeks    Status New      PT LONG TERM GOAL #3    Title Improve B hip strength to equal or greater than 4+/5 with no increase in pain to improve patient's ability to complete functional tasks such as dressing, bed mobility, traveling with less difficulty.    Baseline R hip flexion 2/5 (12/04/2020);    Time 12    Period Weeks    Status New      PT LONG TERM GOAL #4   Title Reduce pain with functional activities to equal or less than 1/10 to allow patient to complete usual activities including dressing, walking, traveling with less difficulty.    Baseline worst 5/10 (12/04/2020);    Time 12    Period Weeks    Status New      PT LONG TERM GOAL #5   Title Complete community, work and/or recreational activities without limitation due to current condition.    Baseline Functional Limitations: sleeping, prolonged sitting, driving/traveling, sitting up, getting in/out of a car, bed mobility, turning over in bed, golfing, walking, dressing (12/04/2020);    Time 12    Period Weeks    Status New                   Plan - 12/06/20 1429     Clinical Impression Statement Patient tolerated treatment well overall and was able to progress his exercises in clinic and for HEP. Did get short of breath quickly but felt confident in continuing exercises. TTP and tight at right lateral and mid quads and continues to have difficulty actively flexing R hip. Patient would benefit from continued management of limiting condition by skilled physical therapist to address remaining impairments and functional limitations to work towards stated goals and return to PLOF or maximal functional independence.    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 [name]), diabetes with neuropathy, chronic renal insufficiency,  stage 3,  cholecystectomy, CAD, GERD, HTN, hypothyroidism, PVD, B knee TKA, R hip THA (2009), L RTC repair (2019), back  surgery    Examination-Activity Limitations Bed Mobility;Stairs;Sleep;Sit;Dressing;Locomotion Level;Bend    Examination-Participation Restrictions Community Activity;Driving;Interpersonal Relationship   sleeping, prolonged sitting, driving/traveling, sitting up, getting in/out of a car, bed mobility, turning over in bed, golfing, walking.   Stability/Clinical Decision Making Stable/Uncomplicated    Rehab Potential Good    PT Frequency 2x / week    PT Duration 12 weeks    PT Treatment/Interventions ADLs/Self Care Home Management;Cryotherapy;Moist Heat;Electrical Stimulation;DME Instruction;Gait training;Stair training;Functional mobility training;Therapeutic activities;Therapeutic exercise;Balance training;Neuromuscular re-education;Manual techniques;Dry needling;Joint Manipulations;Spinal Manipulations    PT Next Visit Plan update HEP as appropriate, strengthening as tolerated, manual as appropriate    PT Home Exercise Plan Medbridge Access Code: [email or web address]    [email or web address] and Agree with Plan of Care Patient             Patient will benefit from skilled therapeutic intervention in order to improve the following deficits and impairments:  Improper body mechanics, Pain, Decreased mobility, Increased muscle spasms, Decreased activity tolerance, Decreased endurance, Decreased range of motion, Decreased strength, Hypomobility, Impaired perceived functional ability, Difficulty walking  Visit Diagnosis: Pain in right hip  Chronic bilateral low back pain, unspecified whether sciatica present  Muscle weakness (generalized)     Problem List Patient Active Problem List   Diagnosis Date Noted   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 Graylon Good, PT, DPT 12/06/20, 2:31 PM  [facility] [address], [place], [address] Phone: [phone]   Fax:  [phone]  Name: [name] [name] MRN: [id number] Date of Birth: Aug 07, 1940

## 2020-12-07 ENCOUNTER — Encounter: Payer: Medicare Other | Admitting: Physical Therapy

## 2020-12-11 ENCOUNTER — Other Ambulatory Visit: Payer: Self-pay

## 2020-12-11 ENCOUNTER — Encounter: Payer: Self-pay | Admitting: Physical Therapy

## 2020-12-11 ENCOUNTER — Ambulatory Visit: Payer: Medicare Other | Admitting: Physical Therapy

## 2020-12-11 DIAGNOSIS — G8929 Other chronic pain: Secondary | ICD-10-CM

## 2020-12-11 DIAGNOSIS — M545 Low back pain, unspecified: Secondary | ICD-10-CM

## 2020-12-11 DIAGNOSIS — M6281 Muscle weakness (generalized): Secondary | ICD-10-CM

## 2020-12-11 DIAGNOSIS — M25551 Pain in right hip: Secondary | ICD-10-CM | POA: Diagnosis not present

## 2020-12-11 NOTE — Therapy (Signed)
[facility] [address], [place], [address] Phone: [phone]   Fax:  [phone]  Physical Therapy Treatment  Patient Details  Name: [name] [name] MRN: [id number] Date of Birth: March 22, 1940 Referring Provider (PT): Lauris Poag, MD   Encounter Date: 12/11/2020   PT End of Session - 12/11/20 1822     Visit Number 3    Number of Visits 24    Date for PT Re-Evaluation 02/26/21    Authorization Type MEDICARE PART B reporting period from 12/04/2020    Progress Note Due on Visit 10    PT Start Time 1819    PT Stop Time 1859    PT Time Calculation (min) 40 min    Activity Tolerance Patient tolerated treatment well    Behavior During Therapy [facility] for tasks assessed/performed             Past Medical History:  Diagnosis Date   Anemia    Arthritis    CAD (coronary artery disease)    Chronic kidney disease    CKD stage 3   DDD (degenerative disc disease), thoracic    Diabetes mellitus without complication (HCC)    Eczema    GERD (gastroesophageal reflux disease)    Heart murmur    History of dysplastic nevus 12/09/2019   right calf/moderate   History of heart artery stent 2014   Per patient, [name] put in 2 stents in 2014.   History of MRSA infection 02/2013   groin, after PTCA   Hyperlipidemia    Hypertension    Hypothyroidism    Macular degeneration    Myocardial infarction ([place]) 1999   Per Pt, MI in 1999.   PVD (peripheral vascular disease) [facility])     Past Surgical History:  Procedure Laterality Date   BACK SURGERY     BICEPT TENODESIS Left 05/13/2017   Procedure: BICEPS TENODESIS;  Surgeon: [name], MD;  Location: [facility] ORS;  Service: Orthopedics;  Laterality: Left;   CHOLECYSTECTOMY N/A 10/23/2017   Procedure: LAPAROSCOPIC CHOLECYSTECTOMY;  Surgeon: [name], MD;  Location: [facility] ORS;  Service: General;  Laterality: N/A;   CORONARY ANGIOPLASTY     JOINT REPLACEMENT  Bilateral    knee   JOINT REPLACEMENT Right    hip   SHOULDER ARTHROSCOPY WITH OPEN ROTATOR CUFF REPAIR Left 05/13/2017   Procedure: SHOULDER ARTHROSCOPY WITH OPEN ROTATOR CUFF REPAIR;  Surgeon: [name], MD;  Location: [facility] ORS;  Service: Orthopedics;  Laterality: Left;  subacromial decompression, debridement   TONSILLECTOMY     UMBILICAL HERNIA REPAIR  10/23/2017   Procedure: HERNIA REPAIR UMBILICAL ADULT;  Surgeon: [name], MD;  Location: [facility] ORS;  Service: General;;   VASECTOMY      There were no vitals filed for this visit.   Subjective Assessment - 12/11/20 1820     Subjective Patient reports his leg is a lot better but he still has some lateral hip pain when he lays on it. Report no pain currently. States he does have some pain in his groin a times but it is a whole lot better. Has been doing his HEP and has no questions about them. Denie pain currently.    Pertinent History Patient is a 80 y.o. 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 This patient's chief complaints consist of intermittent  right lateral hip and thigh pain and intermittent right groin pain leading to the following functional deficits: difficulty sleeping, prolonged sitting, driving/traveling, sitting up, getting in/out of a car, bed mobility, turning over in bed, golfing.   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 [name]), diabetes with neuropathy, chronic renal insufficiency, stage 3,  cholecystectomy, CAD, GERD, HTN, hypothyroidism, PVD, B knee TKA, R hip THA (2009), L RTC repair (2019), back surgery.  Patient denies hx of cancer, stroke, seizures, lung problem,  unexplained weight loss, changes in bowel or bladder  problems, new onset stumbling or dropping things, neck surgery.    Limitations Sitting;Walking;House hold activities    How long can you walk comfortably? 20 min    Diagnostic tests X-ray Impression 11/22/2020: "Stable appearance to prior right total hip"    Patient Stated Goals "no pain"    Currently in Pain? No/denies    Pain Onset More than a month ago             TREATMENT:   denies sensitivity to latex   Manual therapy: to reduce pain and tissue tension, improve range of motion, neuromodulation, in order to promote improved ability to complete functional activities. PRONE with face in table cradle and pillow under ankles - STM to right posterior and lateral hip musculature (minimal tenderness except near posterior greater trochanter).  SUPINE and SIDELYING - STM with and without "the stick" to right quads and lateral thigh soft tissue to decrease pain and tension.   Modality: (unbilled) Dry needling performed to right quads to decrease pain and spasms along patient's right quad and hip region with patient in supine utilizing (1) dry needle(s) .22mm x 97mm with (4) sticks at right quadratus femoris and vastus lateralis . Patient educated about the risks and benefits from therapy and verbally consents to treatment.  Dry needling performed by Everlean Alstrom. Graylon Good PT, DPT who is certified in this technique.   Therapeutic exercise: to centralize symptoms and improve ROM, strength, muscular endurance, and activity tolerance required for successful completion of functional activities.  - NuStep level 4 using bilateral lower extremities only. Seat setting 12.  For improved extremity mobility, muscular endurance, and activity tolerance; and to induce the analgesic effect of aerobic exercise, stimulate improved joint nutrition, and prepare body structures and systems for following interventions. x 5 minutes. Average SPM = 108 (Manual therapy/dry needling - see above).  - hooklying bridge with  abduction against blue theraband around distal thigh, 5 second holds, 1x20.  - seated R hip isometric flexion against pt's hand, 1x5 with 5 second hold (good technique) - seated R hip flexion AROM, 1x5. Patient reports similar pain to isometric exercise.  - standing slow R hip flexion, 1x10. Pain got worse as he continued but calms at end.  - SLS toe taps with R LE supporting, 1x10, 1 finger support - SLS hamstring curl holds with L LE supporting, 1x10, 1 finger support   Pt required multimodal cuing for proper technique and to facilitate improved neuromuscular control, strength, range of motion, and functional ability resulting in improved performance and form.     HOME EXERCISE PROGRAM Access Code: [email or web address] URL: [email or web address] Date: 12/06/2020 Prepared by: Rosita Kea   Exercises Bridge with Hip Abduction and Resistance - Ground Touches - 1 x daily - 1 sets - 20 reps - 5 seconds hold Isometric hip flexion -  1 x daily - 1 sets - 20 reps - 5 seconds hold Diagonal Hip Extension with Resistance - 1 x daily - 3 sets - 10 reps - 5 seconds hold    PT Education - 12/11/20 1822     Education Details Exercise purpose/form. Self management techniques    Person(s) Educated Patient    Methods Explanation;Demonstration;Tactile cues;Verbal cues    Comprehension Verbalized understanding;Returned demonstration;Verbal cues required;Tactile cues required;Need further instruction              PT Short Term Goals - 12/06/20 1355       PT SHORT TERM GOAL #1   Title Be independent with initial home exercise program for self-management of symptoms.    Baseline Initial HEP provided at visit 1 (12/04/2020);    Time 2    Period Weeks    Status Achieved    Target Date 12/19/20               PT Long Term Goals - 12/05/20 1328       PT LONG TERM GOAL #1   Title Be independent with a long-term home exercise program for self-management of symptoms.    Baseline Initial  HEP provided at visit 1 (12/04/2020);    Time 12    Period Weeks    Status New   TARGET DATE FOR ALL LONG TERM GOALS: 02/26/2021     PT LONG TERM GOAL #2   Title Demonstrate improved FOTO to equal or greater than 76 by visit #10 to demonstrate improvement in overall condition and self-reported functional ability.    Baseline 72 (12/04/2020);    Time 12    Period Weeks    Status New      PT LONG TERM GOAL #3   Title Improve B hip strength to equal or greater than 4+/5 with no increase in pain to improve patient's ability to complete functional tasks such as dressing, bed mobility, traveling with less difficulty.    Baseline R hip flexion 2/5 (12/04/2020);    Time 12    Period Weeks    Status New      PT LONG TERM GOAL #4   Title Reduce pain with functional activities to equal or less than 1/10 to allow patient to complete usual activities including dressing, walking, traveling with less difficulty.    Baseline worst 5/10 (12/04/2020);    Time 12    Period Weeks    Status New      PT LONG TERM GOAL #5   Title Complete community, work and/or recreational activities without limitation due to current condition.    Baseline Functional Limitations: sleeping, prolonged sitting, driving/traveling, sitting up, getting in/out of a car, bed mobility, turning over in bed, golfing, walking, dressing (12/04/2020);    Time 12    Period Weeks    Status New                   Plan - 12/11/20 1938     Clinical Impression Statement Patient tolerated well overall and demonstrates good carry over of exercises utilized in HEP. Continues to breathe hard with exertion. Dry needling performed today at tight and tender portions of the quads. Patient continues to have pain with R hip flexion that limits his ability to put on his shoes, dress, etc. Patient would benefit from continued management of limiting condition by skilled physical therapist to address remaining impairments and functional limitations  to work towards stated goals and return to [facility]  or maximal functional independence.    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 [name]), diabetes with neuropathy, chronic renal insufficiency, stage 3,  cholecystectomy, CAD, GERD, HTN, hypothyroidism, PVD, B knee TKA, R hip THA (2009), L RTC repair (2019), back surgery    Examination-Activity Limitations Bed Mobility;Stairs;Sleep;Sit;Dressing;Locomotion Level;Bend    Examination-Participation Restrictions Community Activity;Driving;Interpersonal Relationship   sleeping, prolonged sitting, driving/traveling, sitting up, getting in/out of a car, bed mobility, turning over in bed, golfing, walking.   Stability/Clinical Decision Making Stable/Uncomplicated    Rehab Potential Good    PT Frequency 2x / week    PT Duration 12 weeks    PT Treatment/Interventions ADLs/Self Care Home Management;Cryotherapy;Moist Heat;Electrical Stimulation;DME Instruction;Gait training;Stair training;Functional mobility training;Therapeutic activities;Therapeutic exercise;Balance training;Neuromuscular re-education;Manual techniques;Dry needling;Joint Manipulations;Spinal Manipulations    PT Next Visit Plan update HEP as appropriate, strengthening as tolerated, manual as appropriate    PT Home Exercise Plan Medbridge Access Code: [email or web address]    [email or web address] and Agree with Plan of Care Patient             Patient will benefit from skilled therapeutic intervention in order to improve the following deficits and impairments:  Improper body mechanics, Pain, Decreased mobility, Increased muscle spasms, Decreased activity tolerance, Decreased endurance, Decreased range of motion, Decreased strength, Hypomobility, Impaired perceived  functional ability, Difficulty walking  Visit Diagnosis: Pain in right hip  Chronic bilateral low back pain, unspecified whether sciatica present  Muscle weakness (generalized)     Problem List Patient Active Problem List   Diagnosis Date Noted   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 Graylon Good, PT, DPT 12/11/20, 7:40 PM   [place] [facility] [address], [place], [address] Phone: [phone]   Fax:  [phone]  Name: [name] [name] MRN: [id number] Date of Birth: Sep 13, 1940

## 2020-12-12 ENCOUNTER — Encounter: Payer: Medicare Other | Admitting: Physical Therapy

## 2020-12-14 ENCOUNTER — Ambulatory Visit: Payer: Medicare Other | Admitting: Physical Therapy

## 2020-12-14 ENCOUNTER — Encounter: Payer: Self-pay | Admitting: Physical Therapy

## 2020-12-14 DIAGNOSIS — G8929 Other chronic pain: Secondary | ICD-10-CM

## 2020-12-14 DIAGNOSIS — M25551 Pain in right hip: Secondary | ICD-10-CM

## 2020-12-14 DIAGNOSIS — M545 Low back pain, unspecified: Secondary | ICD-10-CM

## 2020-12-14 DIAGNOSIS — M6281 Muscle weakness (generalized): Secondary | ICD-10-CM

## 2020-12-14 NOTE — Therapy (Signed)
[facility] [address], [place], [address] Phone: [phone]   Fax:  [phone]  Physical Therapy Treatment  Patient Details  Name: Marsh Heckler MRN: [id number] Date of Birth: Jul 11, 1940 Referring Provider (PT): Lauris Poag, MD   Encounter Date: 12/14/2020   PT End of Session - 12/14/20 1433     Visit Number 4    Number of Visits 24    Date for PT Re-Evaluation 02/26/21    Authorization Type MEDICARE PART B reporting period from 12/04/2020    Progress Note Due on Visit 10    PT Start Time 1430    PT Stop Time 1510    PT Time Calculation (min) 40 min    Activity Tolerance Patient tolerated treatment well    Behavior During Therapy [facility] for tasks assessed/performed             Past Medical History:  Diagnosis Date   Anemia    Arthritis    CAD (coronary artery disease)    Chronic kidney disease    CKD stage 3   DDD (degenerative disc disease), thoracic    Diabetes mellitus without complication (HCC)    Eczema    GERD (gastroesophageal reflux disease)    Heart murmur    History of dysplastic nevus 12/09/2019   right calf/moderate   History of heart artery stent 2014   Per patient, [name] put in 2 stents in 2014.   History of MRSA infection 02/2013   groin, after PTCA   Hyperlipidemia    Hypertension    Hypothyroidism    Macular degeneration    Myocardial infarction ([place]) 1999   Per Pt, MI in 1999.   PVD (peripheral vascular disease) [facility])     Past Surgical History:  Procedure Laterality Date   BACK SURGERY     BICEPT TENODESIS Left 05/13/2017   Procedure: BICEPS TENODESIS;  Surgeon: [name], MD;  Location: [facility] ORS;  Service: Orthopedics;  Laterality: Left;   CHOLECYSTECTOMY N/A 10/23/2017   Procedure: LAPAROSCOPIC CHOLECYSTECTOMY;  Surgeon: [name], MD;  Location: [facility] ORS;  Service: General;  Laterality: N/A;   CORONARY ANGIOPLASTY     JOINT REPLACEMENT  Bilateral    knee   JOINT REPLACEMENT Right    hip   SHOULDER ARTHROSCOPY WITH OPEN ROTATOR CUFF REPAIR Left 05/13/2017   Procedure: SHOULDER ARTHROSCOPY WITH OPEN ROTATOR CUFF REPAIR;  Surgeon: [name], MD;  Location: [facility] ORS;  Service: Orthopedics;  Laterality: Left;  subacromial decompression, debridement   TONSILLECTOMY     UMBILICAL HERNIA REPAIR  10/23/2017   Procedure: HERNIA REPAIR UMBILICAL ADULT;  Surgeon: [name], MD;  Location: [facility] ORS;  Service: General;;   VASECTOMY      There were no vitals filed for this visit.   Subjective Assessment - 12/14/20 1431     Subjective Patient reports no pain upon arrival and states the last time he had pain was last night. States he doesn't think the dry needling hurt anything. Feels better overall but not all the way better. HEP is going well.    Pertinent History Patient is a 80 y.o. 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 This patient's chief complaints consist of intermittent right lateral hip and thigh pain and intermittent right groin pain leading to the following functional deficits: difficulty  sleeping, prolonged sitting, driving/traveling, sitting up, getting in/out of a car, bed mobility, turning over in bed, golfing.   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 [name]), diabetes with neuropathy, chronic renal insufficiency, stage 3,  cholecystectomy, CAD, GERD, HTN, hypothyroidism, PVD, B knee TKA, R hip THA (2009), L RTC repair (2019), back surgery.  Patient denies hx of cancer, stroke, seizures, lung problem,  unexplained weight loss, changes in bowel or bladder problems, new onset stumbling or dropping things, neck surgery.     Limitations Sitting;Walking;House hold activities    How long can you walk comfortably? 20 min    Diagnostic tests X-ray Impression 11/22/2020: "Stable appearance to prior right total hip"    Patient Stated Goals "no pain"    Currently in Pain? No/denies               TREATMENT:   denies sensitivity to latex   Manual therapy: to reduce pain and tissue tension, improve range of motion, neuromodulation, in order to promote improved ability to complete functional activities. SUPINE  - STM to right hip flexors, adductors, quads, medial hamstrings, and lateral thigh soft tissue to decrease pain and tension.  SIDELYING - Palpation to glute med/min and posterior lateral hip to find trigger points (none identified today).    Modality: (unbilled) Dry needling performed to right hip adductor group to decrease pain and spasms along patient's right anterior hip region with patient in supine utilizing (1) dry needle(s) .57mm x 2mm with (3) sticks at right hip adductor group. Patient educated about the risks and benefits from therapy and verbally consents to treatment.  Dry needling performed by Everlean Alstrom. Graylon Good PT, DPT who is certified in this technique.   Therapeutic exercise: to centralize symptoms and improve ROM, strength, muscular endurance, and activity tolerance required for successful completion of functional activities.  - NuStep level 5 using bilateral lower extremities only. Seat setting 12.  For improved extremity mobility, muscular endurance, and activity tolerance; and to induce the analgesic effect of aerobic exercise, stimulate improved joint nutrition, and prepare body structures and systems for following interventions. x 5 minutes. Average SPM = 110  (Manual therapy/dry needling - see above).   - hooklying bridge with abduction against blue theraband around distal thigh, 5 second holds, 1x20.  - forward and backwards monster walks with blue theraband around knees, 3x20 feet each  direction.  - lateral step up to 6 inch step 2x10 R side up (difficulty with pain over quad that gave way 2nd set, unable to control eccentric portion to heel tap), 1x10 L step up (able to perform with heel tap) with B UE support).   Pt required multimodal cuing for proper technique and to facilitate improved neuromuscular control, strength, range of motion, and functional ability resulting in improved performance and form.     HOME EXERCISE PROGRAM Access Code: [email or web address] URL: [email or web address] Date: 12/06/2020 Prepared by: Rosita Kea   Exercises Bridge with Hip Abduction and Resistance - Ground Touches - 1 x daily - 1 sets - 20 reps - 5 seconds hold Isometric hip flexion - 1 x daily - 1 sets - 20 reps - 5 seconds hold Diagonal Hip Extension with Resistance - 1 x daily - 3 sets - 10 reps - 5 seconds hold     PT Education - 12/14/20 1432     Education Details Exercise purpose/form.  Self management techniques    Person(s) Educated Patient    Methods Explanation;Demonstration;Tactile cues;Verbal cues    Comprehension Verbalized understanding;Returned demonstration;Verbal cues required;Tactile cues required;Need further instruction              PT Short Term Goals - 12/06/20 1355       PT SHORT TERM GOAL #1   Title Be independent with initial home exercise program for self-management of symptoms.    Baseline Initial HEP provided at visit 1 (12/04/2020);    Time 2    Period Weeks    Status Achieved    Target Date 12/19/20               PT Long Term Goals - 12/05/20 1328       PT LONG TERM GOAL #1   Title Be independent with a long-term home exercise program for self-management of symptoms.    Baseline Initial HEP provided at visit 1 (12/04/2020);    Time 12    Period Weeks    Status New   TARGET DATE FOR ALL LONG TERM GOALS: 02/26/2021     PT LONG TERM GOAL #2   Title Demonstrate improved FOTO to equal or greater than 76 by visit #10 to  demonstrate improvement in overall condition and self-reported functional ability.    Baseline 72 (12/04/2020);    Time 12    Period Weeks    Status New      PT LONG TERM GOAL #3   Title Improve B hip strength to equal or greater than 4+/5 with no increase in pain to improve patient's ability to complete functional tasks such as dressing, bed mobility, traveling with less difficulty.    Baseline R hip flexion 2/5 (12/04/2020);    Time 12    Period Weeks    Status New      PT LONG TERM GOAL #4   Title Reduce pain with functional activities to equal or less than 1/10 to allow patient to complete usual activities including dressing, walking, traveling with less difficulty.    Baseline worst 5/10 (12/04/2020);    Time 12    Period Weeks    Status New      PT LONG TERM GOAL #5   Title Complete community, work and/or recreational activities without limitation due to current condition.    Baseline Functional Limitations: sleeping, prolonged sitting, driving/traveling, sitting up, getting in/out of a car, bed mobility, turning over in bed, golfing, walking, dressing (12/04/2020);    Time 12    Period Weeks    Status New                   Plan - 12/14/20 1513     Clinical Impression Statement Patient reported mild relief of L hip pain with hip flexion after manual/dry needling. Palpation of R hip adductors reproduced anterior pain with palpation. Patient also demo poor eccentric control with R supporting lateral step up. Patient appears to be improving but continues to have difficulty with functional activities such as sleeping and mobility. Patient would benefit from continued management of limiting condition by skilled physical therapist to address remaining impairments and functional limitations to work towards stated goals and return to PLOF or maximal functional independence.    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 [name]), diabetes with neuropathy, chronic renal insufficiency, stage 3,  cholecystectomy, CAD, GERD, HTN, hypothyroidism, PVD, B knee TKA, R hip THA (2009), L RTC repair (2019), back surgery    Examination-Activity Limitations Bed Mobility;Stairs;Sleep;Sit;Dressing;Locomotion Level;Bend    Examination-Participation Restrictions Community Activity;Driving;Interpersonal Relationship   sleeping, prolonged sitting, driving/traveling, sitting up, getting in/out of a car, bed mobility, turning over in bed, golfing, walking.   Stability/Clinical Decision Making Stable/Uncomplicated    Rehab Potential Good    PT Frequency 2x / week    PT Duration 12 weeks    PT Treatment/Interventions ADLs/Self Care Home Management;Cryotherapy;Moist Heat;Electrical Stimulation;DME Instruction;Gait training;Stair training;Functional mobility training;Therapeutic activities;Therapeutic exercise;Balance training;Neuromuscular re-education;Manual techniques;Dry needling;Joint Manipulations;Spinal Manipulations    PT Next Visit Plan update HEP as appropriate, strengthening as tolerated, manual as appropriate    PT Home Exercise Plan Medbridge Access Code: [email or web address]    MOLMBEMLJ and Agree with Plan of Care Patient             Patient will benefit from skilled therapeutic intervention in order to improve the following deficits and impairments:  Improper body mechanics, Pain, Decreased mobility, Increased muscle spasms, Decreased activity tolerance, Decreased endurance, Decreased range of motion, Decreased strength, Hypomobility, Impaired perceived functional ability, Difficulty walking  Visit Diagnosis: Pain in right hip  Chronic bilateral low back pain, unspecified whether sciatica present  Muscle weakness  (generalized)     Problem List Patient Active Problem List   Diagnosis Date Noted   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 Graylon Good, PT, DPT 12/14/20, 3:13 PM   [facility] [address], [place], [address] Phone: [phone]   Fax:  [phone]  Name: Harlin Mazzoni MRN: [id number] Date of Birth: [birth year]

## 2020-12-18 ENCOUNTER — Ambulatory Visit: Payer: Medicare Other | Admitting: Physical Therapy

## 2020-12-21 ENCOUNTER — Other Ambulatory Visit: Payer: Self-pay

## 2020-12-21 ENCOUNTER — Encounter: Payer: Self-pay | Admitting: Physical Therapy

## 2020-12-21 ENCOUNTER — Ambulatory Visit: Payer: Medicare Other | Admitting: Physical Therapy

## 2020-12-21 DIAGNOSIS — M545 Low back pain, unspecified: Secondary | ICD-10-CM

## 2020-12-21 DIAGNOSIS — M25551 Pain in right hip: Secondary | ICD-10-CM | POA: Diagnosis not present

## 2020-12-21 DIAGNOSIS — M6281 Muscle weakness (generalized): Secondary | ICD-10-CM

## 2020-12-21 NOTE — Therapy (Signed)
[facility] [address], [place], [address] Phone: [phone]   Fax:  [phone]  Physical Therapy Treatment  Patient Details  Name: [name] [name] MRN: [id number] Date of Birth: Mar 20, 1940 Referring Provider (PT): Lauris Poag, MD   Encounter Date: 12/21/2020   PT End of Session - 12/21/20 1824     Visit Number 5    Number of Visits 24    Date for PT Re-Evaluation 02/26/21    Authorization Type MEDICARE PART B reporting period from 12/04/2020    Progress Note Due on Visit 10    PT Start Time 1820    PT Stop Time 1900    PT Time Calculation (min) 40 min    Activity Tolerance Patient tolerated treatment well    Behavior During Therapy [facility] for tasks assessed/performed             Past Medical History:  Diagnosis Date   Anemia    Arthritis    CAD (coronary artery disease)    Chronic kidney disease    CKD stage 3   DDD (degenerative disc disease), thoracic    Diabetes mellitus without complication (HCC)    Eczema    GERD (gastroesophageal reflux disease)    Heart murmur    History of dysplastic nevus 12/09/2019   right calf/moderate   History of heart artery stent 2014   Per patient, [name] put in 2 stents in 2014.   History of MRSA infection 02/2013   groin, after PTCA   Hyperlipidemia    Hypertension    Hypothyroidism    Macular degeneration    Myocardial infarction ([place]) 1999   Per Pt, MI in 1999.   PVD (peripheral vascular disease) [facility])     Past Surgical History:  Procedure Laterality Date   BACK SURGERY     BICEPT TENODESIS Left 05/13/2017   Procedure: BICEPS TENODESIS;  Surgeon: [name], MD;  Location: [facility] ORS;  Service: Orthopedics;  Laterality: Left;   CHOLECYSTECTOMY N/A 10/23/2017   Procedure: LAPAROSCOPIC CHOLECYSTECTOMY;  Surgeon: [name], MD;  Location: [facility] ORS;  Service: General;  Laterality: N/A;   CORONARY ANGIOPLASTY     JOINT REPLACEMENT  Bilateral    knee   JOINT REPLACEMENT Right    hip   SHOULDER ARTHROSCOPY WITH OPEN ROTATOR CUFF REPAIR Left 05/13/2017   Procedure: SHOULDER ARTHROSCOPY WITH OPEN ROTATOR CUFF REPAIR;  Surgeon: [name], MD;  Location: [facility] ORS;  Service: Orthopedics;  Laterality: Left;  subacromial decompression, debridement   TONSILLECTOMY     UMBILICAL HERNIA REPAIR  10/23/2017   Procedure: HERNIA REPAIR UMBILICAL ADULT;  Surgeon: [name], MD;  Location: [facility] ORS;  Service: General;;   VASECTOMY      There were no vitals filed for this visit.   Subjective Assessment - 12/21/20 1821     Subjective Patient reports he continues to have pain at his R lateral hip of about 2-3/10 and worse at night or when he moves a certain way. He could not tell that dry needling performed last session made any difference. He states his HEP is going well. None are getting too easy. States he thinks his groin is better and maybe the needle helped that.    Pertinent History Patient is a 80 y.o. 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  This patient's chief complaints consist of intermittent right lateral hip and thigh pain and intermittent right groin pain leading to the following functional deficits: difficulty sleeping, prolonged sitting, driving/traveling, sitting up, getting in/out of a car, bed mobility, turning over in bed, golfing.   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 [name]), diabetes with neuropathy, chronic renal insufficiency, stage 3,  cholecystectomy, CAD, GERD, HTN, hypothyroidism, PVD, B knee TKA, R hip THA (2009), L RTC repair (2019), back surgery.  Patient denies hx of cancer, stroke, seizures, lung problem,   unexplained weight loss, changes in bowel or bladder problems, new onset stumbling or dropping things, neck surgery.    Limitations Sitting;Walking;House hold activities    How long can you walk comfortably? 20 min    Diagnostic tests X-ray Impression 11/22/2020: "Stable appearance to prior right total hip"    Patient Stated Goals "no pain"    Currently in Pain? Yes    Pain Score 3              OBJECTIVE  SELF-REPORTED FUNCTION FOTO score: 80/100 (hip questionnaire)  TREATMENT:  denies sensitivity to latex   Manual therapy: to reduce pain and tissue tension, improve range of motion, neuromodulation, in order to promote improved ability to complete functional activities.  SIDELYING - STM to glute med/min and posterior lateral hip, lateral quads, and lateral hamstrings.    Modality: (unbilled) Dry needling performed to right lateral hip and thigh to decrease pain and spasms along patient's R lateral hip and LE region with patient in supine utilizing (2) dry needle(s) .73mm x 61mm with (1) sticks at right glute med/min, (1) stick at vastus laterais anterior to IT band, and (1) stick at vastus lateralis posterior to IT band. Patient educated about the risks and benefits from therapy and verbally consents to treatment.  Dry needling performed by Everlean Alstrom. Graylon Good PT, DPT who is certified in this technique.   Therapeutic exercise: to centralize symptoms and improve ROM, strength, muscular endurance, and activity tolerance required for successful completion of functional activities.  - NuStep level 6 (challenging!) using bilateral lower extremities only. Seat setting 12.  For improved extremity mobility, muscular endurance, and activity tolerance; and to induce the analgesic effect of aerobic exercise, stimulate improved joint nutrition, and prepare body structures and systems for following interventions. x 5 minutes. Average SPM = 91 (Manual therapy/dry needling - see above).  - hooklying  bridge with abduction against blue theraband around distal thigh, 5 second holds, 1x20.  - lateral heel tap to 6 inch step 2x10  each side. (More difficult with R side up) - runner's step up forward with U UE support, to 6/8 inch step, 2x10 each side.  - forward and backwards monster walks with red theraband around ankles, 1x20 feet each direction.    Pt required multimodal cuing for proper technique and to facilitate improved neuromuscular control, strength, range of motion, and functional ability resulting in improved performance and form.     HOME EXERCISE PROGRAM Access Code: [email or web address] URL: [email or web address] Date: 12/06/2020 Prepared by: Rosita Kea   Exercises Bridge with Hip Abduction and Resistance - Ground Touches - 1 x daily - 1 sets - 20 reps - 5 seconds hold Isometric hip flexion - 1 x daily - 1 sets - 20 reps - 5 seconds hold Diagonal Hip Extension with Resistance - 1 x daily -  3 sets - 10 reps - 5 seconds hold       PT Education - 12/21/20 1824     Education Details Exercise purpose/form. Self management techniques    Person(s) Educated Patient    Methods Explanation;Demonstration;Tactile cues;Verbal cues    Comprehension Verbalized understanding;Returned demonstration;Verbal cues required;Tactile cues required;Need further instruction              PT Short Term Goals - 12/06/20 1355       PT SHORT TERM GOAL #1   Title Be independent with initial home exercise program for self-management of symptoms.    Baseline Initial HEP provided at visit 1 (12/04/2020);    Time 2    Period Weeks    Status Achieved    Target Date 12/19/20               PT Long Term Goals - 12/05/20 1328       PT LONG TERM GOAL #1   Title Be independent with a long-term home exercise program for self-management of symptoms.    Baseline Initial HEP provided at visit 1 (12/04/2020);    Time 12    Period Weeks    Status New   TARGET DATE FOR ALL LONG TERM GOALS:  02/26/2021     PT LONG TERM GOAL #2   Title Demonstrate improved FOTO to equal or greater than 76 by visit #10 to demonstrate improvement in overall condition and self-reported functional ability.    Baseline 72 (12/04/2020);    Time 12    Period Weeks    Status New      PT LONG TERM GOAL #3   Title Improve B hip strength to equal or greater than 4+/5 with no increase in pain to improve patient's ability to complete functional tasks such as dressing, bed mobility, traveling with less difficulty.    Baseline R hip flexion 2/5 (12/04/2020);    Time 12    Period Weeks    Status New      PT LONG TERM GOAL #4   Title Reduce pain with functional activities to equal or less than 1/10 to allow patient to complete usual activities including dressing, walking, traveling with less difficulty.    Baseline worst 5/10 (12/04/2020);    Time 12    Period Weeks    Status New      PT LONG TERM GOAL #5   Title Complete community, work and/or recreational activities without limitation due to current condition.    Baseline Functional Limitations: sleeping, prolonged sitting, driving/traveling, sitting up, getting in/out of a car, bed mobility, turning over in bed, golfing, walking, dressing (12/04/2020);    Time 12    Period Weeks    Status New                   Plan - 12/21/20 1912     Clinical Impression Statement Patient tolerated treatment well overall and appears to have decreased pain with right hip flexion, possibly as a result of dry needling last session.  Dry needling at the right glute med/min and posterior vastus lateralis reproduced concordant "numbness" at the lateral LE during the procedure. Will follow up with patient next session to further assess effect. Patient continued with LE and functional strengthening targeting the lateral and posterior hip muscles to improve pain, stability, and functional mobility. FOTO score reflects significant improvement in self-reported funciton  (80/100) since intial eval (72/100). However, patient contineus to report pain at night that disrupts  his sleep and difificulty with mobility due to hip pain. Patient would benefit from continued management of limiting condition by skilled physical therapist to address remaining impairments and functional limitations to work towards stated goals and return to PLOF or maximal functional independence.    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 [name]), diabetes with neuropathy, chronic renal insufficiency, stage 3,  cholecystectomy, CAD, GERD, HTN, hypothyroidism, PVD, B knee TKA, R hip THA (2009), L RTC repair (2019), back surgery    Examination-Activity Limitations Bed Mobility;Stairs;Sleep;Sit;Dressing;Locomotion Level;Bend    Examination-Participation Restrictions Community Activity;Driving;Interpersonal Relationship   sleeping, prolonged sitting, driving/traveling, sitting up, getting in/out of a car, bed mobility, turning over in bed, golfing, walking.   Stability/Clinical Decision Making Stable/Uncomplicated    Rehab Potential Good    PT Frequency 2x / week    PT Duration 12 weeks    PT Treatment/Interventions ADLs/Self Care Home Management;Cryotherapy;Moist Heat;Electrical Stimulation;DME Instruction;Gait training;Stair training;Functional mobility training;Therapeutic activities;Therapeutic exercise;Balance training;Neuromuscular re-education;Manual techniques;Dry needling;Joint Manipulations;Spinal Manipulations    PT Next Visit Plan update HEP as appropriate, strengthening as tolerated, manual as appropriate    PT Home Exercise Plan Medbridge Access Code: [email or web address]    [email or web address] and Agree with Plan of Care Patient             Patient will  benefit from skilled therapeutic intervention in order to improve the following deficits and impairments:  Improper body mechanics, Pain, Decreased mobility, Increased muscle spasms, Decreased activity tolerance, Decreased endurance, Decreased range of motion, Decreased strength, Hypomobility, Impaired perceived functional ability, Difficulty walking  Visit Diagnosis: Pain in right hip  Chronic bilateral low back pain, unspecified whether sciatica present  Muscle weakness (generalized)     Problem List Patient Active Problem List   Diagnosis Date Noted   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 Graylon Good, PT, DPT 12/21/20, 7:14 PM   [facility] [address], [place], [address] Phone: [phone]   Fax:  [phone]  Name: [name] [name] MRN: [id number] Date of Birth: 1941-02-25

## 2020-12-26 ENCOUNTER — Ambulatory Visit: Payer: Medicare Other | Admitting: Physical Therapy

## 2020-12-28 ENCOUNTER — Ambulatory Visit: Payer: Medicare Other | Admitting: Physical Therapy

## 2020-12-28 ENCOUNTER — Other Ambulatory Visit: Payer: Self-pay

## 2020-12-28 ENCOUNTER — Encounter: Payer: Self-pay | Admitting: Physical Therapy

## 2020-12-28 DIAGNOSIS — M25551 Pain in right hip: Secondary | ICD-10-CM

## 2020-12-28 DIAGNOSIS — M6281 Muscle weakness (generalized): Secondary | ICD-10-CM

## 2020-12-28 DIAGNOSIS — G8929 Other chronic pain: Secondary | ICD-10-CM

## 2020-12-28 NOTE — Therapy (Signed)
[place] [facility] [address], [place], [address] Phone: [phone]   Fax:  [phone]  Physical Therapy Treatment  Patient Details  Name: [name] [name] MRN: [id number] Date of Birth: 1941/02/24 Referring Provider (PT): Lauris Poag, MD   Encounter Date: 12/28/2020   PT End of Session - 12/28/20 1825     Visit Number 6    Number of Visits 24    Date for PT Re-Evaluation 02/26/21    Authorization Type MEDICARE PART B reporting period from 12/04/2020    Progress Note Due on Visit 10    PT Start Time 1820    PT Stop Time 1900    PT Time Calculation (min) 40 min    Activity Tolerance Patient tolerated treatment well    Behavior During Therapy [facility] for tasks assessed/performed             Past Medical History:  Diagnosis Date   Anemia    Arthritis    CAD (coronary artery disease)    Chronic kidney disease    CKD stage 3   DDD (degenerative disc disease), thoracic    Diabetes mellitus without complication (HCC)    Eczema    GERD (gastroesophageal reflux disease)    Heart murmur    History of dysplastic nevus 12/09/2019   right calf/moderate   History of heart artery stent 2014   Per patient, [name] put in 2 stents in 2014.   History of MRSA infection 02/2013   groin, after PTCA   Hyperlipidemia    Hypertension    Hypothyroidism    Macular degeneration    Myocardial infarction ([place]) 1999   Per Pt, MI in 1999.   PVD (peripheral vascular disease) [facility])     Past Surgical History:  Procedure Laterality Date   BACK SURGERY     BICEPT TENODESIS Left 05/13/2017   Procedure: BICEPS TENODESIS;  Surgeon: [name], MD;  Location: [facility] ORS;  Service: Orthopedics;  Laterality: Left;   CHOLECYSTECTOMY N/A 10/23/2017   Procedure: LAPAROSCOPIC CHOLECYSTECTOMY;  Surgeon: [name], MD;  Location: [facility] ORS;  Service: General;  Laterality: N/A;   CORONARY ANGIOPLASTY     JOINT REPLACEMENT  Bilateral    knee   JOINT REPLACEMENT Right    hip   SHOULDER ARTHROSCOPY WITH OPEN ROTATOR CUFF REPAIR Left 05/13/2017   Procedure: SHOULDER ARTHROSCOPY WITH OPEN ROTATOR CUFF REPAIR;  Surgeon: [name], MD;  Location: [facility] ORS;  Service: Orthopedics;  Laterality: Left;  subacromial decompression, debridement   TONSILLECTOMY     UMBILICAL HERNIA REPAIR  10/23/2017   Procedure: HERNIA REPAIR UMBILICAL ADULT;  Surgeon: [name], MD;  Location: [facility] ORS;  Service: General;;   VASECTOMY      There were no vitals filed for this visit.   Subjective Assessment - 12/28/20 1824     Subjective Patient reports his R hip pain is staying the same. He states it hurts when he moves and especially when he twists it. Does not provide pain rating for this. Reports he was not sore after last sesison. States HEP is going pretty good.  He continues to do his twice weekly [facility] center.    Pertinent History Patient is a 80 y.o. 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 This patient's chief complaints consist of  intermittent right lateral hip and thigh pain and intermittent right groin pain leading to the following functional deficits: difficulty sleeping, prolonged sitting, driving/traveling, sitting up, getting in/out of a car, bed mobility, turning over in bed, golfing.   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 [name]), diabetes with neuropathy, chronic renal insufficiency, stage 3,  cholecystectomy, CAD, GERD, HTN, hypothyroidism, PVD, B knee TKA, R hip THA (2009), L RTC repair (2019), back surgery.  Patient denies hx of cancer, stroke, seizures, lung problem,  unexplained weight loss,  changes in bowel or bladder problems, new onset stumbling or dropping things, neck surgery.    Limitations Sitting;Walking;House hold activities    How long can you walk comfortably? 20 min    Diagnostic tests X-ray Impression 11/22/2020: "Stable appearance to prior right total hip"    Patient Stated Goals "no pain"             TREATMENT:  denies sensitivity to latex   Therapeutic exercise: to centralize symptoms and improve ROM, strength, muscular endurance, and activity tolerance required for successful completion of functional activities.  - NuStep level 6 (challenging) using bilateral lower extremities only. Seat setting 12.  For improved extremity mobility, muscular endurance, and activity tolerance; and to induce the analgesic effect of aerobic exercise, stimulate improved joint nutrition, and prepare body structures and systems for following interventions. x 5 minutes. Average SPM = 100 - standing hip hike on 2x4 board with U UE support, 3x10 each side.  - runner's step up forward with U UE support, to 8 inch step, 3x10 each side.  - standing hip adduction with knee on rolling stool with BUE support, 2x10 each side. Discomfort at right medial knee with R LE supporting.  - suitcase carry with 20#KB, 3x100 feet each side.  - standing chops and scoops with blue theraband, 1x20 each direction, each side.  - forward and backwards monster walks with red theraband around ankles, 2x30 feet each direction. CGA for safety.     Pt required multimodal cuing for proper technique and to facilitate improved neuromuscular control, strength, range of motion, and functional ability resulting in improved performance and form.     HOME EXERCISE PROGRAM Access Code: [email or web address] URL: [email or web address] Date: 12/06/2020 Prepared by: Rosita Kea   Exercises Bridge with Hip Abduction and Resistance - Ground Touches - 1 x daily - 1 sets - 20 reps - 5 seconds hold Isometric hip flexion -  1 x daily - 1 sets - 20 reps - 5 seconds hold Diagonal Hip Extension with Resistance - 1 x daily - 3 sets - 10 reps - 5 seconds hold     PT Education - 12/28/20 1825     Education Details Exercise purpose/form. Self management techniques    Person(s) Educated Patient    Methods Explanation;Demonstration;Tactile cues;Verbal cues    Comprehension Verbalized understanding;Returned demonstration;Verbal cues required;Tactile cues required;Need further instruction              PT Short Term Goals - 12/06/20 1355       PT SHORT TERM GOAL #1   Title Be independent with initial home exercise program for self-management of symptoms.    Baseline Initial HEP provided at visit 1 (12/04/2020);    Time 2    Period Weeks    Status Achieved    Target Date 12/19/20  PT Long Term Goals - 12/05/20 1328       PT LONG TERM GOAL #1   Title Be independent with a long-term home exercise program for self-management of symptoms.    Baseline Initial HEP provided at visit 1 (12/04/2020);    Time 12    Period Weeks    Status New   TARGET DATE FOR ALL LONG TERM GOALS: 02/26/2021     PT LONG TERM GOAL #2   Title Demonstrate improved FOTO to equal or greater than 76 by visit #10 to demonstrate improvement in overall condition and self-reported functional ability.    Baseline 72 (12/04/2020);    Time 12    Period Weeks    Status New      PT LONG TERM GOAL #3   Title Improve B hip strength to equal or greater than 4+/5 with no increase in pain to improve patient's ability to complete functional tasks such as dressing, bed mobility, traveling with less difficulty.    Baseline R hip flexion 2/5 (12/04/2020);    Time 12    Period Weeks    Status New      PT LONG TERM GOAL #4   Title Reduce pain with functional activities to equal or less than 1/10 to allow patient to complete usual activities including dressing, walking, traveling with less difficulty.    Baseline worst 5/10  (12/04/2020);    Time 12    Period Weeks    Status New      PT LONG TERM GOAL #5   Title Complete community, work and/or recreational activities without limitation due to current condition.    Baseline Functional Limitations: sleeping, prolonged sitting, driving/traveling, sitting up, getting in/out of a car, bed mobility, turning over in bed, golfing, walking, dressing (12/04/2020);    Time 12    Period Weeks    Status New                   Plan - 12/28/20 1905     Clinical Impression Statement Patient tolerated treatment well overall but continues to report right groin pain with R hip flexion and medial R knee pain with resisted hip adduction. Continued working on hip and functional strength, aiming to appropriately load muscle-tendons to induce appropriate tissue remodeling. Patient reported no increased pain by end of session. Patient would benefit from continued management of limiting condition by skilled physical therapist to address remaining impairments and functional limitations to work towards stated goals and return to PLOF or maximal functional independence.    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 [name]), diabetes with neuropathy, chronic renal insufficiency, stage 3,  cholecystectomy, CAD, GERD, HTN, hypothyroidism, PVD, B knee TKA, R hip THA (2009), L RTC repair (2019), back surgery    Examination-Activity Limitations Bed Mobility;Stairs;Sleep;Sit;Dressing;Locomotion Level;Bend    Examination-Participation Restrictions Community Activity;Driving;Interpersonal Relationship   sleeping, prolonged sitting, driving/traveling, sitting up, getting in/out of a car, bed mobility, turning over in bed, golfing, walking.    Stability/Clinical Decision Making Stable/Uncomplicated    Rehab Potential Good    PT Frequency 2x / week    PT Duration 12 weeks    PT Treatment/Interventions ADLs/Self Care Home Management;Cryotherapy;Moist Heat;Electrical Stimulation;DME Instruction;Gait training;Stair training;Functional mobility training;Therapeutic activities;Therapeutic exercise;Balance training;Neuromuscular re-education;Manual techniques;Dry needling;Joint Manipulations;Spinal Manipulations    PT Next  Visit Plan update HEP as appropriate, strengthening as tolerated, manual as appropriate    PT Home Exercise Plan Medbridge Access Code: [email or web address]    [email or web address] and Agree with Plan of Care Patient             Patient will benefit from skilled therapeutic intervention in order to improve the following deficits and impairments:  Improper body mechanics, Pain, Decreased mobility, Increased muscle spasms, Decreased activity tolerance, Decreased endurance, Decreased range of motion, Decreased strength, Hypomobility, Impaired perceived functional ability, Difficulty walking  Visit Diagnosis: Pain in right hip  Chronic bilateral low back pain, unspecified whether sciatica present  Muscle weakness (generalized)     Problem List Patient Active Problem List   Diagnosis Date Noted   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 Graylon Good, PT, DPT 12/28/20, 7:05 PM   [place] [facility] [address] [place],  [address] Phone: [phone]   Fax:  [phone]  Name: [name] [name] MRN: [id number] Date of Birth: 1941/01/22

## 2021-01-02 ENCOUNTER — Encounter: Payer: Self-pay | Admitting: Physical Therapy

## 2021-01-02 ENCOUNTER — Ambulatory Visit: Payer: Medicare Other | Attending: Orthopedic Surgery | Admitting: Physical Therapy

## 2021-01-02 DIAGNOSIS — M25551 Pain in right hip: Secondary | ICD-10-CM | POA: Insufficient documentation

## 2021-01-02 DIAGNOSIS — M6281 Muscle weakness (generalized): Secondary | ICD-10-CM | POA: Diagnosis present

## 2021-01-02 DIAGNOSIS — M545 Low back pain, unspecified: Secondary | ICD-10-CM | POA: Insufficient documentation

## 2021-01-02 DIAGNOSIS — G8929 Other chronic pain: Secondary | ICD-10-CM | POA: Diagnosis present

## 2021-01-02 NOTE — Therapy (Signed)
[place] [facility] [address] [place], [place], [address] Phone: [phone]   Fax:  [phone]  Physical Therapy Treatment  Patient Details  Name: [name] [name] MRN: [id number] Date of Birth: 1940/11/16 Referring Provider (PT): Lauris Poag, MD   Encounter Date: 01/02/2021   PT End of Session - 01/02/21 1116     Visit Number 7    Number of Visits 24    Date for PT Re-Evaluation 02/26/21    Authorization Type MEDICARE PART B reporting period from 12/04/2020    Progress Note Due on Visit 10    PT Start Time 1120    PT Stop Time 1200    PT Time Calculation (min) 40 min    Activity Tolerance Patient tolerated treatment well    Behavior During Therapy [facility] for tasks assessed/performed             Past Medical History:  Diagnosis Date   Anemia    Arthritis    CAD (coronary artery disease)    Chronic kidney disease    CKD stage 3   DDD (degenerative disc disease), thoracic    Diabetes mellitus without complication (HCC)    Eczema    GERD (gastroesophageal reflux disease)    Heart murmur    History of dysplastic nevus 12/09/2019   right calf/moderate   History of heart artery stent 2014   Per patient, [name] [name] put in 2 stents in 2014.   History of MRSA infection 02/2013   groin, after PTCA   Hyperlipidemia    Hypertension    Hypothyroidism    Macular degeneration    Myocardial infarction ([place]) 1999   Per Pt, MI in 1999.   PVD (peripheral vascular disease) [facility])     Past Surgical History:  Procedure Laterality Date   BACK SURGERY     BICEPT TENODESIS Left 05/13/2017   Procedure: BICEPS TENODESIS;  Surgeon: [name], MD;  Location: [facility] ORS;  Service: Orthopedics;  Laterality: Left;   CHOLECYSTECTOMY N/A 10/23/2017   Procedure: LAPAROSCOPIC CHOLECYSTECTOMY;  Surgeon: [name], MD;  Location: [facility] ORS;  Service: General;  Laterality: N/A;   CORONARY ANGIOPLASTY     JOINT REPLACEMENT  Bilateral    knee   JOINT REPLACEMENT Right    hip   SHOULDER ARTHROSCOPY WITH OPEN ROTATOR CUFF REPAIR Left 05/13/2017   Procedure: SHOULDER ARTHROSCOPY WITH OPEN ROTATOR CUFF REPAIR;  Surgeon: [name], MD;  Location: [facility] ORS;  Service: Orthopedics;  Laterality: Left;  subacromial decompression, debridement   TONSILLECTOMY     UMBILICAL HERNIA REPAIR  10/23/2017   Procedure: HERNIA REPAIR UMBILICAL ADULT;  Surgeon: [name], MD;  Location: [facility] ORS;  Service: General;;   VASECTOMY      There were no vitals filed for this visit.   Subjective Assessment - 01/02/21 1118     Subjective Patient reports he continues to have the same soreness in his right hip. The whole leg goes numb if he sits too long and and he has pain near his right groin when he actively flexes his R hip. States he did not have increased soreness following last PT session. HEP is going well.    Pertinent History Patient is a 80 y.o. 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 This patient's chief complaints consist of intermittent right lateral hip  and thigh pain and intermittent right groin pain leading to the following functional deficits: difficulty sleeping, prolonged sitting, driving/traveling, sitting up, getting in/out of a car, bed mobility, turning over in bed, golfing.   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 Nehemiah [name]), diabetes with neuropathy, chronic renal insufficiency, stage 3,  cholecystectomy, CAD, GERD, HTN, hypothyroidism, PVD, B knee TKA, R hip THA (2009), L RTC repair (2019), back surgery.  Patient denies hx of cancer, stroke, seizures, lung problem,  unexplained weight loss, changes in bowel or bladder  problems, new onset stumbling or dropping things, neck surgery.    Limitations Sitting;Walking;House hold activities    How long can you walk comfortably? 20 min    Diagnostic tests X-ray Impression 11/22/2020: "Stable appearance to prior right total hip"    Patient Stated Goals "no pain"    Currently in Pain? No/denies                 TREATMENT:  denies sensitivity to latex + R THA, denies left THA + lumbar surgery for stenosis (denies fusion)   Therapeutic exercise: to centralize symptoms and improve ROM, strength, muscular endurance, and activity tolerance required for successful completion of functional activities.  - NuStep level 6-7 (challenging) using bilateral lower extremities only. Seat setting 12. For improved extremity mobility, muscular endurance, and activity tolerance; and to induce the analgesic effect of aerobic exercise, stimulate improved joint nutrition, and prepare body structures and systems for following interventions. x 5 minutes. Average SPM = 93 - hooklying LTR, 1x20 each direction - hooklying LTR with green theraball under legs, 1x20 each direction.  - hooklying DKTC with green theraball under feet, 1x20 (mild soreness at R hip, note some tremor in the LEs).  - Quadruped bird dog (alternating shoulder flexion/contralateral hip extension with core muscles braced), cuing for TrA contraction, 2x5 each side with 5 second hold assisted by metronome to count time. (Does bother knees slightly). - standing modified mountain climbers with 1 second hold, 2x10 with hands on high plinth.  - standing hip adduction with knee on rolling stool with BUE support, 3x10 each side. Mild discomfort at right medial knee with R LE supporting.  - standing hip abduction at hip machine, 3x10 each side, 25lbs, arm on position 5   Manual therapy: to reduce pain and tissue tension, improve range of motion, neuromodulation, in order to promote improved ability to complete functional  activities. - hooklying LAD through L hip, 3x30 seconds   Pt required multimodal cuing for proper technique and to facilitate improved neuromuscular control, strength, range of motion, and functional ability resulting in improved performance and form.      HOME EXERCISE PROGRAM Access Code: [email or web address] URL: [email or web address] Date: 12/06/2020 Prepared by: Rosita Kea   Exercises Bridge with Hip Abduction and Resistance - Ground Touches - 1 x daily - 1 sets - 20 reps - 5 seconds hold Isometric hip flexion - 1 x daily - 1 sets - 20 reps - 5 seconds hold Diagonal Hip Extension with Resistance - 1 x daily - 3 sets - 10 reps - 5 seconds hold    PT Education - 01/02/21 1116     Education Details Exercise purpose/form. Self management techniques    Person(s) Educated Patient    Methods Explanation;Demonstration;Tactile cues;Verbal cues    Comprehension Verbalized understanding;Returned demonstration;Verbal cues required;Tactile cues required;Need further instruction  PT Short Term Goals - 12/06/20 1355       PT SHORT TERM GOAL #1   Title Be independent with initial home exercise program for self-management of symptoms.    Baseline Initial HEP provided at visit 1 (12/04/2020);    Time 2    Period Weeks    Status Achieved    Target Date 12/19/20               PT Long Term Goals - 12/05/20 1328       PT LONG TERM GOAL #1   Title Be independent with a long-term home exercise program for self-management of symptoms.    Baseline Initial HEP provided at visit 1 (12/04/2020);    Time 12    Period Weeks    Status New   TARGET DATE FOR ALL LONG TERM GOALS: 02/26/2021     PT LONG TERM GOAL #2   Title Demonstrate improved FOTO to equal or greater than 76 by visit #10 to demonstrate improvement in overall condition and self-reported functional ability.    Baseline 72 (12/04/2020);    Time 12    Period Weeks    Status New      PT LONG TERM GOAL #3    Title Improve B hip strength to equal or greater than 4+/5 with no increase in pain to improve patient's ability to complete functional tasks such as dressing, bed mobility, traveling with less difficulty.    Baseline R hip flexion 2/5 (12/04/2020);    Time 12    Period Weeks    Status New      PT LONG TERM GOAL #4   Title Reduce pain with functional activities to equal or less than 1/10 to allow patient to complete usual activities including dressing, walking, traveling with less difficulty.    Baseline worst 5/10 (12/04/2020);    Time 12    Period Weeks    Status New      PT LONG TERM GOAL #5   Title Complete community, work and/or recreational activities without limitation due to current condition.    Baseline Functional Limitations: sleeping, prolonged sitting, driving/traveling, sitting up, getting in/out of a car, bed mobility, turning over in bed, golfing, walking, dressing (12/04/2020);    Time 12    Period Weeks    Status New                   Plan - 01/02/21 1156     Clinical Impression Statement Patient continues to have similar pain despite interventions so far. Included more interventions to address possible lumbar contribution despite being unable to confirm this at initial exam. Continued to work on gentle loading of painful movements and hip strengthening as well. Patient continues to report decreased quality of life and functional limitations due to his condition. Patient would benefit from continued management of limiting condition by skilled physical therapist to address remaining impairments and functional limitations to work towards stated goals and return to PLOF or maximal functional independence.    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 Nehemiah [name]), diabetes with neuropathy, chronic renal insufficiency, stage 3,  cholecystectomy, CAD, GERD, HTN, hypothyroidism, PVD, B knee TKA, R hip THA (2009), L RTC repair (2019), back surgery    Examination-Activity Limitations Bed Mobility;Stairs;Sleep;Sit;Dressing;Locomotion Level;[employer]  Examination-Participation Restrictions Community Activity;Driving;Interpersonal Relationship   sleeping, prolonged sitting, driving/traveling, sitting up, getting in/out of a car, bed mobility, turning over in bed, golfing, walking.   Stability/Clinical Decision Making Stable/Uncomplicated    Rehab Potential Good    PT Frequency 2x / week    PT Duration 12 weeks    PT Treatment/Interventions ADLs/Self Care Home Management;Cryotherapy;Moist Heat;Electrical Stimulation;DME Instruction;Gait training;Stair training;Functional mobility training;Therapeutic activities;Therapeutic exercise;Balance training;Neuromuscular re-education;Manual techniques;Dry needling;Joint Manipulations;Spinal Manipulations    PT Next Visit Plan update HEP as appropriate, strengthening as tolerated, manual as appropriate    PT Home Exercise Plan Medbridge Access Code: [email or web address]    [email or web address] and Agree with Plan of Care Patient             Patient will benefit from skilled therapeutic intervention in order to improve the following deficits and impairments:  Improper body mechanics, Pain, Decreased mobility, Increased muscle spasms, Decreased activity tolerance, Decreased endurance, Decreased range of motion, Decreased strength, Hypomobility, Impaired perceived functional ability, Difficulty walking  Visit Diagnosis: Pain in right hip  Chronic bilateral low back pain, unspecified whether sciatica present  Muscle weakness (generalized)     Problem List Patient Active Problem List   Diagnosis Date Noted   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 Graylon Good, PT, DPT 01/02/21, 12:05 PM   [place] [facility] [address] [place], [address] Phone: [phone]   Fax:  [phone]  Name: [name] [name] MRN: [id number] Date of Birth: 29-Dec-1940

## 2021-01-04 ENCOUNTER — Ambulatory Visit: Payer: Medicare Other | Admitting: Physical Therapy

## 2021-01-04 ENCOUNTER — Other Ambulatory Visit: Payer: Self-pay

## 2021-01-04 ENCOUNTER — Encounter: Payer: Self-pay | Admitting: Physical Therapy

## 2021-01-04 ENCOUNTER — Ambulatory Visit (INDEPENDENT_AMBULATORY_CARE_PROVIDER_SITE_OTHER): Payer: Medicare Other | Admitting: Dermatology

## 2021-01-04 DIAGNOSIS — Z1283 Encounter for screening for malignant neoplasm of skin: Secondary | ICD-10-CM

## 2021-01-04 DIAGNOSIS — D485 Neoplasm of uncertain behavior of skin: Secondary | ICD-10-CM

## 2021-01-04 DIAGNOSIS — L570 Actinic keratosis: Secondary | ICD-10-CM | POA: Diagnosis not present

## 2021-01-04 DIAGNOSIS — M6281 Muscle weakness (generalized): Secondary | ICD-10-CM

## 2021-01-04 DIAGNOSIS — L578 Other skin changes due to chronic exposure to nonionizing radiation: Secondary | ICD-10-CM

## 2021-01-04 DIAGNOSIS — L821 Other seborrheic keratosis: Secondary | ICD-10-CM

## 2021-01-04 DIAGNOSIS — D1801 Hemangioma of skin and subcutaneous tissue: Secondary | ICD-10-CM

## 2021-01-04 DIAGNOSIS — D229 Melanocytic nevi, unspecified: Secondary | ICD-10-CM

## 2021-01-04 DIAGNOSIS — G8929 Other chronic pain: Secondary | ICD-10-CM

## 2021-01-04 DIAGNOSIS — I872 Venous insufficiency (chronic) (peripheral): Secondary | ICD-10-CM | POA: Diagnosis not present

## 2021-01-04 DIAGNOSIS — M25551 Pain in right hip: Secondary | ICD-10-CM

## 2021-01-04 DIAGNOSIS — L308 Other specified dermatitis: Secondary | ICD-10-CM | POA: Diagnosis not present

## 2021-01-04 DIAGNOSIS — L814 Other melanin hyperpigmentation: Secondary | ICD-10-CM

## 2021-01-04 DIAGNOSIS — M545 Low back pain, unspecified: Secondary | ICD-10-CM

## 2021-01-04 DIAGNOSIS — Z86018 Personal history of other benign neoplasm: Secondary | ICD-10-CM

## 2021-01-04 NOTE — Patient Instructions (Addendum)
Cryotherapy Aftercare  Wash gently with soap and water everyday.   Apply Vaseline and Band-Aid daily until healed.   Prior to procedure, discussed risks of blister formation, small wound, skin dyspigmentation, or rare scar following cryotherapy. Recommend Vaseline ointment to treated areas while healing.  Start tacrolimus twice daily to affected areas of eczema as needed.   Melanoma ABCDEs  Melanoma is the most dangerous type of skin cancer, and is the leading cause of death from skin disease.  You are more likely to develop melanoma if you: Have light-colored skin, light-colored eyes, or red or blond hair Spend a lot of time in the sun Tan regularly, either outdoors or in a tanning bed Have had blistering sunburns, especially during childhood Have a close family member who has had a melanoma Have atypical moles or large birthmarks  Early detection of melanoma is key since treatment is typically straightforward and cure rates are extremely high if we catch it early.   The first sign of melanoma is often a change in a mole or a new dark spot.  The ABCDE system is a way of remembering the signs of melanoma.  A for asymmetry:  The two halves do not match. B for border:  The edges of the growth are irregular. C for color:  A mixture of colors are present instead of an even brown color. D for diameter:  Melanomas are usually (but not always) greater than 66mm - the size of a pencil eraser. E for evolution:  The spot keeps changing in size, shape, and color.  Please check your skin once per month between visits. You can use a small mirror in front and a large mirror behind you to keep an eye on the back side or your body.   If you see any new or changing lesions before your next follow-up, please call to schedule a visit.  Please continue daily skin protection including broad spectrum sunscreen SPF 30+ to sun-exposed areas, reapplying every 2 hours as needed when you're outdoors.    If  you have any questions or concerns for your doctor, please call our main line at [phone] and press option 4 to reach your doctor's medical assistant. If no one answers, please leave a voicemail as directed and we will return your call as soon as possible. Messages left after 4 pm will be answered the following business day.   You may also send [place] a message via [place]. We typically respond to MyChart messages within 1-2 business days.  For prescription refills, please ask your pharmacy to contact our office. Our fax number is [phone].  If you have an urgent issue when the clinic is closed that cannot wait until the next business day, you can page your doctor at the number below.    Please note that while we do our best to be available for urgent issues outside of office hours, we are not available 24/7.   If you have an urgent issue and are unable to reach [place], you may choose to seek medical care at your doctor's office, retail clinic, urgent care center, or emergency room.  If you have a medical emergency, please immediately call 911 or go to the emergency department.  Pager Numbers  - Dr. [name]: [phone]  - Dr. Laurence Ferrari: [phone]  - Dr. Nicole Kindred: [phone]  In the event of inclement weather, please call our main line at [phone] for an update on the status of any delays or closures.  Dermatology Medication Tips:  Please keep the boxes that topical medications come in in order to help keep track of the instructions about where and how to use these. Pharmacies typically print the medication instructions only on the boxes and not directly on the medication tubes.   If your medication is too expensive, please contact our office at [phone] option 4 or send [place] a message through [place].   We are unable to tell what your co-pay for medications will be in advance as this is different depending on your insurance coverage. However, we may be able to find a substitute  medication at lower cost or fill out paperwork to get insurance to cover a needed medication.   If a prior authorization is required to get your medication covered by your insurance company, please allow [place] 1-2 business days to complete this process.  Drug prices often vary depending on where the prescription is filled and some pharmacies may offer cheaper prices.  The website www.goodrx.com contains coupons for medications through different pharmacies. The prices here do not account for what the cost may be with help from insurance (it may be cheaper with your insurance), but the website can give you the price if you did not use any insurance.  - You can print the associated coupon and take it with your prescription to the pharmacy.  - You may also stop by our office during regular business hours and pick up a GoodRx coupon card.  - If you need your prescription sent electronically to a different pharmacy, notify our office through [facility] or by phone at [phone] option 4.

## 2021-01-04 NOTE — Therapy (Signed)
[facility] [address], [place], [address] Phone: [phone]   Fax:  [phone]  Physical Therapy Treatment  Patient Details  Name: Raheim Beutler MRN: [id number] Date of Birth: 12-20-1940 Referring Provider (PT): Lauris Poag, MD   Encounter Date: 01/04/2021   PT End of Session - 01/04/21 1129     Visit Number 8    Number of Visits 24    Date for PT Re-Evaluation 02/26/21    Authorization Type MEDICARE PART B reporting period from 12/04/2020    Progress Note Due on Visit 10    PT Start Time 1125    PT Stop Time 1205    PT Time Calculation (min) 40 min    Activity Tolerance Patient tolerated treatment well    Behavior During Therapy [facility] for tasks assessed/performed             Past Medical History:  Diagnosis Date   Anemia    Arthritis    CAD (coronary artery disease)    Chronic kidney disease    CKD stage 3   DDD (degenerative disc disease), thoracic    Diabetes mellitus without complication (HCC)    Eczema    GERD (gastroesophageal reflux disease)    Heart murmur    History of dysplastic nevus 12/09/2019   right calf/moderate   History of heart artery stent 2014   Per patient, [name] put in 2 stents in 2014.   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   PVD (peripheral vascular disease) [facility])     Past Surgical History:  Procedure Laterality Date   BACK SURGERY     BICEPT TENODESIS Left 05/13/2017   Procedure: BICEPS TENODESIS;  Surgeon: [name], MD;  Location: [facility] ORS;  Service: Orthopedics;  Laterality: Left;   CHOLECYSTECTOMY N/A 10/23/2017   Procedure: LAPAROSCOPIC CHOLECYSTECTOMY;  Surgeon: [name], MD;  Location: [facility] ORS;  Service: General;  Laterality: N/A;   CORONARY ANGIOPLASTY     JOINT REPLACEMENT  Bilateral    knee   JOINT REPLACEMENT Right    hip   SHOULDER ARTHROSCOPY WITH OPEN ROTATOR CUFF REPAIR Left 05/13/2017   Procedure: SHOULDER ARTHROSCOPY WITH OPEN ROTATOR CUFF REPAIR;  Surgeon: [name], MD;  Location: [facility] ORS;  Service: Orthopedics;  Laterality: Left;  subacromial decompression, debridement   TONSILLECTOMY     UMBILICAL HERNIA REPAIR  10/23/2017   Procedure: HERNIA REPAIR UMBILICAL ADULT;  Surgeon: [name], MD;  Location: [facility] ORS;  Service: General;;   VASECTOMY      There were no vitals filed for this visit.   Subjective Assessment - 01/04/21 1128     Subjective Patient reports his right hip is sore when he flexes it activley up to 3/10 this morning. States he was pretty sore there yesterday after his PT appointment the day before.    Pertinent History Patient is a 80 y.o. 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 This patient's chief complaints consist of intermittent right lateral hip and thigh pain and intermittent right groin pain leading to the following functional deficits: difficulty sleeping, prolonged sitting, driving/traveling, sitting up, getting in/out  of a car, bed mobility, turning over in bed, golfing.   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 [name]), diabetes with neuropathy, chronic renal insufficiency, stage 3,  cholecystectomy, CAD, GERD, HTN, hypothyroidism, PVD, B knee TKA, R hip THA (2009), L RTC repair (2019), back surgery.  Patient denies hx of cancer, stroke, seizures, lung problem,  unexplained weight loss, changes in bowel or bladder problems, new onset stumbling or dropping things, neck surgery.    Limitations Sitting;Walking;House hold  activities    How long can you walk comfortably? 20 min    Diagnostic tests X-ray Impression 11/22/2020: "Stable appearance to prior right total hip"    Patient Stated Goals "no pain"    Currently in Pain? Yes    Pain Score 3               TREATMENT:  denies sensitivity to latex + R THA, denies left THA + lumbar surgery for stenosis (denies fusion)   Therapeutic exercise: to centralize symptoms and improve ROM, strength, muscular endurance, and activity tolerance required for successful completion of functional activities.  - NuStep level 6-7 (challenging) using bilateral lower extremities only. Seat setting 12. For improved extremity mobility, muscular endurance, and activity tolerance; and to induce the analgesic effect of aerobic exercise, stimulate improved joint nutrition, and prepare body structures and systems for following interventions. x 5 minutes. Average SPM = 88 - hooklying LTR, 1x20 each direction - hooklying LTR with green theraball under legs, 1x20 each direction.  - hooklying DKTC with green theraball under feet, 1x20 (mild soreness at R hip, note some tremor in the LEs).  - hooklying hip adduction against green ball, 1x20 with 4 second holds assisted by metronome - Quadruped bird dog (alternating shoulder flexion/contralateral hip extension with core muscles braced), cuing for TrA contraction, 3x5 each side with 5 second hold assisted by metronome to count time. (Difficulty supporting with R UE at end of last set).  - standing hip adduction with knee on rolling stool with BUE support, 3x10 each side.  - standing hip abduction at hip machine, 3x10 each side, 40lbs, arm on position   Manual therapy: to reduce pain and tissue tension, improve range of motion, neuromodulation, in order to promote improved ability to complete functional activities. HOOOKLYING - STM to right hip adductors (concordant tenderness noted).   Modality: (unbilled) Dry needling performed to  right hip adductors to decrease pain and spasms along patient's groin region with patient in hooklying utilizing (1) dry needle(s) .70mm x 57mm with (2) sticks at right hip adductor group . Patient educated about the risks and benefits from therapy and verbally consents to treatment.  Dry needling performed by Everlean Alstrom. Graylon Good PT, DPT who is certified in this technique.  Pt required multimodal cuing for proper technique and to facilitate improved neuromuscular control, strength, range of motion, and functional ability resulting in improved performance and form.       HOME EXERCISE PROGRAM Access Code: [email or web address] URL: [email or web address] Date: 12/06/2020 Prepared by: Rosita Kea   Exercises Bridge with Hip Abduction and Resistance - Ground Touches - 1 x daily - 1 sets - 20 reps - 5 seconds hold Isometric hip flexion - 1 x daily - 1 sets - 20 reps - 5 seconds hold Diagonal Hip Extension with Resistance - 1 x daily - 3 sets - 10 reps - 5 seconds  hold    PT Education - 01/04/21 1129     Education Details Exercise purpose/form. Self management techniques    Person(s) Educated Patient    Methods Explanation;Demonstration;Tactile cues;Verbal cues    Comprehension Verbalized understanding;Returned demonstration;Verbal cues required;Tactile cues required;Need further instruction              PT Short Term Goals - 12/06/20 1355       PT SHORT TERM GOAL #1   Title Be independent with initial home exercise program for self-management of symptoms.    Baseline Initial HEP provided at visit 1 (12/04/2020);    Time 2    Period Weeks    Status Achieved    Target Date 12/19/20               PT Long Term Goals - 12/05/20 1328       PT LONG TERM GOAL #1   Title Be independent with a long-term home exercise program for self-management of symptoms.    Baseline Initial HEP provided at visit 1 (12/04/2020);    Time 12    Period Weeks    Status New   TARGET DATE FOR ALL  LONG TERM GOALS: 02/26/2021     PT LONG TERM GOAL #2   Title Demonstrate improved FOTO to equal or greater than 76 by visit #10 to demonstrate improvement in overall condition and self-reported functional ability.    Baseline 72 (12/04/2020);    Time 12    Period Weeks    Status New      PT LONG TERM GOAL #3   Title Improve B hip strength to equal or greater than 4+/5 with no increase in pain to improve patient's ability to complete functional tasks such as dressing, bed mobility, traveling with less difficulty.    Baseline R hip flexion 2/5 (12/04/2020);    Time 12    Period Weeks    Status New      PT LONG TERM GOAL #4   Title Reduce pain with functional activities to equal or less than 1/10 to allow patient to complete usual activities including dressing, walking, traveling with less difficulty.    Baseline worst 5/10 (12/04/2020);    Time 12    Period Weeks    Status New      PT LONG TERM GOAL #5   Title Complete community, work and/or recreational activities without limitation due to current condition.    Baseline Functional Limitations: sleeping, prolonged sitting, driving/traveling, sitting up, getting in/out of a car, bed mobility, turning over in bed, golfing, walking, dressing (12/04/2020);    Time 12    Period Weeks    Status New                   Plan - 01/04/21 1202     Clinical Impression Statement Pateint tolerated treatment well overall with some difficulty due to R UE fatigue with bird dogs. Decreased exercises with active hip flexion due to reports of increased soreness there following last session. Continued working on hip strength and lumbar stabilization. Utilized dry needling at R hip adductors due to concordant pain in this region with manual therapy. Patient would benefit from continued management of limiting condition by skilled physical therapist to address remaining impairments and functional limitations to work towards stated goals and return to PLOF  or maximal functional independence.    Personal Factors and Comorbidities Age;Past/Current Experience;Comorbidity 3+    Comorbidities Relevant past medical history and comorbidities include diabetic  neuropathy (mostly notices in feet), macular degeneration (L eye), MI, aneurysm of thoracic aorta (stents placed and followed by  Dr. [name]), diabetes with neuropathy, chronic renal insufficiency, stage 3,  cholecystectomy, CAD, GERD, HTN, hypothyroidism, PVD, B knee TKA, R hip THA (2009), L RTC repair (2019), back surgery    Examination-Activity Limitations Bed Mobility;Stairs;Sleep;Sit;Dressing;Locomotion Level;Bend    Examination-Participation Restrictions Community Activity;Driving;Interpersonal Relationship   sleeping, prolonged sitting, driving/traveling, sitting up, getting in/out of a car, bed mobility, turning over in bed, golfing, walking.   Stability/Clinical Decision Making Stable/Uncomplicated    Rehab Potential Good    PT Frequency 2x / week    PT Duration 12 weeks    PT Treatment/Interventions ADLs/Self Care Home Management;Cryotherapy;Moist Heat;Electrical Stimulation;DME Instruction;Gait training;Stair training;Functional mobility training;Therapeutic activities;Therapeutic exercise;Balance training;Neuromuscular re-education;Manual techniques;Dry needling;Joint Manipulations;Spinal Manipulations    PT Next Visit Plan update HEP as appropriate, strengthening as tolerated, manual as appropriate    PT Home Exercise Plan Medbridge Access Code: [email or web address]    [email or web address] and Agree with Plan of Care Patient             Patient will benefit from skilled therapeutic intervention in order to improve the following deficits and impairments:  Improper body mechanics, Pain, Decreased mobility, Increased muscle spasms, Decreased activity tolerance, Decreased endurance, Decreased range of motion, Decreased strength, Hypomobility, Impaired perceived functional ability, Difficulty walking  Visit  Diagnosis: Pain in right hip  Chronic bilateral low back pain, unspecified whether sciatica present  Muscle weakness (generalized)     Problem List Patient Active Problem List   Diagnosis Date Noted   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 Graylon Good, PT, DPT 01/04/21, 12:10 PM   [facility] [address], [place], [address] Phone: [phone]   Fax:  [phone]  Name: Bryston Colocho MRN: [id number] Date of Birth: 11-Aug-1940

## 2021-01-04 NOTE — Progress Notes (Signed)
Follow-Up Visit   Subjective  [name] [name] is a 80 y.o. male who presents for the following: FBSE (Patient here for full body skin exam and skin cancer screening. Patient with hx of dysplastic nevi. Patient does have a spot at each shoulder that itch and have been present for a few months. ).  Patient being followed for stasis and is currently using tacrolimus and wearing compression. Patient advises his legs do itch sometimes but overall are doing well.   The following portions of the chart were reviewed this encounter and updated as appropriate:   Tobacco  Allergies  Meds  Problems  Med Hx  Surg Hx  Fam Hx      Review of Systems:  No other skin or systemic complaints except as noted in HPI or Assessment and Plan.  Objective  Well appearing patient in no apparent distress; mood and affect are within normal limits.  A full examination was performed including scalp, head, eyes, ears, nose, lips, neck, chest, axillae, abdomen, back, buttocks, bilateral upper extremities, bilateral lower extremities, hands, feet, fingers, toes, fingernails, and toenails. All findings within normal limits unless otherwise noted below.  bilateral lower legs   Mid Scalp x 1 Erythematous thin papules/macules with gritty scale.   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 Currently well-controlled.  Continue tacrolimus and compression as directed.   AK (actinic keratosis) Mid Scalp x 1  Hypertrophic at mid scalp   Prior to procedure, discussed risks of blister formation, small wound, skin dyspigmentation, or rare scar following cryotherapy. Recommend Vaseline ointment to treated areas while healing.  Actinic keratoses are precancerous spots that appear  secondary to cumulative UV radiation exposure/sun exposure over time. They are chronic with expected duration over 1 year. A portion of actinic keratoses will progress to squamous cell carcinoma of the skin. It is not possible to reliably predict which spots will progress to skin cancer and so treatment is recommended to prevent development of skin cancer.  Recommend daily broad spectrum sunscreen SPF 30+ to sun-exposed areas, reapply every 2 hours as needed.  Recommend staying in the shade or wearing long sleeves, sun glasses (UVA+UVB protection) and wide brim hats (4-inch brim around the entire circumference of the hat). Call for new or changing lesions.   Destruction of lesion - Mid Scalp x 1  Destruction method: cryotherapy   Informed consent: discussed and consent obtained   Lesion destroyed using liquid nitrogen: Yes   Cryotherapy cycles:  2 Outcome: patient tolerated procedure well with no complications   Post-procedure details: wound care instructions given    Neoplasm of uncertain behavior of skin right cheek  Favor inflamed lentigo or SK with pigment incontinence at right cheek, recheck on follow up. Call for changes.    Other eczema back, arms, legs  Start tacrolimus twice daily to affected areas of eczema as needed.   Lentigines - Scattered tan macules - Due to sun exposure - Benign-appearing, observe - Recommend daily broad spectrum sunscreen SPF 30+ to sun-exposed areas, reapply every 2 hours as needed. - Call for any changes  Seborrheic Keratoses - Stuck-on, waxy, tan-brown papules and/or plaques  - Benign-appearing - Discussed benign etiology and prognosis. - Observe - Call for any changes  Melanocytic Nevi - Tan-brown and/or pink-flesh-colored  symmetric macules and papules - 0.3 cm dark brown thin papule at right calf - Benign appearing on exam today - Observation - Call clinic for new or changing moles - Recommend daily use of broad spectrum spf 30+  sunscreen to sun-exposed areas.   Hemangiomas - Red papules - Discussed benign nature - Observe - Call for any changes  Actinic Damage - Chronic condition, secondary to cumulative UV/sun exposure - diffuse scaly erythematous macules with underlying dyspigmentation - Recommend daily broad spectrum sunscreen SPF 30+ to sun-exposed areas, reapply every 2 hours as needed.  - Staying in the shade or wearing long sleeves, sun glasses (UVA+UVB protection) and wide brim hats (4-inch brim around the entire circumference of the hat) are also recommended for sun protection.  - Call for new or changing lesions.  Skin cancer screening performed today.  History of Dysplastic Nevi - No evidence of recurrence today - Recommend regular full body skin exams - Recommend daily broad spectrum sunscreen SPF 30+ to sun-exposed areas, reapply every 2 hours as needed.  - Call if any new or changing lesions are noted between office visits  Return in about 2 months (around 03/06/2021) for AK follow up, recheck right cheek and TBSE 1 year.  Graciella Belton, RMA, am acting as scribe for Forest Gleason, MD .  Documentation: I have reviewed the above documentation for accuracy and completeness, and I agree with the above.  Forest Gleason, MD

## 2021-01-09 ENCOUNTER — Encounter: Payer: Medicare Other | Admitting: Physical Therapy

## 2021-01-10 ENCOUNTER — Encounter: Payer: Self-pay | Admitting: Dermatology

## 2021-01-11 ENCOUNTER — Encounter: Payer: Medicare Other | Admitting: Physical Therapy

## 2021-01-16 ENCOUNTER — Ambulatory Visit: Payer: Medicare Other | Admitting: Physical Therapy

## 2021-01-18 ENCOUNTER — Ambulatory Visit: Payer: Medicare Other | Admitting: Physical Therapy

## 2021-01-18 DIAGNOSIS — M25551 Pain in right hip: Secondary | ICD-10-CM

## 2021-01-18 DIAGNOSIS — M6281 Muscle weakness (generalized): Secondary | ICD-10-CM

## 2021-01-18 DIAGNOSIS — G8929 Other chronic pain: Secondary | ICD-10-CM

## 2021-01-18 NOTE — Therapy (Signed)
[facility] [address], [place], [address] Phone: [phone]   Fax:  [phone]  Physical Therapy Treatment  Patient Details  Name: Castulo Scarpelli MRN: [id number] Date of Birth: April 22, 1940 Referring Provider (PT): Lauris Poag, MD   Encounter Date: 01/18/2021   PT End of Session - 01/18/21 1117     Visit Number 9    Number of Visits 24    Date for PT Re-Evaluation 02/26/21    Authorization Type MEDICARE PART B reporting period from 12/04/2020    Progress Note Due on Visit 10    PT Start Time 1111    PT Stop Time 1151    PT Time Calculation (min) 40 min    Activity Tolerance Patient tolerated treatment well    Behavior During Therapy [facility] for tasks assessed/performed             Past Medical History:  Diagnosis Date   Anemia    Arthritis    CAD (coronary artery disease)    Chronic kidney disease    CKD stage 3   DDD (degenerative disc disease), thoracic    Diabetes mellitus without complication (HCC)    Eczema    GERD (gastroesophageal reflux disease)    Heart murmur    History of dysplastic nevus 12/09/2019   right calf/moderate   History of heart artery stent 2014   Per patient, [name] put in 2 stents in 2014.   History of MRSA infection 02/2013   groin, after PTCA   Hyperlipidemia    Hypertension    Hypothyroidism    Macular degeneration    Myocardial infarction ([place]) 1999   Per Pt, MI in 1999.   PVD (peripheral vascular disease) [facility])     Past Surgical History:  Procedure Laterality Date   BACK SURGERY     BICEPT TENODESIS Left 05/13/2017   Procedure: BICEPS TENODESIS;  Surgeon: [name], MD;  Location: [facility] ORS;  Service: Orthopedics;  Laterality: Left;   CHOLECYSTECTOMY N/A 10/23/2017   Procedure: LAPAROSCOPIC CHOLECYSTECTOMY;  Surgeon: [name], MD;  Location: [facility] ORS;  Service: General;  Laterality: N/A;   CORONARY ANGIOPLASTY     JOINT REPLACEMENT  Bilateral    knee   JOINT REPLACEMENT Right    hip   SHOULDER ARTHROSCOPY WITH OPEN ROTATOR CUFF REPAIR Left 05/13/2017   Procedure: SHOULDER ARTHROSCOPY WITH OPEN ROTATOR CUFF REPAIR;  Surgeon: [name], MD;  Location: [facility] ORS;  Service: Orthopedics;  Laterality: Left;  subacromial decompression, debridement   TONSILLECTOMY     UMBILICAL HERNIA REPAIR  10/23/2017   Procedure: HERNIA REPAIR UMBILICAL ADULT;  Surgeon: [name], MD;  Location: [facility] ORS;  Service: General;;   VASECTOMY      There were no vitals filed for this visit.   Subjective Assessment - 01/18/21 1112     Subjective Pateint reports he feels a little worse after he spent all week walking and moving furnature. This increased his pain so that he felt he could not come to his PT appointment earlier this week. He also had some back pain after standing and working at a table and down the right leg on the outside of the leg.  Rates his pain as 3/10 in his right hip adductor region. He only feels it here when he uses it. He cannot tell that the dry needling performed at last PT session helped at all. Patient sometimes feels like he needs to  stop doing anything for a while to allow his leg to "cure itself." He is in the process of cakemaking now. He has been doing some of his HEP and continues to participate in exercise class at senior center.    Pertinent History Patient is a 80 y.o. 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 This patient's chief complaints consist of intermittent right lateral hip and thigh pain and intermittent right groin pain leading to the following functional deficits: difficulty sleeping, prolonged sitting, driving/traveling, sitting up, getting in/out of a car, bed mobility, turning over in bed, golfing.   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 [name]), diabetes with neuropathy, chronic renal insufficiency, stage 3,  cholecystectomy, CAD, GERD, HTN, hypothyroidism, PVD, B knee TKA, R hip THA (2009), L RTC repair (2019), back surgery.  Patient denies hx of cancer, stroke, seizures, lung problem,  unexplained weight loss, changes in bowel or bladder problems, new onset stumbling or dropping things, neck surgery.    Limitations Sitting;Walking;House hold activities    How long can you walk comfortably? 20 min    Diagnostic tests X-ray Impression 11/22/2020: "Stable appearance to prior right total hip"    Patient Stated Goals "no pain"    Currently in Pain? Yes    Pain Score 3               TREATMENT:  denies sensitivity to latex + R THA, denies left THA + lumbar surgery for stenosis (denies fusion)   Therapeutic exercise: to centralize symptoms and improve ROM, strength, muscular endurance, and activity tolerance required for successful completion of functional activities.  - NuStep level 6 using bilateral lower extremities only. Seat setting 12. For improved extremity mobility, muscular endurance, and activity tolerance; and to induce the analgesic effect of aerobic exercise, stimulate improved joint nutrition, and prepare body structures and systems for following interventions. x 5 minutes. Average SPM = 104 - hooklying DKTC with green theraball under feet, 1x20 (mild soreness at R hip, note some tremor in the LEs).  - hooklying LTR with green theraball under legs, 1x20 each direction.  - hooklying hip adduction against green ball, 1x20 with 4 second holds assisted by metronome (no pain) - Quadruped bird dog (alternating shoulder flexion/contralateral hip extension with core muscles braced), cuing for TrA contraction, 3x5 each side with 5 second hold assisted by metronome to count time.  (Difficulty supporting with UE, seated rest between sets, mild discomfort at right groin).  - step up to 8.5 inch step with UE support as needed on wall in hallway, 2x10 each side. (Foot stays on step to decrease load on hip flexors, pt reports increasing pain with R step up so stopped ate 2 sets instead of planned 3).  - Lateral lunge onto 4 inch step with BUE support, 3x10 each side (right foot stays up on step due to pay with hip flexion to get to step, feels okay with slight stress at right adductors when moving foot left).  - standing hip abduction at hip machine, 3x10 each side, 55lbs, arm on position 5 (rates medium/hard difficulty)  Pt required multimodal cuing for proper technique and to facilitate improved neuromuscular control, strength, range of motion, and functional ability resulting in improved performance and form.  HOME EXERCISE PROGRAM Access Code: [email or web address] URL: [email or web address] Date: 12/06/2020 Prepared by: Rosita Kea   Exercises Bridge with Hip Abduction and Resistance - Ground Touches - 1 x daily - 1 sets - 20 reps - 5 seconds hold Isometric hip flexion - 1 x daily - 1 sets - 20 reps - 5 seconds hold Diagonal Hip Extension with Resistance - 1 x daily - 3 sets - 10 reps - 5 seconds hold     PT Education - 01/18/21 1116     Education Details Exercise purpose/form. Self management techniques    Person(s) Educated Patient    Methods Explanation;Demonstration;Tactile cues;Verbal cues    Comprehension Verbalized understanding;Returned demonstration;Verbal cues required;Tactile cues required;Need further instruction              PT Short Term Goals - 12/06/20 1355       PT SHORT TERM GOAL #1   Title Be independent with initial home exercise program for self-management of symptoms.    Baseline Initial HEP provided at visit 1 (12/04/2020);    Time 2    Period Weeks    Status Achieved    Target Date 12/19/20               PT Long  Term Goals - 12/05/20 1328       PT LONG TERM GOAL #1   Title Be independent with a long-term home exercise program for self-management of symptoms.    Baseline Initial HEP provided at visit 1 (12/04/2020);    Time 12    Period Weeks    Status New   TARGET DATE FOR ALL LONG TERM GOALS: 02/26/2021     PT LONG TERM GOAL #2   Title Demonstrate improved FOTO to equal or greater than 76 by visit #10 to demonstrate improvement in overall condition and self-reported functional ability.    Baseline 72 (12/04/2020);    Time 12    Period Weeks    Status New      PT LONG TERM GOAL #3   Title Improve B hip strength to equal or greater than 4+/5 with no increase in pain to improve patient's ability to complete functional tasks such as dressing, bed mobility, traveling with less difficulty.    Baseline R hip flexion 2/5 (12/04/2020);    Time 12    Period Weeks    Status New      PT LONG TERM GOAL #4   Title Reduce pain with functional activities to equal or less than 1/10 to allow patient to complete usual activities including dressing, walking, traveling with less difficulty.    Baseline worst 5/10 (12/04/2020);    Time 12    Period Weeks    Status New      PT LONG TERM GOAL #5   Title Complete community, work and/or recreational activities without limitation due to current condition.    Baseline Functional Limitations: sleeping, prolonged sitting, driving/traveling, sitting up, getting in/out of a car, bed mobility, turning over in bed, golfing, walking, dressing (12/04/2020);    Time 12    Period Weeks    Status New                   Plan - 01/18/21 1133     Clinical Impression Statement Patient returns after being away from PT for a week working in [place]. This work exacerbated his symptoms so he missed his last PT session. He continues to have pain/weakness with right hip flexion. Continued with tolerated  core and hip strengthening exercises today. Did not repeat dry needling  due to lack of evidence that it is helping. Patient would benefit from continued management of limiting condition by skilled physical therapist to address remaining impairments and functional limitations to work towards stated goals and return to PLOF or maximal functional independence.    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 [name]), diabetes with neuropathy, chronic renal insufficiency, stage 3,  cholecystectomy, CAD, GERD, HTN, hypothyroidism, PVD, B knee TKA, R hip THA (2009), L RTC repair (2019), back surgery    Examination-Activity Limitations Bed Mobility;Stairs;Sleep;Sit;Dressing;Locomotion Level;Bend    Examination-Participation Restrictions Community Activity;Driving;Interpersonal Relationship   sleeping, prolonged sitting, driving/traveling, sitting up, getting in/out of a car, bed mobility, turning over in bed, golfing, walking.   Stability/Clinical Decision Making Stable/Uncomplicated    Rehab Potential Good    PT Frequency 2x / week    PT Duration 12 weeks    PT Treatment/Interventions ADLs/Self Care Home Management;Cryotherapy;Moist Heat;Electrical Stimulation;DME Instruction;Gait training;Stair training;Functional mobility training;Therapeutic activities;Therapeutic exercise;Balance training;Neuromuscular re-education;Manual techniques;Dry needling;Joint Manipulations;Spinal Manipulations    PT Next Visit Plan update HEP as appropriate, strengthening as tolerated, manual as appropriate    PT Home Exercise Plan Medbridge Access Code: [email or web address]    [email or web address] and Agree with Plan of Care Patient             Patient will benefit from skilled therapeutic intervention in order to improve the following deficits and impairments:   Improper body mechanics, Pain, Decreased mobility, Increased muscle spasms, Decreased activity tolerance, Decreased endurance, Decreased range of motion, Decreased strength, Hypomobility, Impaired perceived functional ability, Difficulty walking  Visit Diagnosis: Pain in right hip  Chronic bilateral low back pain, unspecified whether sciatica present  Muscle weakness (generalized)     Problem List Patient Active Problem List   Diagnosis Date Noted   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 Graylon Good, PT, DPT 01/18/21, 11:48 AM  [facility] [address] [place], [address] Phone: [phone]   Fax:  [phone]  Name: Deantre Bourdon MRN: [id number] Date of Birth: May 22, 1940

## 2021-01-30 ENCOUNTER — Ambulatory Visit: Payer: Medicare Other | Admitting: Physical Therapy

## 2021-01-30 ENCOUNTER — Other Ambulatory Visit: Payer: Self-pay

## 2021-01-30 DIAGNOSIS — M6281 Muscle weakness (generalized): Secondary | ICD-10-CM

## 2021-01-30 DIAGNOSIS — G8929 Other chronic pain: Secondary | ICD-10-CM

## 2021-01-30 DIAGNOSIS — M25551 Pain in right hip: Secondary | ICD-10-CM | POA: Diagnosis not present

## 2021-01-30 NOTE — Therapy (Signed)
[facility] [address] [place], [address] Phone: [phone]   Fax:  [phone]  Physical Therapy Treatment / Discharge Summary Dates of reporting from 12/04/2020 to 01/30/2021  Patient Details  Name: [name] [name] MRN: [id number] Date of Birth: 1940/09/02 Referring Provider (PT): Lauris Poag, MD   Encounter Date: 01/30/2021   PT End of Session - 01/30/21 1134     Visit Number 10    Number of Visits 24    Date for PT Re-Evaluation 02/26/21    Authorization Type MEDICARE PART B reporting period from 12/04/2020    Progress Note Due on Visit 10    PT Start Time 1125    PT Stop Time 1205    PT Time Calculation (min) 40 min    Activity Tolerance Patient tolerated treatment well    Behavior During Therapy [facility] for tasks assessed/performed             Past Medical History:  Diagnosis Date   Anemia    Arthritis    CAD (coronary artery disease)    Chronic kidney disease    CKD stage 3   DDD (degenerative disc disease), thoracic    Diabetes mellitus without complication (HCC)    Eczema    GERD (gastroesophageal reflux disease)    Heart murmur    History of dysplastic nevus 12/09/2019   right calf/moderate   History of heart artery stent 2014   Per patient, [name] [name] put in 2 stents in 2014.   History of MRSA infection 02/2013   groin, after PTCA   Hyperlipidemia    Hypertension    Hypothyroidism    Macular degeneration    Myocardial infarction ([place]) 1999   Per Pt, MI in 1999.   PVD (peripheral vascular disease) [facility])     Past Surgical History:  Procedure Laterality Date   BACK SURGERY     BICEPT TENODESIS Left 05/13/2017   Procedure: BICEPS TENODESIS;  Surgeon: [name], MD;  Location: [facility] ORS;  Service: Orthopedics;  Laterality: Left;   CHOLECYSTECTOMY N/A 10/23/2017   Procedure: LAPAROSCOPIC CHOLECYSTECTOMY;  Surgeon: [name], MD;  Location: [facility] ORS;  Service:  General;  Laterality: N/A;   CORONARY ANGIOPLASTY     JOINT REPLACEMENT Bilateral    knee   JOINT REPLACEMENT Right    hip   SHOULDER ARTHROSCOPY WITH OPEN ROTATOR CUFF REPAIR Left 05/13/2017   Procedure: SHOULDER ARTHROSCOPY WITH OPEN ROTATOR CUFF REPAIR;  Surgeon: [name], MD;  Location: [facility] ORS;  Service: Orthopedics;  Laterality: Left;  subacromial decompression, debridement   TONSILLECTOMY     UMBILICAL HERNIA REPAIR  10/23/2017   Procedure: HERNIA REPAIR UMBILICAL ADULT;  Surgeon: [name], MD;  Location: [facility] ORS;  Service: General;;   VASECTOMY      There were no vitals filed for this visit.   Subjective Assessment - 01/30/21 1129     Subjective Patient reports he thinks he has benefitted from PT. He is having pain less during the day but his lateral hip pain/numbness still wakes him up at night.  He also still has difficulty actively flexing his right leg to get in and out of the car because it hurts when he does this. He also continues to have trouble with steps. He continues to go to exercise class 2x a week. He states the anterior thigh pain has improved and he doesn't get lateral hip pain/numbness when he does a lot  of standing. Reports the highest pain he has had in the last 2 weeks is 6/10. He state the second week in [place] when he went to [place] he "worked myself half to death, and that hurt me."    Pertinent History Patient is a 80 y.o. 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 This patient's chief complaints consist of intermittent right lateral hip and thigh pain and intermittent right groin pain leading to the following functional deficits: difficulty sleeping, prolonged sitting, driving/traveling, sitting up, getting in/out of a car, bed mobility, turning over in bed, golfing.   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 Nehemiah [name]), diabetes with neuropathy, chronic renal insufficiency, stage 3,  cholecystectomy, CAD, GERD, HTN, hypothyroidism, PVD, B knee TKA, R hip THA (2009), L RTC repair (2019), back surgery.  Patient denies hx of cancer, stroke, seizures, lung problem,  unexplained weight loss, changes in bowel or bladder problems, new onset stumbling or dropping things, neck surgery.    Limitations Sitting;Walking;House hold activities    How long can you walk comfortably? 20 min    Diagnostic tests X-ray Impression 11/22/2020: "Stable appearance to prior right total hip"    Patient Stated Goals "no pain"    Currently in Pain? No/denies    Pain Score 1     Pain Location Knee    Pain Orientation Left;Right    Effect of Pain on Daily Activities Functional Limitations: sleeping, prolonged sitting, driving/traveling, sitting up, getting in/out of a car, bed mobility, turning over in bed, golfing, walking. Pt reports these don't bother him as much as before he started PT.            OBJECTIVE  SELF-REPORTED FUNCTION FOTO score: 63/100 (hip questionnaire)  STRENGTH:   MUSCLE PERFORMANCE (MMT):  *Indicates pain 12/04/20 01/30/21  Joint/Motion R/L R/L  Hip      Flexion (L1, L2) 2*/4 4*/4  Extension (knee ext)  5/5  Abduction 4/4 4+/5  Adduction 4-*/4 5/5  External rotation (at 90) 3+/4 3/4  Internal rotation  / 5/5  Knee      Extension (L3) 5/5 /  Flexion (S2) 5/5 /  Ankle/Foot      Dorsiflexion (L4) 5/5 /  Great toe extension (L5) 5/5 /  Eversion (S1) 5/5 /  Plantarflexion (S1) 5/5 /  Comments:  12/04/20: R hip flexion and adduction produces pain in groin. Struggles to flex R hip due to pain.  01/30/21: states it is more painful to move R hip to flexion than it is to hold it there.   TREATMENT:  denies sensitivity to latex + R  THA, denies left THA + lumbar surgery for stenosis (denies fusion)   Therapeutic exercise: to centralize symptoms and improve ROM, strength, muscular endurance, and activity tolerance required for successful completion of functional activities.  - NuStep level 6 using bilateral lower extremities only. Seat setting 12. For improved extremity mobility, muscular endurance, and activity tolerance; and to induce the analgesic effect of aerobic exercise, stimulate improved joint nutrition, and prepare body structures and systems for following interventions. x 5 minutes. Average SPM = 102 - stand <> quadruped transfer mod I (used chair to help get up) - Quadruped bird dog (alternating shoulder flexion/contralateral hip extension with core muscles braced),  cuing for TrA contraction, 1x20 each side with 5 second hold. - Lateral lunge with BUE support, 1x10 each side. - step up to 8.5 inch step with UE support at TM bar 1x10 each side. (Reports increased pain at right hip on right side but feels he can do it).  - seated hip adduction against green ball, 1x22 with 5 second holds.  - Education on HEP including handout    Pt required multimodal cuing for proper technique and to facilitate improved neuromuscular control, strength, range of motion, and functional ability resulting in improved performance and form.       HOME EXERCISE PROGRAM Access Code: [email or web address] URL: [email or web address] Date: 01/30/2021 Prepared by: Rosita Kea  Exercises Bridge with Hip Abduction and Resistance - Ground Touches - 1 x daily - 1 sets - 20 reps - 5 seconds hold Seated Hip Adduction Isometrics with Ball - 1 x daily - 1 sets - 20 reps - 5 seconds hold Diagonal Hip Extension with Resistance - 3 x weekly - 3 sets - 10 reps - 5 seconds hold Bird Dog - 3 x weekly - 3 sets - 10 reps - 5 second hold Side Lunge with Counter Support - 3 x weekly - 3 sets - 10 reps Forward Step Up - 3 x weekly - 2 sets - 10 reps    PT Education - 01/30/21 1133     Education Details Exercise purpose/form. Self management techniques. dicharge reccomendations    Person(s) Educated Patient    Methods Explanation;Demonstration;Tactile cues;Verbal cues    Comprehension Verbalized understanding;Returned demonstration;Verbal cues required;Tactile cues required              PT Short Term Goals - 12/06/20 1355       PT SHORT TERM GOAL #1   Title Be independent with initial home exercise program for self-management of symptoms.    Baseline Initial HEP provided at visit 1 (12/04/2020);    Time 2    Period Weeks    Status Achieved    Target Date 12/19/20               PT Long Term Goals - 01/30/21 1156       PT LONG TERM GOAL #1   Title Be independent with a long-term home exercise program for self-management of symptoms.    Baseline Initial HEP provided at visit 1 (12/04/2020);    Time 12    Period Weeks    Status Achieved   TARGET DATE FOR ALL LONG TERM GOALS: 02/26/2021     PT LONG TERM GOAL #2   Title Demonstrate improved FOTO to equal or greater than 76 by visit #10 to demonstrate improvement in overall condition and self-reported functional ability.    Baseline 72/100 at visit 1(12/04/2020); 80/100 at visit 5 (12/21/2020); 63/100 at visit 10 (01/30/2021);    Time 12    Period Weeks    Status Not Met      PT LONG TERM GOAL #3   Title Improve B hip strength to equal or greater than 4+/5 with no increase in pain to improve patient's ability to complete functional tasks such as dressing, bed mobility, traveling with less difficulty.    Baseline R hip flexion 2/5 (12/04/2020); up to 4/5 (01/30/2021);    Time 12    Period Weeks    Status Partially Met      PT LONG TERM GOAL #4   Title Reduce pain with functional activities to equal or less than 1/10 to  allow patient to complete usual activities including dressing, walking, traveling with less difficulty.    Baseline worst 5/10 (12/04/2020); worst 6/10  (01/30/2021);    Time 12    Period Weeks    Status Not Met      PT LONG TERM GOAL #5   Title Complete community, work and/or recreational activities without limitation due to current condition.    Baseline Functional Limitations: sleeping, prolonged sitting, driving/traveling, sitting up, getting in/out of a car, bed mobility, turning over in bed, golfing, walking, dressing (12/04/2020); continues to have similar limitations but states they are less severe (01/30/2021);    Time 12    Period Weeks    Status Partially Met                   Plan - 01/30/21 1221     Clinical Impression Statement Patient attended 10 physical therapy sessions this episode of care. He participated well and attendance was good. Patient reported improvements in activity tolerance but continues to have significant limitations in the same activities he reported as limited at start of care and FOTO score (pt self report measure) is significantly decreased from 72 to 63 at last session. Patient demonstrates improved R hip strength but it continues to be painful and his pain with functional activity remains at least 6/10. Patient has become independent with HEP and was provided with final long term HEP for him to use with continued independent management of his condition. Patient and PT in agreement to discharge today to independent management and to follow up with referring clinician if patient would like to seek further relief of condition.    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 Nehemiah [name]), diabetes with neuropathy, chronic renal insufficiency, stage 3,  cholecystectomy, CAD, GERD, HTN, hypothyroidism, PVD, B knee TKA, R hip THA (2009), L RTC repair (2019), back  surgery    Examination-Activity Limitations Bed Mobility;Stairs;Sleep;Sit;Dressing;Locomotion Level;Bend    Examination-Participation Restrictions Community Activity;Driving;Interpersonal Relationship   sleeping, prolonged sitting, driving/traveling, sitting up, getting in/out of a car, bed mobility, turning over in bed, golfing, walking.   Stability/Clinical Decision Making Stable/Uncomplicated    Rehab Potential Good    PT Frequency 2x / week    PT Duration 12 weeks    PT Treatment/Interventions ADLs/Self Care Home Management;Cryotherapy;Moist Heat;Electrical Stimulation;DME Instruction;Gait training;Stair training;Functional mobility training;Therapeutic activities;Therapeutic exercise;Balance training;Neuromuscular re-education;Manual techniques;Dry needling;Joint Manipulations;Spinal Manipulations    PT Next Visit Plan patient is now discharged from Industry Access Code: [email or web address]    Consulted and Agree with Plan of Care Patient             Patient will benefit from skilled therapeutic intervention in order to improve the following deficits and impairments:  Improper body mechanics, Pain, Decreased mobility, Increased muscle spasms, Decreased activity tolerance, Decreased endurance, Decreased range of motion, Decreased strength, Hypomobility, Impaired perceived functional ability, Difficulty walking  Visit Diagnosis: Pain in right hip  Chronic bilateral low back pain, unspecified whether sciatica present  Muscle weakness (generalized)     Problem List Patient Active Problem List   Diagnosis Date Noted   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 Graylon Good, PT, DPT 01/30/21, 12:21 PM   [facility] [address] [place], [address] Phone: [phone]   Fax:  [phone]  Name: [name] [name] MRN: [id number] Date of Birth: April 03, 1940

## 2021-02-01 ENCOUNTER — Ambulatory Visit: Payer: Medicare Other | Admitting: Physical Therapy

## 2021-02-06 ENCOUNTER — Encounter: Payer: Medicare Other | Admitting: Physical Therapy

## 2021-02-08 ENCOUNTER — Encounter: Payer: Medicare Other | Admitting: Physical Therapy

## 2021-02-13 ENCOUNTER — Encounter: Payer: Medicare Other | Admitting: Physical Therapy

## 2021-02-15 ENCOUNTER — Encounter: Payer: Medicare Other | Admitting: Physical Therapy

## 2021-02-20 ENCOUNTER — Encounter: Payer: Medicare Other | Admitting: Physical Therapy

## 2021-02-22 ENCOUNTER — Encounter: Payer: Medicare Other | Admitting: Physical Therapy

## 2021-02-28 ENCOUNTER — Ambulatory Visit: Payer: Medicare Other | Admitting: Dermatology

## 2021-03-01 ENCOUNTER — Other Ambulatory Visit: Payer: Self-pay | Admitting: Orthopedic Surgery

## 2021-03-01 DIAGNOSIS — G8929 Other chronic pain: Secondary | ICD-10-CM

## 2021-03-01 DIAGNOSIS — M4807 Spinal stenosis, lumbosacral region: Secondary | ICD-10-CM

## 2021-03-10 ENCOUNTER — Ambulatory Visit
Admission: RE | Admit: 2021-03-10 | Discharge: 2021-03-10 | Disposition: A | Discharge status: Home / Self Care | Payer: Medicare Other | Source: Ambulatory Visit | Attending: Orthopedic Surgery | Admitting: Orthopedic Surgery

## 2021-03-10 DIAGNOSIS — M25551 Pain in right hip: Secondary | ICD-10-CM | POA: Diagnosis present

## 2021-03-10 DIAGNOSIS — M4807 Spinal stenosis, lumbosacral region: Secondary | ICD-10-CM | POA: Diagnosis present

## 2021-03-10 DIAGNOSIS — Z96641 Presence of right artificial hip joint: Secondary | ICD-10-CM | POA: Diagnosis present

## 2021-03-10 DIAGNOSIS — G8929 Other chronic pain: Secondary | ICD-10-CM | POA: Insufficient documentation

## 2021-04-03 ENCOUNTER — Other Ambulatory Visit: Payer: Self-pay

## 2021-04-03 ENCOUNTER — Ambulatory Visit (INDEPENDENT_AMBULATORY_CARE_PROVIDER_SITE_OTHER): Payer: Medicare Other | Admitting: Dermatology

## 2021-04-03 ENCOUNTER — Encounter: Payer: Self-pay | Admitting: Dermatology

## 2021-04-03 DIAGNOSIS — L820 Inflamed seborrheic keratosis: Secondary | ICD-10-CM | POA: Diagnosis not present

## 2021-04-03 DIAGNOSIS — L570 Actinic keratosis: Secondary | ICD-10-CM

## 2021-04-03 NOTE — Progress Notes (Signed)
° °  Follow-Up Visit   Subjective  [name] [name] is a 81 y.o. male who presents for the following: AK (3 month recheck. Mid scalp. Tx with LN2) and Seborrheic Keratosis (Recheck right cheek. No changes per patient).  The following portions of the chart were reviewed this encounter and updated as appropriate:  Tobacco   Allergies   Meds   Problems   Med Hx   Surg Hx   Fam Hx       Review of Systems: No other skin or systemic complaints except as noted in HPI or Assessment and Plan.   Objective  Well appearing patient in no apparent distress; mood and affect are within normal limits.  A focused examination was performed including scalp, face, legs. Relevant physical exam findings are noted in the Assessment and Plan.  Mid Scalp x2 (2) Erythematous thin macules with gritty scale.   Right Cheek 1.0 cm slightly erythematous patch with dark brown granular pigment with small focal waxy papule overlying and  without features suspicious for malignancy on dermoscopy , previous photo from 12/09/2019 showed brown patch or thin plaque       Assessment & Plan  AK (actinic keratosis) (2) Mid Scalp x2  Thin residual  Actinic keratoses are precancerous spots that appear secondary to cumulative UV radiation exposure/sun exposure over time. They are chronic with expected duration over 1 year. A portion of actinic keratoses will progress to squamous cell carcinoma of the skin. It is not possible to reliably predict which spots will progress to skin cancer and so treatment is recommended to prevent development of skin cancer.  Recommend daily broad spectrum sunscreen SPF 30+ to sun-exposed areas, reapply every 2 hours as needed.  Recommend staying in the shade or wearing long sleeves, sun glasses (UVA+UVB protection) and wide brim hats (4-inch brim around the entire circumference of the hat). Call for new or changing lesions.  Prior to procedure, discussed risks of blister formation, small wound, skin  dyspigmentation, or rare scar following cryotherapy. Recommend Vaseline ointment to treated areas while healing.   Destruction of lesion - Mid Scalp x2  Destruction method: cryotherapy   Informed consent: discussed and consent obtained   Lesion destroyed using liquid nitrogen: Yes   Outcome: patient tolerated procedure well with no complications   Post-procedure details: wound care instructions given    Inflamed seborrheic keratosis Right Cheek  Exam consistent with ISK with pigment incontinence  Previous photo c/w SK Will continue to monitor Call for changes   Return for TBSE As Scheduled.  I, Emelia Salisbury, CMA, am acting as scribe for Forest Gleason, MD.  Documentation: I have reviewed the above documentation for accuracy and completeness, and I agree with the above.  Forest Gleason, MD

## 2021-04-03 NOTE — Patient Instructions (Signed)
Cryotherapy Aftercare  Wash gently with soap and water everyday.   Apply Vaseline and Band-Aid daily until healed.   Prior to procedure, discussed risks of blister formation, small wound, skin dyspigmentation, or rare scar following cryotherapy. Recommend Vaseline ointment to treated areas while healing.   If You Need Anything After Your Visit  If you have any questions or concerns for your doctor, please call our main line at [phone] and press option 4 to reach your doctor's medical assistant. If no one answers, please leave a voicemail as directed and we will return your call as soon as possible. Messages left after 4 pm will be answered the following business day.   You may also send [place] a message via [place]. We typically respond to MyChart messages within 1-2 business days.  For prescription refills, please ask your pharmacy to contact our office. Our fax number is [phone].  If you have an urgent issue when the clinic is closed that cannot wait until the next business day, you can page your doctor at the number below.    Please note that while we do our best to be available for urgent issues outside of office hours, we are not available 24/7.   If you have an urgent issue and are unable to reach [place], you may choose to seek medical care at your doctor's office, retail clinic, urgent care center, or emergency room.  If you have a medical emergency, please immediately call 911 or go to the emergency department.  Pager Numbers  - Dr. [name]: [phone]  - Dr. Laurence Ferrari: [phone]  - Dr. Nicole Kindred: [phone]  In the event of inclement weather, please call our main line at [phone] for an update on the status of any delays or closures.  Dermatology Medication Tips: Please keep the boxes that topical medications come in in order to help keep track of the instructions about where and how to use these. Pharmacies typically print the medication instructions only on the  boxes and not directly on the medication tubes.   If your medication is too expensive, please contact our office at [phone] option 4 or send [place] a message through [place].   We are unable to tell what your co-pay for medications will be in advance as this is different depending on your insurance coverage. However, we may be able to find a substitute medication at lower cost or fill out paperwork to get insurance to cover a needed medication.   If a prior authorization is required to get your medication covered by your insurance company, please allow [place] 1-2 business days to complete this process.  Drug prices often vary depending on where the prescription is filled and some pharmacies may offer cheaper prices.  The website www.goodrx.com contains coupons for medications through different pharmacies. The prices here do not account for what the cost may be with help from insurance (it may be cheaper with your insurance), but the website can give you the price if you did not use any insurance.  - You can print the associated coupon and take it with your prescription to the pharmacy.  - You may also stop by our office during regular business hours and pick up a GoodRx coupon card.  - If you need your prescription sent electronically to a different pharmacy, notify our office through [facility] or by phone at [phone] option 4.     Si Usted Necesita Algo Despus de Su Visita  Tambin puede enviarnos un mensaje a [place] Cruel  de MyChart. Por lo general respondemos a los mensajes de MyChart en el transcurso de 1 a 2 das hbiles.  Para renovar recetas, por favor pida a su farmacia que se ponga en contacto con nuestra oficina. Harland Dingwall de fax es [place] [phone].  Si tiene un asunto urgente cuando la clnica est cerrada y que no puede esperar hasta el siguiente da hbil, puede llamar/localizar a su doctor(a) al nmero que aparece a continuacin.   Por favor, tenga en cuenta que  aunque hacemos todo lo posible para estar disponibles para asuntos urgentes fuera del horario de [place], no estamos disponibles las 24 horas del da, los 7 das de la [place].   Si tiene un problema urgente y no puede comunicarse con nosotros, puede optar por buscar atencin mdica  en el consultorio de su doctor(a), en una clnica privada, en un centro de atencin urgente o en una sala de emergencias.  Si tiene Engineering geologist, por favor llame inmediatamente al 911 o vaya a la sala de emergencias.  Nmeros de bper  - Dr. [name]: [phone]  - Dra. Moye: [phone]  - Dra. Nicole Kindred: [phone]  En caso de inclemencias del [place], por favor llame a Johnsie Kindred principal al [phone] para una actualizacin sobre el [place] de cualquier retraso o cierre.  Consejos para la medicacin en dermatologa: Por favor, guarde las cajas en las que vienen los medicamentos de uso tpico para ayudarle a seguir las instrucciones sobre dnde y cmo usarlos. Las farmacias generalmente imprimen las instrucciones del medicamento slo en las cajas y no directamente en los tubos del [place].   Si su medicamento es muy caro, por favor, pngase en contacto con Zigmund Daniel llamando al [phone] y presione la opcin 4 o envenos un mensaje a travs de Pharmacist, community.   No podemos decirle cul ser su copago por los medicamentos por adelantado ya que esto es diferente dependiendo de la cobertura de su seguro. Sin embargo, es posible que podamos encontrar un medicamento sustituto a Electrical engineer un formulario para que el seguro cubra el medicamento que se considera necesario.   Si se requiere una autorizacin previa para que su compaa de seguros [place] su medicamento, por favor permtanos de 1 a 2 das hbiles para completar este proceso.  Los precios de los medicamentos varan con frecuencia dependiendo del Environmental consultant de dnde se surte la receta y alguna farmacias pueden ofrecer precios ms  baratos.  El sitio web www.goodrx.com tiene cupones para medicamentos de Airline pilot. Los precios aqu no tienen en cuenta lo que podra costar con la ayuda del seguro (puede ser ms barato con su seguro), pero el sitio web puede darle el precio si no utiliz Research scientist (physical sciences).  - Puede imprimir el cupn correspondiente y llevarlo con su receta a la farmacia.  - Tambin puede pasar por nuestra oficina durante el horario de atencin regular y Charity fundraiser una tarjeta de cupones de GoodRx.  - Si necesita que su receta se enve electrnicamente a una farmacia diferente, informe a nuestra oficina a travs de MyChart de [place] o por telfono llamando al [phone] y presione la opcin 4.

## 2021-04-11 ENCOUNTER — Encounter: Payer: Self-pay | Admitting: Dermatology

## 2021-07-10 ENCOUNTER — Other Ambulatory Visit: Payer: Self-pay | Admitting: Internal Medicine

## 2021-07-10 DIAGNOSIS — I712 Thoracic aortic aneurysm, without rupture, unspecified: Secondary | ICD-10-CM

## 2021-07-11 ENCOUNTER — Other Ambulatory Visit: Payer: Self-pay

## 2021-07-11 ENCOUNTER — Ambulatory Visit
Admission: RE | Admit: 2021-07-11 | Discharge: 2021-07-11 | Disposition: A | Discharge status: Home / Self Care | Payer: Medicare Other | Source: Ambulatory Visit | Attending: Internal Medicine | Admitting: Internal Medicine

## 2021-07-11 DIAGNOSIS — I712 Thoracic aortic aneurysm, without rupture, unspecified: Secondary | ICD-10-CM | POA: Insufficient documentation

## 2021-07-11 MED ORDER — IOHEXOL 350 MG/ML SOLN
75.0000 mL | Freq: Once | INTRAVENOUS | Status: AC | PRN
Start: 2021-07-11 — End: 2021-07-11
  Administered 2021-07-11: 75 mL via INTRAVENOUS

## 2021-11-02 ENCOUNTER — Other Ambulatory Visit (INDEPENDENT_AMBULATORY_CARE_PROVIDER_SITE_OTHER): Payer: Self-pay | Admitting: Vascular Surgery

## 2021-11-02 DIAGNOSIS — I723 Aneurysm of iliac artery: Secondary | ICD-10-CM

## 2021-11-02 DIAGNOSIS — I77811 Abdominal aortic ectasia: Secondary | ICD-10-CM

## 2021-11-06 ENCOUNTER — Encounter (INDEPENDENT_AMBULATORY_CARE_PROVIDER_SITE_OTHER): Payer: Self-pay | Admitting: Vascular Surgery

## 2021-11-06 ENCOUNTER — Ambulatory Visit (INDEPENDENT_AMBULATORY_CARE_PROVIDER_SITE_OTHER): Payer: Medicare Other

## 2021-11-06 ENCOUNTER — Ambulatory Visit (INDEPENDENT_AMBULATORY_CARE_PROVIDER_SITE_OTHER): Payer: Medicare Other | Admitting: Vascular Surgery

## 2021-11-06 VITALS — BP 145/81 | HR 66 | Resp 17 | Ht 72.0 in | Wt 249.0 lb

## 2021-11-06 DIAGNOSIS — I10 Essential (primary) hypertension: Secondary | ICD-10-CM

## 2021-11-06 DIAGNOSIS — I723 Aneurysm of iliac artery: Secondary | ICD-10-CM

## 2021-11-06 DIAGNOSIS — I7121 Aneurysm of the ascending aorta, without rupture: Secondary | ICD-10-CM

## 2021-11-06 DIAGNOSIS — I77811 Abdominal aortic ectasia: Secondary | ICD-10-CM | POA: Diagnosis not present

## 2021-11-06 DIAGNOSIS — E785 Hyperlipidemia, unspecified: Secondary | ICD-10-CM

## 2021-11-06 NOTE — Assessment & Plan Note (Signed)
blood pressure control important in reducing the progression of atherosclerotic disease and aneurysmal growth. On appropriate oral medications.  

## 2021-11-06 NOTE — Assessment & Plan Note (Signed)
He had a CT scan performed couple months ago of the chest which shows a stable 4.4 cm ascending thoracic aortic aneurysm.  Aortoiliac duplex today shows a maximal diameter of the aorta at 2.9 cm with the iliac arteries being 1.2 to 1.3 cm.  Borderline aneurysmal degeneration of the abdominal aorta and the common iliac arteries versus ectasia.  This can be checked every 2 years.  Blood pressure control important in reducing growth.

## 2021-11-06 NOTE — Assessment & Plan Note (Signed)
lipid control important in reducing the progression of atherosclerotic disease. Continue statin therapy  

## 2021-11-06 NOTE — Assessment & Plan Note (Signed)
CT scan earlier this year demonstrated stable 4.4 cm thoracic aortic aneurysm.  This can be checked every 2 years at this point.  Blood pressure control important for reducing aneurysm growth.

## 2021-11-06 NOTE — Progress Notes (Signed)
MRN : [id number]  [name] [name] is a 81 y.o. (September 12, 1940) male who presents with chief complaint of No chief complaint on file. 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  He is doing well and has no complaints today.  He denies any aneurysm related symptoms. Specifically, the patient denies new back or abdominal pain, or signs of peripheral embolization.  He had a CT scan performed couple months ago of the chest which shows a stable 4.4 cm ascending thoracic aortic aneurysm.  Aortoiliac duplex today shows a maximal diameter of the aorta at 2.9 cm with the iliac arteries being 1.2 to 1.3 cm.  Current Outpatient Medications  Medication Sig Dispense Refill   aspirin EC 81 MG tablet Take 40.5 mg by mouth daily. 1/2 tablet (40.'5mg'$ ) by mouth daily     Cyanocobalamin (B-12) 2500 MCG TABS Take 2,500 mcg by mouth daily.     gabapentin (NEURONTIN) 300 MG capsule Take 300 mg by mouth at bedtime.      levocetirizine (XYZAL) 5 MG tablet Take 5 mg by mouth every evening.     levothyroxine (SYNTHROID, LEVOTHROID) 50 MCG tablet Take 50 mcg by mouth daily before breakfast.     lisinopril (ZESTRIL) 5 MG tablet Take 1 tablet by mouth daily.     metFORMIN (GLUCOPHAGE) 500 MG tablet Take 500 mg by mouth 2 (two) times daily.      Multiple Vitamins-Minerals (PRESERVISION AREDS 2) CAPS Take 1 capsule by mouth 2 (two) times daily.     Multiple Vitamins-Minerals (PRESERVISION AREDS) CAPS Take 1 capsule by mouth daily.     mupirocin ointment (BACTROBAN) 2 % Apply 1 application topically daily. 22 g 1   nitroGLYCERIN (NITROSTAT) 0.4 MG SL tablet Place 1 tablet under the tongue as needed. For chest pain     omeprazole (PRILOSEC) 40 MG capsule Take 40 mg by mouth daily.      ondansetron (ZOFRAN-ODT) 4 MG disintegrating tablet Take by mouth.     rosuvastatin (CRESTOR) 20 MG tablet Take 1 tablet by mouth daily.      sildenafil (REVATIO) 20 MG tablet Take 40-60 mg by mouth daily as needed (erectile dysfunction).     SOLIQUA 100-33 UNT-MCG/ML SOPN SMARTSIG:40 Unit(s) SUB-Q Daily     tacrolimus (PROTOPIC) 0.1 % ointment APPLY TOPICALLY 2 (TWO) TIMES DAILY. TO AFFECTED AREAS ON LEGS 100 g 3   terbinafine (LAMISIL) 250 MG tablet Take 1 tablet (250 mg total) by mouth daily. Discontinue Crestor while taking this medication. 30 tablet 1   traMADol (ULTRAM) 50 MG tablet Take by mouth.     lisinopril (PRINIVIL,ZESTRIL) 5 MG tablet Take 5 mg by mouth at bedtime.      No current facility-administered medications for this visit.    Past Medical History:  Diagnosis Date   Anemia    Arthritis    CAD (coronary artery disease)    Chronic kidney disease    CKD stage 3   DDD (degenerative disc disease), thoracic    Diabetes mellitus without complication (HCC)    Eczema    GERD (gastroesophageal reflux disease)    Heart murmur    History of dysplastic nevus 12/09/2019   right calf/moderate   History of heart artery stent 2014   Per patient, [name] put in 2 stents in 2014.   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   PVD (peripheral vascular disease) [facility])     Past Surgical History:  Procedure Laterality Date   BACK SURGERY     BICEPT TENODESIS Left 05/13/2017   Procedure: BICEPS TENODESIS;  Surgeon: [name], MD;  Location: [facility] ORS;  Service: Orthopedics;  Laterality: Left;   CHOLECYSTECTOMY N/A 10/23/2017   Procedure: LAPAROSCOPIC CHOLECYSTECTOMY;  Surgeon: [name], MD;  Location: [facility] ORS;  Service: General;  Laterality: N/A;   CORONARY ANGIOPLASTY     JOINT REPLACEMENT Bilateral    knee   JOINT REPLACEMENT Right    hip   SHOULDER ARTHROSCOPY WITH OPEN ROTATOR CUFF REPAIR Left 05/13/2017   Procedure: SHOULDER ARTHROSCOPY WITH OPEN ROTATOR  CUFF REPAIR;  Surgeon: [name], MD;  Location: [facility] ORS;  Service: Orthopedics;  Laterality: Left;  subacromial decompression, debridement   TONSILLECTOMY     UMBILICAL HERNIA REPAIR  10/23/2017   Procedure: HERNIA REPAIR UMBILICAL ADULT;  Surgeon: [name], MD;  Location: [facility] ORS;  Service: General;;   VASECTOMY       Social History   Tobacco Use   Smoking status: Former    Types: Cigarettes    Quit date: 01/04/1998    Years since quitting: 23.8   Smokeless tobacco: Never  Vaping Use   Vaping Use: Never used  Substance Use Topics   Alcohol use: Yes    Alcohol/week: 12.0 - 14.0 standard drinks of alcohol    Types: 6 - 7 Cans of beer, 6 - 7 Standard drinks or equivalent per week   Drug use: No      Family History  Problem Relation Age of Onset   Stroke Father    Diabetes Paternal Grandmother     Allergies  Allergen Reactions   Sulfamethoxazole Hives   Tomato     Breaks out   Fish Oil Rash   Sulfa Antibiotics Hives    REVIEW OF SYSTEMS (Negative unless checked)   Constitutional: '[]'$ Weight loss  '[]'$ Fever  '[]'$ Chills Cardiac: '[]'$ Chest pain   '[]'$ Chest pressure   '[]'$ Palpitations   '[]'$ Shortness of breath when laying flat   '[]'$ Shortness of breath at rest   '[]'$ Shortness of breath with exertion. Vascular:  '[]'$ Pain in legs with walking   '[]'$ Pain in legs at rest   '[]'$ Pain in legs when laying flat   '[]'$ Claudication   '[]'$ Pain in feet when walking  '[]'$ Pain in feet at rest  '[]'$ Pain in feet when laying flat   '[]'$ History of DVT   '[]'$ Phlebitis   '[]'$ Swelling in legs   '[]'$ Varicose veins   '[]'$ Non-healing ulcers Pulmonary:   '[]'$ Uses home oxygen   '[]'$ Productive cough   '[]'$ Hemoptysis   '[]'$ Wheeze  '[]'$ COPD   '[]'$ Asthma Neurologic:  '[]'$ Dizziness  '[]'$ Blackouts   '[]'$ Seizures   '[]'$ History of stroke   '[]'$ History of TIA  '[]'$ Aphasia   '[]'$ Temporary blindness   '[]'$ Dysphagia   '[]'$ Weakness or numbness in arms   '[]'$ Weakness or numbness in legs Musculoskeletal:  '[x]'$ Arthritis   '[]'$ Joint swelling   '[]'$ Joint pain   '[]'$ Low back  pain Hematologic:  '[]'$ Easy bruising  '[]'$ Easy bleeding   '[]'$ Hypercoagulable state   '[x]'$ Anemic   Gastrointestinal:  '[]'$ Blood in stool   '[]'$ Vomiting blood  '[x]'$ Gastroesophageal reflux/heartburn   '[x]'$ Abdominal pain Genitourinary:  '[]'$ Chronic kidney disease   '[]'$ Difficult urination  '[]'$ Frequent urination  '[]'$ Burning with urination   '[]'$ Hematuria Skin:  '[]'$ Rashes   '[]'$ Ulcers   '[]'$ Wounds  Psychological:  '[]'$ History of anxiety   '[]'$  History of major depression.   Physical Examination  BP (!) 145/81 (BP Location: Left Arm)   Pulse 66   Resp 17   Ht 6' (1.829 m)   Wt 249 lb (112.9 kg)   BMI 33.77 kg/m  Gen:  WD/WN, NAD. Appears younger than stated age. Head: /AT, No temporalis wasting. Ear/Nose/Throat: Hearing grossly intact, nares w/o erythema or drainage Eyes: Conjunctiva clear. Sclera non-icteric Neck: Supple.  Trachea midline Pulmonary:  Good air movement, no use of accessory muscles.  Cardiac: RRR, no JVD Vascular:  Vessel Right Left  Radial Palpable Palpable                                   Gastrointestinal: soft, non-tender/non-distended. No guarding/reflex.  Musculoskeletal: M/S 5/5 throughout.  No deformity or atrophy. No edema. Neurologic: Sensation grossly intact in extremities.  Symmetrical.  Speech is fluent.  Psychiatric: Judgment intact, Mood & affect appropriate for pt's clinical situation. Dermatologic: No rashes or ulcers noted.  No cellulitis or open wounds.      Labs No results found for this or any previous visit (from the past 2160 hour(s)).  Radiology No results found.  Assessment/Plan  Aneurysm of thoracic aorta (HCC) CT scan earlier this year demonstrated stable 4.4 cm thoracic aortic aneurysm.  This can be checked every 2 years at this point.  Blood pressure control important for reducing aneurysm growth.  Hypertension blood pressure control important in reducing the progression of atherosclerotic disease and aneurysmal growth. On appropriate oral  medications.   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  Aortoiliac duplex today shows a maximal diameter of the aorta at 2.9 cm with the iliac arteries being 1.2 to 1.3 cm.  Borderline aneurysmal degeneration of the abdominal aorta and the common iliac arteries versus ectasia.  This can be checked every 2 years.  Blood pressure control important in reducing growth.  Hyperlipidemia lipid control important in reducing the progression of atherosclerotic disease. Continue statin therapy    Leotis Pain, MD  11/06/2021 11:55 AM    This note was created with Dragon medical transcription system.  Any errors from dictation are purely unintentional

## 2021-12-31 ENCOUNTER — Encounter (INDEPENDENT_AMBULATORY_CARE_PROVIDER_SITE_OTHER): Payer: Self-pay

## 2022-01-16 ENCOUNTER — Encounter: Payer: Medicare Other | Admitting: Dermatology

## 2022-04-25 IMAGING — MR MR LUMBAR SPINE W/O CM
5 series · 31 of 48 positions shown · non-contrast
Comparison: 01/20/2013

CLINICAL DATA: Low back pain radiating to the right buttock.

Spinal stenosis of lumbosacral region R8V.NG (RYE-9Q-CM)Chronic hip
pain after total replacement of right hip joint M25.551, G89.29,
20F.FH5 (RYE-9Q-CM)
EXAM:
MRI LUMBAR SPINE WITHOUT CONTRAST
TECHNIQUE: Multiplanar, multisequence MR imaging of the lumbar spine was
performed. No intravenous contrast was administered.

[Series 5: T2 · sagittal · 4.0mm · 0.81mm/px · 6 of 19 slices shown (1 of 2)]
[im 1/19]
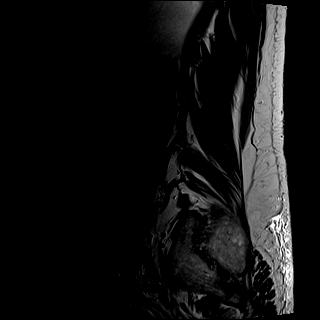
[im 4/19]
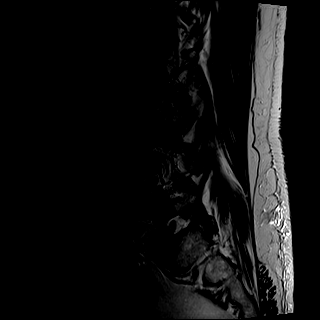
[im 8/19]
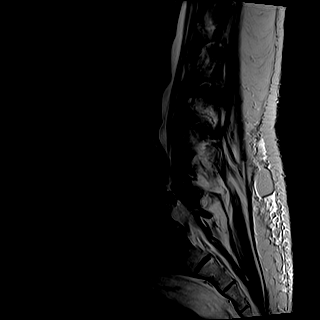
[im 11/19]
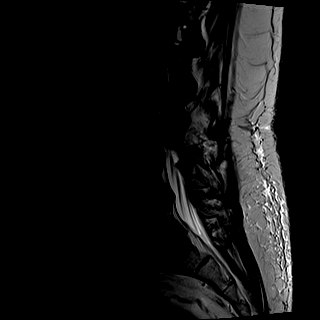
[im 15/19]
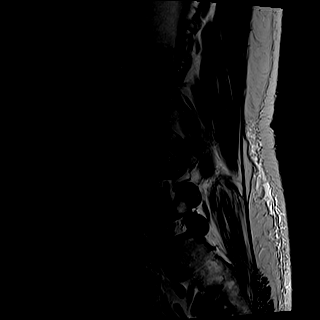
[im 19/19]
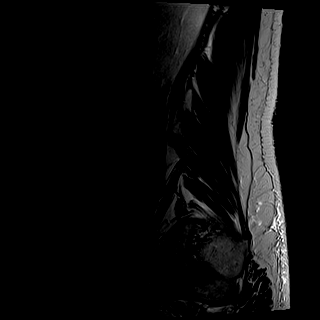

[Series 6: T1 · sagittal · 4.0mm · 0.81mm/px · 7 of 19 slices shown (1 of 2)]
[im 1/19]
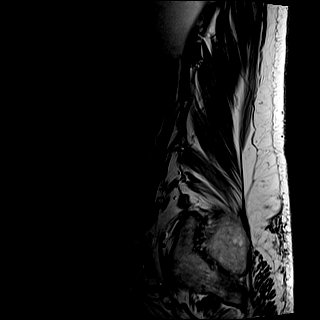
[im 4/19]
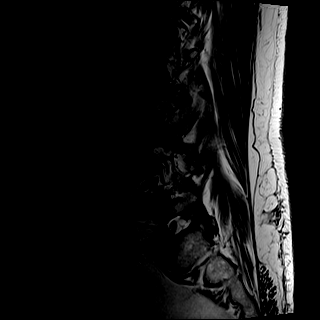
[im 7/19]
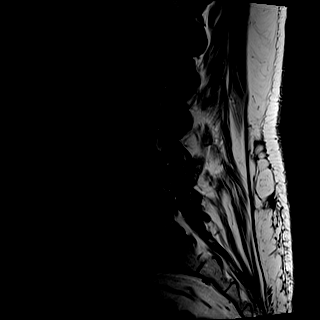
[im 10/19]
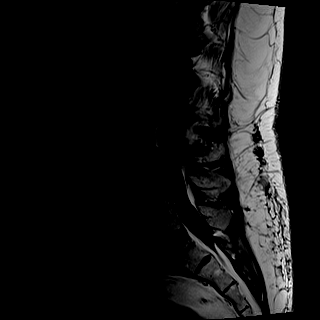
[im 13/19]
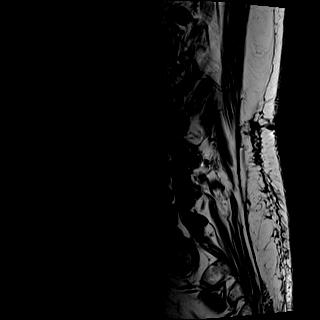
[im 16/19]
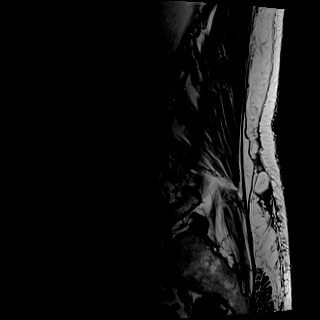
[im 19/19]
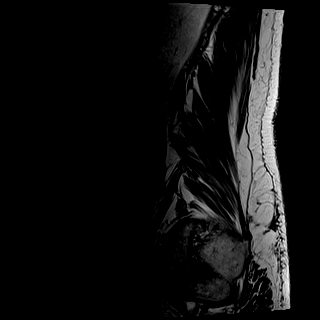

[Series 7: STIR · sagittal · 4.0mm · 0.41mm/px · 2 of 19 slices shown]
[im 1/19]
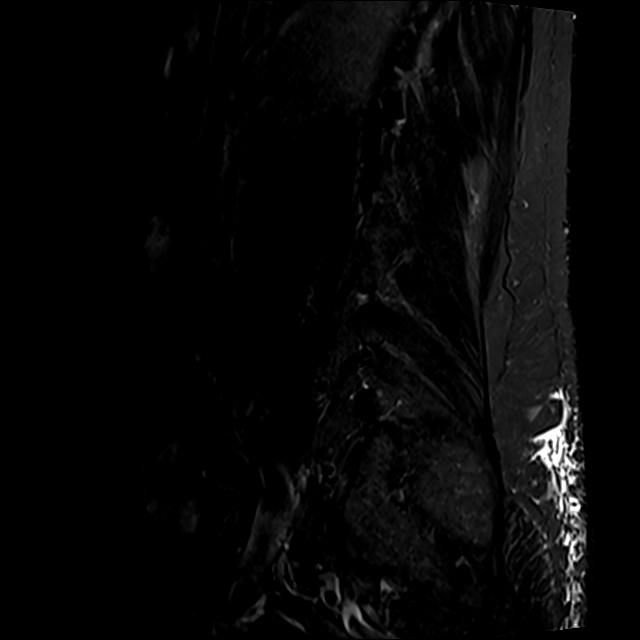
[im 4/19]
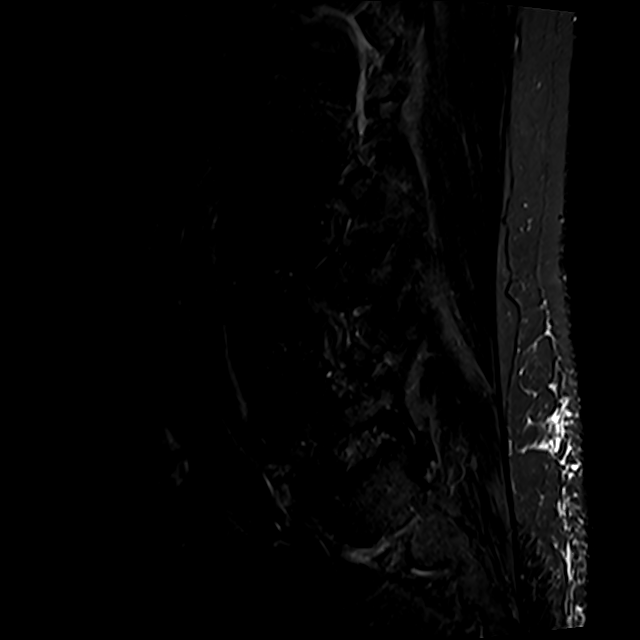

[Series 10: T2 · axial · 4.0mm · 0.78mm/px · z∈[-83,+155]mm · 8 of 41 slices shown (2 of 2)]
[im 1/41]
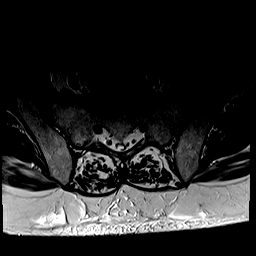
[im 7/41]
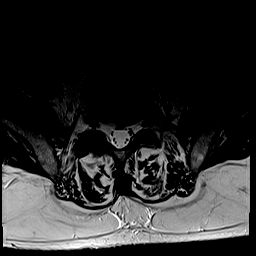
[im 13/41]
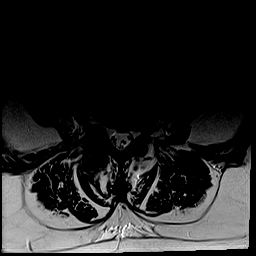
[im 19/41]
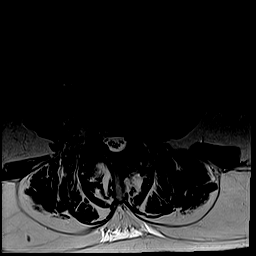
[im 22/41]
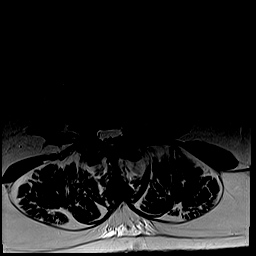
[im 28/41]
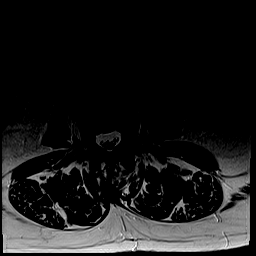
[im 34/41]
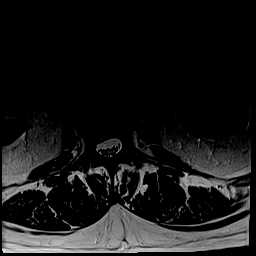
[im 41/41]
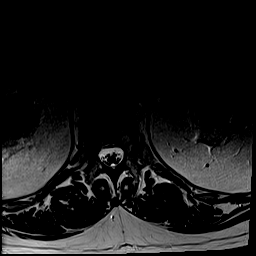

[Series 13: T1 · axial · 4.0mm · 0.39mm/px · z∈[-83,+155]mm · 8 of 41 slices shown (2 of 2)]
[im 1/41]
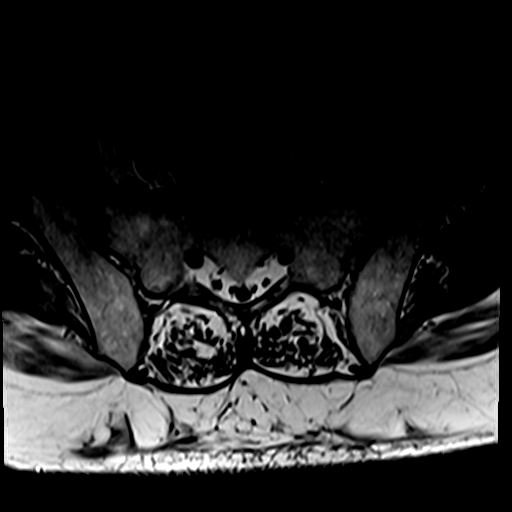
[im 7/41]
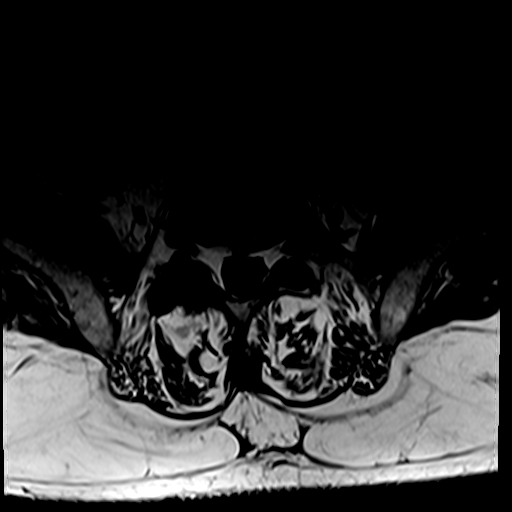
[im 13/41]
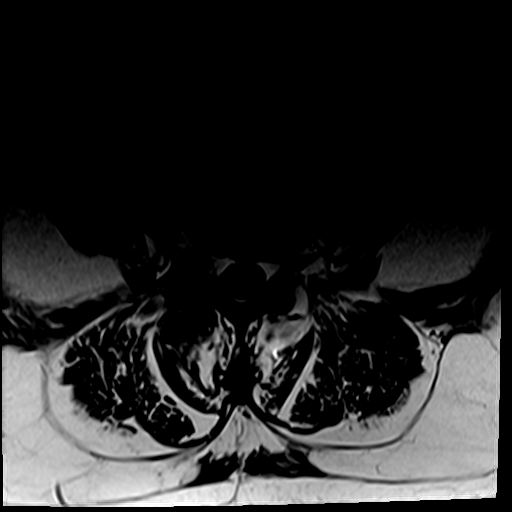
[im 19/41]
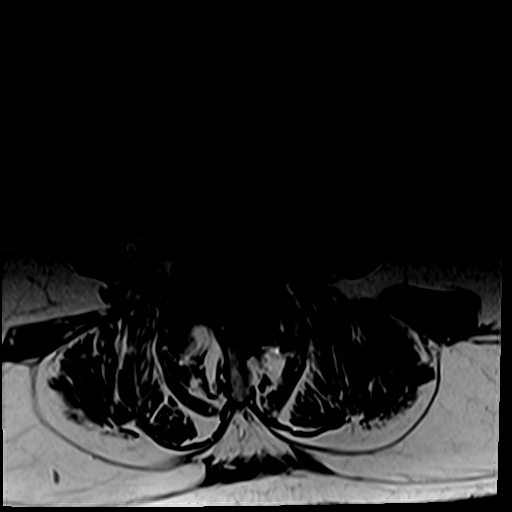
[im 22/41]
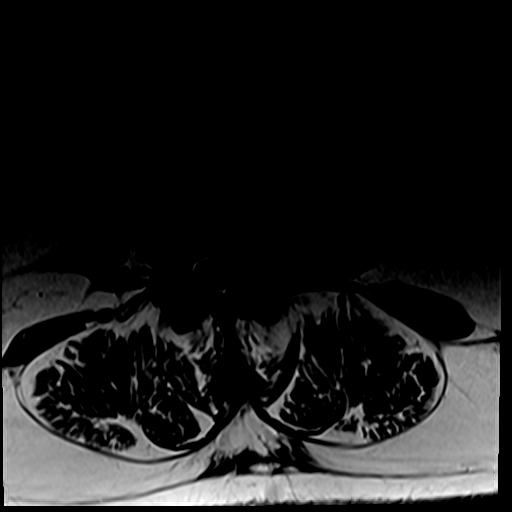
[im 28/41]
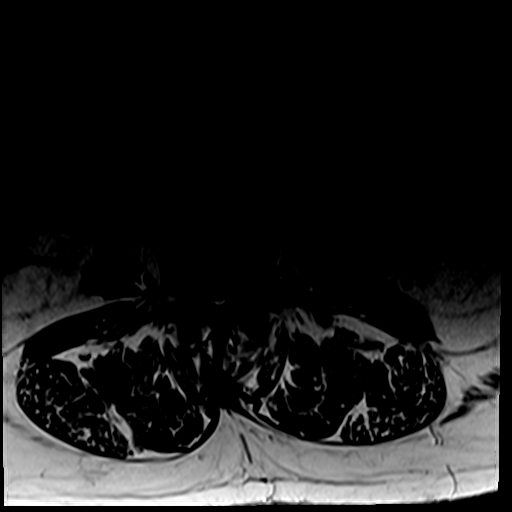
[im 34/41]
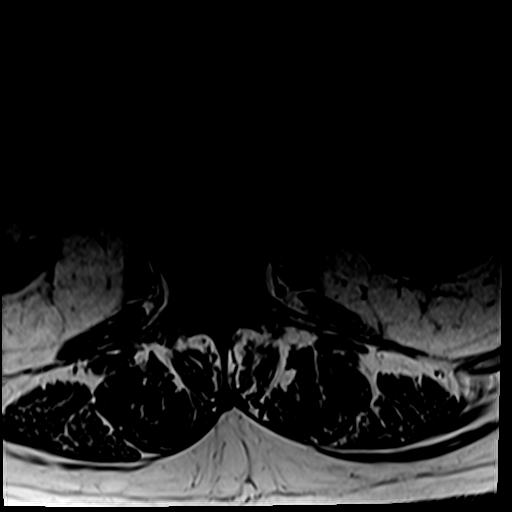
[im 41/41]
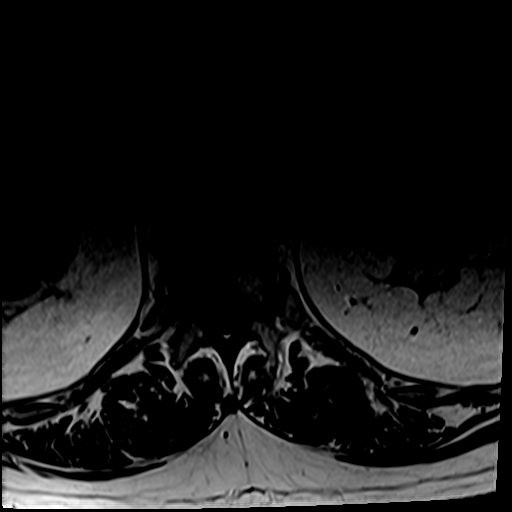

[31 of 48 positions shown; findings below may reference images not displayed]

FINDINGS: Segmentation:  Standard.

Alignment: Grade 1 retrolisthesis at L2-3 and grade 1
anterolisthesis at L3-4

Vertebrae:  No fracture, evidence of discitis, or bone lesion.

Conus medullaris and cauda equina: Conus extends to the L1 level.
Conus and cauda equina appear normal.

Paraspinal and other soft tissues: Negative.

Disc levels:

T12-L1: Normal.

L1-L2: Decreased size of small right subarticular disc extrusion.
Right lateral recess narrowing without central spinal canal
stenosis. No neural foraminal stenosis.

L2-L3: Slightly worsened disc bulge. Left lateral recess narrowing
without central spinal canal stenosis. No neural foraminal stenosis.

L3-L4: Severe facet hypertrophy. Decreased size of disc bulge after
surgery. No spinal canal stenosis. Unchanged moderate bilateral
neural foraminal stenosis.

L4-L5: Small disc bulge and mild facet hypertrophy. No spinal canal
stenosis. Mild right neural foraminal stenosis.

L5-S1: Normal disc. Mild facet hypertrophy. No spinal canal
stenosis. No neural foraminal stenosis.

Visualized sacrum: Normal.
IMPRESSION: 1. Status post L3-4 discectomy with unchanged moderate bilateral
neural foraminal stenosis and severe facet arthrosis.
2. Decreased size of small right subarticular disc extrusion at
L1-L2 with right lateral recess narrowing.
3. Slight worsening of L2-3 disc bulge with left lateral recess
narrowing.
4. Mild right L4-5 neural foraminal stenosis.

## 2022-05-23 ENCOUNTER — Encounter: Payer: Self-pay | Admitting: Dermatology

## 2022-05-23 ENCOUNTER — Ambulatory Visit (INDEPENDENT_AMBULATORY_CARE_PROVIDER_SITE_OTHER): Payer: Medicare Other | Admitting: Dermatology

## 2022-05-23 VITALS — BP 107/63 | HR 76

## 2022-05-23 DIAGNOSIS — L821 Other seborrheic keratosis: Secondary | ICD-10-CM

## 2022-05-23 DIAGNOSIS — L814 Other melanin hyperpigmentation: Secondary | ICD-10-CM | POA: Diagnosis not present

## 2022-05-23 DIAGNOSIS — Z1283 Encounter for screening for malignant neoplasm of skin: Secondary | ICD-10-CM | POA: Diagnosis not present

## 2022-05-23 DIAGNOSIS — D492 Neoplasm of unspecified behavior of bone, soft tissue, and skin: Secondary | ICD-10-CM

## 2022-05-23 DIAGNOSIS — L430 Hypertrophic lichen planus: Secondary | ICD-10-CM

## 2022-05-23 DIAGNOSIS — D229 Melanocytic nevi, unspecified: Secondary | ICD-10-CM

## 2022-05-23 DIAGNOSIS — I872 Venous insufficiency (chronic) (peripheral): Secondary | ICD-10-CM

## 2022-05-23 DIAGNOSIS — L578 Other skin changes due to chronic exposure to nonionizing radiation: Secondary | ICD-10-CM | POA: Diagnosis not present

## 2022-05-23 DIAGNOSIS — L570 Actinic keratosis: Secondary | ICD-10-CM

## 2022-05-23 MED ORDER — CLOBETASOL PROPIONATE 0.05 % EX OINT: TOPICAL_OINTMENT | CUTANEOUS | 1 refills | Status: AC

## 2022-05-23 NOTE — Patient Instructions (Addendum)
Cryotherapy Aftercare  Wash gently with soap and water everyday.   Apply Vaseline and Band-Aid daily until healed.    Start clobetasol ointment twice daily x 2 weeks then weekends only. Avoid applying to face, groin, and axilla. Use as directed. Long-term use can cause thinning of the skin.  Can try NeoStrata Skin Active Triple Firming Neck Cream  Topical steroids (such as triamcinolone, fluocinolone, fluocinonide, mometasone, clobetasol, halobetasol, betamethasone, hydrocortisone) can cause thinning and lightening of the skin if they are used for too long in the same area. Your physician has selected the right strength medicine for your problem and area affected on the body. Please use your medication only as directed by your physician to prevent side effects.   Wound Care Instructions  Cleanse wound gently with soap and water once a day then pat dry with clean gauze. Apply a thin coat of Petrolatum (petroleum jelly, "Vaseline") over the wound (unless you have an allergy to this). We recommend that you use a new, sterile tube of Vaseline. Do not pick or remove scabs. Do not remove the yellow or white "healing tissue" from the base of the wound.  Cover the wound with fresh, clean, nonstick gauze and secure with paper tape. You may use Band-Aids in place of gauze and tape if the wound is small enough, but would recommend trimming much of the tape off as there is often too much. Sometimes Band-Aids can irritate the skin.  You should call the office for your biopsy report after 1 week if you have not already been contacted.  If you experience any problems, such as abnormal amounts of bleeding, swelling, significant bruising, significant pain, or evidence of infection, please call the office immediately.  FOR ADULT SURGERY PATIENTS: If you need something for pain relief you may take 1 extra strength Tylenol (acetaminophen) AND 2 Ibuprofen (200mg  each) together every 4 hours as needed for pain. (do  not take these if you are allergic to them or if you have a reason you should not take them.) Typically, you may only need pain medication for 1 to 3 days.   Recommend taking Heliocare sun protection supplement daily in sunny weather for additional sun protection. For maximum protection on the sunniest days, you can take up to 2 capsules of regular Heliocare OR take 1 capsule of Heliocare Ultra. For prolonged exposure (such as a full day in the sun), you can repeat your dose of the supplement 4 hours after your first dose. Heliocare can be purchased at [employer], at some [facility] or at [email or web address].    Melanoma ABCDEs  Melanoma is the most dangerous type of skin cancer, and is the leading cause of death from skin disease.  You are more likely to develop melanoma if you: Have light-colored skin, light-colored eyes, or red or blond hair Spend a lot of time in the sun Tan regularly, either outdoors or in a tanning bed Have had blistering sunburns, especially during childhood Have a close family member who has had a melanoma Have atypical moles or large birthmarks  Early detection of melanoma is key since treatment is typically straightforward and cure rates are extremely high if we catch it early.   The first sign of melanoma is often a change in a mole or a new dark spot.  The ABCDE system is a way of remembering the signs of melanoma.  A for asymmetry:  The two halves do not match. B for border:  The edges of the growth  are irregular. C for color:  A mixture of colors are present instead of an even brown color. D for diameter:  Melanomas are usually (but not always) greater than 77mm - the size of a pencil eraser. E for evolution:  The spot keeps changing in size, shape, and color.  Please check your skin once per month between visits. You can use a small mirror in front and a large mirror behind you to keep an eye on the back side or your body.   If you see any new or  changing lesions before your next follow-up, please call to schedule a visit.  Please continue daily skin protection including broad spectrum sunscreen SPF 30+ to sun-exposed areas, reapplying every 2 hours as needed when you're outdoors.    Due to recent changes in healthcare laws, you may see results of your pathology and/or laboratory studies on MyChart before the doctors have had a chance to review them. We understand that in some cases there may be results that are confusing or concerning to you. Please understand that not all results are received at the same time and often the doctors may need to interpret multiple results in order to provide you with the best plan of care or course of treatment. Therefore, we ask that you please give [place] 2 business days to thoroughly review all your results before contacting the office for clarification. Should we see a critical lab result, you will be contacted sooner.   If You Need Anything After Your Visit  If you have any questions or concerns for your doctor, please call our main line at [phone] and press option 4 to reach your doctor's medical assistant. If no one answers, please leave a voicemail as directed and we will return your call as soon as possible. Messages left after 4 pm will be answered the following business day.   You may also send [place] a message via [place]. We typically respond to MyChart messages within 1-2 business days.  For prescription refills, please ask your pharmacy to contact our office. Our fax number is [phone].  If you have an urgent issue when the clinic is closed that cannot wait until the next business day, you can page your doctor at the number below.    Please note that while we do our best to be available for urgent issues outside of office hours, we are not available 24/7.   If you have an urgent issue and are unable to reach [place], you may choose to seek medical care at your doctor's office, retail clinic,  urgent care center, or emergency room.  If you have a medical emergency, please immediately call 911 or go to the emergency department.  Pager Numbers  - Dr. [name]: [phone]  - Dr. Laurence Ferrari: [phone]  - Dr. Nicole Kindred: [phone]  In the event of inclement weather, please call our main line at [phone] for an update on the status of any delays or closures.  Dermatology Medication Tips: Please keep the boxes that topical medications come in in order to help keep track of the instructions about where and how to use these. Pharmacies typically print the medication instructions only on the boxes and not directly on the medication tubes.   If your medication is too expensive, please contact our office at [phone] option 4 or send [place] a message through [place].   We are unable to tell what your co-pay for medications will be in advance as this is different depending on your  insurance coverage. However, we may be able to find a substitute medication at lower cost or fill out paperwork to get insurance to cover a needed medication.   If a prior authorization is required to get your medication covered by your insurance company, please allow [place] 1-2 business days to complete this process.  Drug prices often vary depending on where the prescription is filled and some pharmacies may offer cheaper prices.  The website www.goodrx.com contains coupons for medications through different pharmacies. The prices here do not account for what the cost may be with help from insurance (it may be cheaper with your insurance), but the website can give you the price if you did not use any insurance.  - You can print the associated coupon and take it with your prescription to the pharmacy.  - You may also stop by our office during regular business hours and pick up a GoodRx coupon card.  - If you need your prescription sent electronically to a different pharmacy, notify our office through [facility] or by phone at [phone] option 4.     Si Usted Necesita Algo Despus de Su Visita  Tambin puede enviarnos un mensaje a travs de Pharmacist, community. Por lo general respondemos a los mensajes de MyChart en el transcurso de 1 a 2 das hbiles.  Para renovar recetas, por favor pida a su farmacia que se ponga en contacto con nuestra oficina. Harland Dingwall de fax es [place] [phone].  Si tiene un asunto urgente cuando la clnica est cerrada y que no puede esperar hasta el siguiente da hbil, puede llamar/localizar a su doctor(a) al nmero que aparece a continuacin.   Por favor, tenga en cuenta que aunque hacemos todo lo posible para estar disponibles para asuntos urgentes fuera del horario de [place], no estamos disponibles las 24 horas del da, los 7 das de la [place].   Si tiene un problema urgente y no puede comunicarse con nosotros, puede optar por buscar atencin mdica  en el consultorio de su doctor(a), en una clnica privada, en un centro de atencin urgente o en una sala de emergencias.  Si tiene Engineering geologist, por favor llame inmediatamente al 911 o vaya a la sala de emergencias.  Nmeros de bper  - Dr. [name]: [phone]  - Dra. Moye: [phone]  - Dra. Nicole Kindred: [phone]  En caso de inclemencias del [place], por favor llame a Johnsie Kindred principal al [phone] para una actualizacin sobre el [place] de cualquier retraso o cierre.  Consejos para la medicacin en dermatologa: Por favor, guarde las cajas en las que vienen los medicamentos de uso tpico para ayudarle a seguir las instrucciones sobre dnde y cmo usarlos. Las farmacias generalmente imprimen las instrucciones del medicamento slo en las cajas y no directamente en los tubos del [place].   Si su medicamento es muy caro, por favor, pngase en contacto con Zigmund Daniel llamando al [phone] y presione la opcin 4 o envenos un mensaje a travs de Pharmacist, community.   No podemos decirle cul  ser su copago por los medicamentos por adelantado ya que esto es diferente dependiendo de la cobertura de su seguro. Sin embargo, es posible que podamos encontrar un medicamento sustituto a Electrical engineer un formulario para que el seguro cubra el medicamento que se considera necesario.   Si se requiere una autorizacin previa para que su compaa de seguros [place] su medicamento, por favor permtanos de 1 a 2 das hbiles para completar este proceso.  Los  precios de los medicamentos varan con frecuencia dependiendo del lugar de dnde se surte la receta y alguna farmacias pueden ofrecer precios ms baratos.  El sitio web www.goodrx.com tiene cupones para medicamentos de Airline pilot. Los precios aqu no tienen en cuenta lo que podra costar con la ayuda del seguro (puede ser ms barato con su seguro), pero el sitio web puede darle el precio si no utiliz Research scientist (physical sciences).  - Puede imprimir el cupn correspondiente y llevarlo con su receta a la farmacia.  - Tambin puede pasar por nuestra oficina durante el horario de atencin regular y Charity fundraiser una tarjeta de cupones de GoodRx.  - Si necesita que su receta se enve electrnicamente a una farmacia diferente, informe a nuestra oficina a travs de MyChart de [place] o por telfono llamando al [phone] y presione la opcin 4.

## 2022-05-23 NOTE — Progress Notes (Signed)
Follow-Up Visit   Subjective  [name] [name] is a 82 y.o. male who presents for the following: Skin Cancer Screening and Full Body Skin Exam  The patient presents for Total-Body Skin Exam (TBSE) for skin cancer screening and mole check. The patient has spots, moles and lesions to be evaluated, some may be new or changing and the patient has concerns that these could be cancer.   The following portions of the chart were reviewed this encounter and updated as appropriate: medications, allergies, medical history  Review of Systems:  No other skin or systemic complaints except as noted in HPI or Assessment and Plan.  Objective  Well appearing patient in no apparent distress; mood and affect are within normal limits.  A full examination was performed including scalp, head, eyes, ears, nose, lips, neck, chest, axillae, abdomen, back, buttocks, bilateral upper extremities, bilateral lower extremities, hands, feet, fingers, toes, fingernails, and toenails. All findings within normal limits unless otherwise noted below.   Relevant physical exam findings are noted in the Assessment and Plan.  Right Frontal Scalp 1.4 cm thin brown plaque  R/o ISK with pigment incontinence > Atypia         Assessment & Plan   Neoplasm of skin Right Frontal Scalp  Skin / nail biopsy Type of biopsy: tangential   Informed consent: discussed and consent obtained   Timeout: patient name, date of birth, surgical site, and procedure verified   Procedure prep:  Patient was prepped and draped in usual sterile fashion Prep type:  Isopropyl alcohol Anesthesia: the lesion was anesthetized in a standard fashion   Anesthetic:  1% lidocaine w/ epinephrine 1-100,000 buffered w/ 8.4% NaHCO3 Instrument used: flexible razor blade   Hemostasis achieved with: aluminum chloride   Outcome: patient tolerated procedure well   Post-procedure details: wound care instructions given   Additional details:  Petrolatum and a  pressure bandage applied  Specimen 1 - Surgical pathology Differential Diagnosis: R/o ISK with pigment incontinence > Atypia  Check Margins: No 1.4 cm thin brown plaque     LENTIGINES, SEBORRHEIC KERATOSES, HEMANGIOMAS - Benign normal skin lesions - Benign-appearing - Call for any changes  MELANOCYTIC NEVI - Tan-brown and/or pink-flesh-colored symmetric macules and papules - Benign appearing on exam today - Observation - Call clinic for new or changing moles - Recommend daily use of broad spectrum spf 30+ sunscreen to sun-exposed areas.   ACTINIC DAMAGE - Chronic condition, secondary to cumulative UV/sun exposure - diffuse scaly erythematous macules with underlying dyspigmentation - Recommend daily broad spectrum sunscreen SPF 30+ to sun-exposed areas, reapply every 2 hours as needed.  - Staying in the shade or wearing long sleeves, sun glasses (UVA+UVB protection) and wide brim hats (4-inch brim around the entire circumference of the hat) are also recommended for sun protection.  - Call for new or changing lesions.  SKIN CANCER SCREENING PERFORMED TODAY  ACTINIC KERATOSIS  Actinic keratoses are precancerous spots that appear secondary to cumulative UV radiation exposure/sun exposure over time. They are chronic with expected duration over 1 year. A portion of actinic keratoses will progress to squamous cell carcinoma of the skin. It is not possible to reliably predict which spots will progress to skin cancer and so treatment is recommended to prevent development of skin cancer.  Recommend daily broad spectrum sunscreen SPF 30+ to sun-exposed areas, reapply every 2 hours as needed.  Recommend staying in the shade or wearing long sleeves, sun glasses (UVA+UVB protection) and wide brim hats (4-inch brim around  the entire circumference of the hat). Call for new or changing lesions.   Prior to procedure, discussed risks of blister formation, small wound, skin dyspigmentation, or rare  scar following cryotherapy. Recommend Vaseline ointment to treated areas while healing.  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 Condition is symptomatic/ bothersome to patient. Not currently at goal.  Treatment Plan: Start clobetasol ointment twice daily x 2 weeks then weekends only. Avoid applying to face, groin, and axilla. Use as directed. Long-term use can cause thinning of the skin.  Topical steroids (such as triamcinolone, fluocinolone, fluocinonide, mometasone, clobetasol, halobetasol, betamethasone, hydrocortisone) can cause thinning and lightening of the skin if they are used for too long in the same area. Your physician has selected the right strength medicine for your problem and area affected on the body. Please use your medication only as directed by your physician to prevent side effects.   Stasis in the legs causes chronic leg swelling, which may result in itchy or painful rashes, skin discoloration, skin texture changes, and sometimes ulceration.  Will confirm with vascular if ok to increase compression at legs. Elevate legs as much as possible. Avoid salt/sodium rich foods.    Return for 2-3 months stasis, AK follow up, 1 year TBSE.  Graciella Belton, RMA, am acting as scribe for Forest Gleason, MD .   Documentation: I have reviewed the above documentation for accuracy and completeness, and I  agree with the above.  Forest Gleason, MD

## 2022-05-28 ENCOUNTER — Telehealth: Payer: Self-pay

## 2022-05-28 NOTE — Telephone Encounter (Signed)
-----   Message from Alfonso Patten, MD sent at 05/28/2022  9:47 AM EDT ----- Skin , right frontal scalp LICHEN PLANUS-LIKE KERATOSIS, PIGMENTED --> inflamed sun spot, no treatment needed  MAs please call. Thank you!

## 2022-06-20 ENCOUNTER — Telehealth: Payer: Self-pay

## 2022-06-20 NOTE — Telephone Encounter (Signed)
-----   Message from Virginia Moye, MD sent at 05/28/2022  9:47 AM EDT ----- Skin , right frontal scalp LICHEN PLANUS-LIKE KERATOSIS, PIGMENTED --> inflamed sun spot, no treatment needed  MAs please call. Thank you! 

## 2022-06-27 ENCOUNTER — Telehealth: Payer: Self-pay

## 2022-06-27 NOTE — Telephone Encounter (Signed)
Discussed biopsy results with patient 

## 2022-06-27 NOTE — Telephone Encounter (Signed)
-----   Message from Virginia Moye, MD sent at 05/28/2022  9:47 AM EDT ----- Skin , right frontal scalp LICHEN PLANUS-LIKE KERATOSIS, PIGMENTED --> inflamed sun spot, no treatment needed  MAs please call. Thank you! 

## 2022-07-01 ENCOUNTER — Other Ambulatory Visit: Payer: Self-pay | Admitting: Nephrology

## 2022-07-01 DIAGNOSIS — N184 Chronic kidney disease, stage 4 (severe): Secondary | ICD-10-CM

## 2022-07-01 DIAGNOSIS — E1122 Type 2 diabetes mellitus with diabetic chronic kidney disease: Secondary | ICD-10-CM

## 2022-07-08 ENCOUNTER — Ambulatory Visit
Admission: RE | Admit: 2022-07-08 | Discharge: 2022-07-08 | Disposition: A | Discharge status: Home / Self Care | Payer: Medicare Other | Source: Ambulatory Visit | Attending: Nephrology | Admitting: Nephrology

## 2022-07-08 DIAGNOSIS — N184 Chronic kidney disease, stage 4 (severe): Secondary | ICD-10-CM | POA: Diagnosis present

## 2022-07-08 DIAGNOSIS — E1122 Type 2 diabetes mellitus with diabetic chronic kidney disease: Secondary | ICD-10-CM | POA: Diagnosis not present

## 2022-08-15 ENCOUNTER — Telehealth: Payer: Self-pay

## 2022-08-15 NOTE — Telephone Encounter (Addendum)
Called patient at provider's request to discuss ok to start wearing compression stockings. No answer. LMOM for patient to return call.     ----- Message from Sandi Mealy, MD sent at 08/15/2022 12:11 PM EDT ----- Regarding: FW: compression Please advise patient vascular surgeon was okay with compression. So I would recommend prescription compression stockings 20-30 mmhg. Thank you!  ----- Message ----- From: Annice Needy, MD Sent: 08/15/2022  10:54 AM EDT To: Sandi Mealy, MD Subject: RE: compression                                Compression is perfectly fine by me. ----- Message ----- From: Sandi Mealy, MD Sent: 08/15/2022   9:54 AM EDT To: Annice Needy, MD Subject: compression                                    Hi Dr. Wyn Quaker,  Mr. Pullin is having some flaring of his stasis dermatitis, and I would love to add compression, but he said he has been seeing you so I wanted to check with you first. I could not appreciate pedal pulses on his exam. I did order an ABI back in 2021 which showed  FINDINGS: Right ABI:  0.83   Left ABI:  0.91   Right Lower Extremity:  Biphasic arterial waveforms at the ankle.   Left Lower Extremity:  Biphasic arterial waveforms at the ankle.   0.8-0.89 Mild PAD   IMPRESSION: 1. Mildly abnormal resting right ankle-brachial index of 0.83 consistent with at least mild underlying peripheral arterial disease. 2. Minimally abnormal resting left ankle-brachial index of 0.91 consistent with borderline peripheral arterial disease.  Are you comfortable saying okay to compression for him, or do you think he needs further evaluation/repeat ABI before adding compression?  Thank you! Dorathy Daft, MD, MPH Dermatologist [facility]  [phone]

## 2022-08-28 ENCOUNTER — Ambulatory Visit: Payer: Medicare Other | Admitting: Dermatology

## 2022-10-10 ENCOUNTER — Ambulatory Visit (INDEPENDENT_AMBULATORY_CARE_PROVIDER_SITE_OTHER): Payer: Medicare Other | Admitting: Dermatology

## 2022-10-10 DIAGNOSIS — L578 Other skin changes due to chronic exposure to nonionizing radiation: Secondary | ICD-10-CM

## 2022-10-10 DIAGNOSIS — L814 Other melanin hyperpigmentation: Secondary | ICD-10-CM

## 2022-10-10 DIAGNOSIS — D485 Neoplasm of uncertain behavior of skin: Secondary | ICD-10-CM | POA: Diagnosis not present

## 2022-10-10 DIAGNOSIS — Z1283 Encounter for screening for malignant neoplasm of skin: Secondary | ICD-10-CM

## 2022-10-10 DIAGNOSIS — D229 Melanocytic nevi, unspecified: Secondary | ICD-10-CM

## 2022-10-10 DIAGNOSIS — C4442 Squamous cell carcinoma of skin of scalp and neck: Secondary | ICD-10-CM

## 2022-10-10 DIAGNOSIS — W908XXA Exposure to other nonionizing radiation, initial encounter: Secondary | ICD-10-CM | POA: Diagnosis not present

## 2022-10-10 DIAGNOSIS — L821 Other seborrheic keratosis: Secondary | ICD-10-CM

## 2022-10-10 DIAGNOSIS — D1801 Hemangioma of skin and subcutaneous tissue: Secondary | ICD-10-CM

## 2022-10-10 DIAGNOSIS — L820 Inflamed seborrheic keratosis: Secondary | ICD-10-CM

## 2022-10-10 DIAGNOSIS — C4492 Squamous cell carcinoma of skin, unspecified: Secondary | ICD-10-CM

## 2022-10-10 DIAGNOSIS — L570 Actinic keratosis: Secondary | ICD-10-CM

## 2022-10-10 HISTORY — DX: Squamous cell carcinoma of skin, unspecified: C44.92

## 2022-10-10 NOTE — Patient Instructions (Addendum)
Electrodesiccation and Curettage ("Scrape and Burn") Wound Care Instructions  Leave the original bandage on for 24 hours if possible.  If the bandage becomes soaked or soiled before that time, it is OK to remove it and examine the wound.  A small amount of post-operative bleeding is normal.  If excessive bleeding occurs, remove the bandage, place gauze over the site and apply continuous pressure (no peeking) over the area for 30 minutes. If this does not work, please call our clinic as soon as possible or page your doctor if it is after hours.   Once a day, cleanse the wound with soap and water. It is fine to shower. If a thick crust develops you may use a Q-tip dipped into dilute hydrogen peroxide (mix 1:1 with water) to dissolve it.  Hydrogen peroxide can slow the healing process, so use it only as needed.    After washing, apply petroleum jelly (Vaseline) or an antibiotic ointment if your doctor prescribed one for you, followed by a bandage.    For best healing, the wound should be covered with a layer of ointment at all times. If you are not able to keep the area covered with a bandage to hold the ointment in place, this may mean re-applying the ointment several times a day.  Continue this wound care until the wound has healed and is no longer open. It may take several weeks for the wound to heal and close.  Itching and mild discomfort is normal during the healing process.  If you have any discomfort, you can take Tylenol (acetaminophen) or ibuprofen as directed on the bottle. (Please do not take these if you have an allergy to them or cannot take them for another reason).  Some redness, tenderness and white or yellow material in the wound is normal healing.  If the area becomes very sore and red, or develops a thick yellow-green material (pus), it may be infected; please notify us.    Wound healing continues for up to one year following surgery. It is not unusual to experience pain in the scar  from time to time during the interval.  If the pain becomes severe or the scar thickens, you should notify the office.    A slight amount of redness in a scar is expected for the first six months.  After six months, the redness will fade and the scar will soften and fade.  The color difference becomes less noticeable with time.  If there are any problems, return for a post-op surgery check at your earliest convenience.  To improve the appearance of the scar, you can use silicone scar gel, cream, or sheets (such as Mederma or Serica) every night for up to one year. These are available over the counter (without a prescription).  Please call our office at [phone] for any questions or concerns.  Due to recent changes in healthcare laws, you may see results of your pathology and/or laboratory studies on MyChart before the doctors have had a chance to review them. We understand that in some cases there may be results that are confusing or concerning to you. Please understand that not all results are received at the same time and often the doctors may need to interpret multiple results in order to provide you with the best plan of care or course of treatment. Therefore, we ask that you please give [place] 2 business days to thoroughly review all your results before contacting the office for clarification. Should we see a  critical lab result, you will be contacted sooner.   If You Need Anything After Your Visit  If you have any questions or concerns for your doctor, please call our main line at [phone] and press option 4 to reach your doctor's medical assistant. If no one answers, please leave a voicemail as directed and we will return your call as soon as possible. Messages left after 4 pm will be answered the following business day.   You may also send [place] a message via MyChart. We typically respond to MyChart messages within 1-2 business days.  For prescription refills, please ask your pharmacy to  contact our office. Our fax number is [phone].  If you have an urgent issue when the clinic is closed that cannot wait until the next business day, you can page your doctor at the number below.    Please note that while we do our best to be available for urgent issues outside of office hours, we are not available 24/7.   If you have an urgent issue and are unable to reach [place], you may choose to seek medical care at your doctor's office, retail clinic, urgent care center, or emergency room.  If you have a medical emergency, please immediately call 911 or go to the emergency department.  Pager Numbers  - Dr. Gwen Pounds: [phone]  - Dr. Roseanne Reno: [phone]  In the event of inclement weather, please call our main line at [phone] for an update on the status of any delays or closures.  Dermatology Medication Tips: Please keep the boxes that topical medications come in in order to help keep track of the instructions about where and how to use these. Pharmacies typically print the medication instructions only on the boxes and not directly on the medication tubes.   If your medication is too expensive, please contact our office at [phone] option 4 or send [place] a message through MyChart.   We are unable to tell what your co-pay for medications will be in advance as this is different depending on your insurance coverage. However, we may be able to find a substitute medication at lower cost or fill out paperwork to get insurance to cover a needed medication.   If a prior authorization is required to get your medication covered by your insurance company, please allow [place] 1-2 business days to complete this process.  Drug prices often vary depending on where the prescription is filled and some pharmacies may offer cheaper prices.  The website www.goodrx.com contains coupons for medications through different pharmacies. The prices here do not account for what the cost may be with help  from insurance (it may be cheaper with your insurance), but the website can give you the price if you did not use any insurance.  - You can print the associated coupon and take it with your prescription to the pharmacy.  - You may also stop by our office during regular business hours and pick up a GoodRx coupon card.  - If you need your prescription sent electronically to a different pharmacy, notify our office through [facility] or by phone at [phone] option 4.     Si Usted Necesita Algo Despus de Su Visita  Tambin puede enviarnos un mensaje a travs de Clinical cytogeneticist. Por lo general respondemos a los mensajes de MyChart en el transcurso de 1 a 2 das hbiles.  Para renovar recetas, por favor pida a su farmacia que se ponga en contacto con nuestra oficina. Annie Sable de fax  es el [phone].  Si tiene un asunto urgente cuando la clnica est cerrada y que no puede esperar hasta el siguiente da hbil, puede llamar/localizar a su doctor(a) al nmero que aparece a continuacin.   Por favor, tenga en cuenta que aunque hacemos todo lo posible para estar disponibles para asuntos urgentes fuera del horario de [place], no estamos disponibles las 24 horas del da, los 7 [address]  [address] de la [place].   Si tiene un problema urgente y no puede comunicarse con nosotros, puede optar por buscar atencin mdica  en el consultorio de su doctor(a), en una clnica privada, en un centro de atencin urgente o en una sala de emergencias.  Si tiene Engineer, drilling, por favor llame inmediatamente al 911 o vaya a la sala de emergencias.  Nmeros de bper  - Dr. Gwen Pounds: [phone]  - Dra. Roseanne Reno: [phone]  En caso de inclemencias del [place], por favor llame a Lacy Duverney principal al [phone] para una actualizacin sobre el [place] de cualquier retraso o cierre.  Consejos para la medicacin en dermatologa: Por favor, guarde las cajas en las que vienen los medicamentos de uso tpico  para ayudarle a seguir las instrucciones sobre dnde y cmo usarlos. Las farmacias generalmente imprimen las instrucciones del medicamento slo en las cajas y no directamente en los tubos del [place].   Si su medicamento es muy caro, por favor, pngase en contacto con Rolm Gala llamando al [phone] y presione la opcin 4 o envenos un mensaje a travs de Clinical cytogeneticist.   No podemos decirle cul ser su copago por los medicamentos por adelantado ya que esto es diferente dependiendo de la cobertura de su seguro. Sin embargo, es posible que podamos encontrar un medicamento sustituto a Audiological scientist un formulario para que el seguro cubra el medicamento que se considera necesario.   Si se requiere una autorizacin previa para que su compaa de seguros [place] su medicamento, por favor permtanos de 1 a 2 das hbiles para completar [address]  Los precios de los medicamentos varan con frecuencia dependiendo del Environmental consultant de dnde se surte la receta y alguna farmacias pueden ofrecer precios ms baratos.  El sitio web www.goodrx.com tiene cupones para medicamentos de Health and safety inspector. Los precios aqu no tienen en cuenta lo que podra costar con la ayuda del seguro (puede ser ms barato con su seguro), pero el sitio web puede darle el precio si no utiliz Tourist information centre manager.  - Puede imprimir el cupn correspondiente y llevarlo con su receta a la farmacia.  - Tambin puede pasar por nuestra oficina durante el horario de atencin regular y Education officer, museum una tarjeta de cupones de GoodRx.  - Si necesita que su receta se enve electrnicamente a una farmacia diferente, informe a nuestra oficina a travs de MyChart de [place] o por telfono llamando al [phone] y presione la opcin 4.

## 2022-10-10 NOTE — Progress Notes (Signed)
Follow-Up Visit   Subjective  [name] [name] is a 82 y.o. male who presents for the following: Skin Cancer Screening and Full Body Skin Exam The patient presents for Total-Body Skin Exam (TBSE) for skin cancer screening and mole check. The patient has spots, moles and lesions to be evaluated, some may be new or changing and the patient may have concern these could be cancer.  The following portions of the chart were reviewed this encounter and updated as appropriate: medications, allergies, medical history  Review of Systems:  No other skin or systemic complaints except as noted in HPI or Assessment and Plan.  Objective  Well appearing patient in no apparent distress; mood and affect are within normal limits.  A full examination was performed including scalp, head, eyes, ears, nose, lips, neck, chest, axillae, abdomen, back, buttocks, bilateral upper extremities, bilateral lower extremities, hands, feet, fingers, toes, fingernails, and toenails. All findings within normal limits unless otherwise noted below.   Relevant physical exam findings are noted in the Assessment and Plan.  R frontal scalp x 1 Erythematous stuck-on, waxy papule or plaque  mid vertex scalp 2.1 cm Hyperkeratotic papule.      Scalp x 1, nose x 1 (2) Erythematous thin papules/macules with gritty scale.    Assessment & Plan   SKIN CANCER SCREENING PERFORMED TODAY.  ACTINIC DAMAGE - Chronic condition, secondary to cumulative UV/sun exposure - diffuse scaly erythematous macules with underlying dyspigmentation - Recommend daily broad spectrum sunscreen SPF 30+ to sun-exposed areas, reapply every 2 hours as needed.  - Staying in the shade or wearing long sleeves, sun glasses (UVA+UVB protection) and wide brim hats (4-inch brim around the entire circumference of the hat) are also recommended for sun protection.  - Call for new or changing lesions.  LENTIGINES, SEBORRHEIC KERATOSES, HEMANGIOMAS - Benign normal  skin lesions - Benign-appearing - Call for any changes  MELANOCYTIC NEVI - Tan-brown and/or pink-flesh-colored symmetric macules and papules - Benign appearing on exam today - Observation - Call clinic for new or changing moles - Recommend daily use of broad spectrum spf 30+ sunscreen to sun-exposed areas.   Inflamed seborrheic keratosis R frontal scalp x 1  Bx proven - Symptomatic, irritating, patient would like treated.   Destruction of lesion - R frontal scalp x 1 Complexity: simple   Destruction method: cryotherapy   Informed consent: discussed and consent obtained   Timeout:  patient name, date of birth, surgical site, and procedure verified Lesion destroyed using liquid nitrogen: Yes   Region frozen until ice ball extended beyond lesion: Yes   Outcome: patient tolerated procedure well with no complications   Post-procedure details: wound care instructions given    Neoplasm of uncertain behavior of skin mid vertex scalp  Epidermal / dermal shaving  Lesion diameter (cm):  2.1 Informed consent: discussed and consent obtained   Timeout: patient name, date of birth, surgical site, and procedure verified   Procedure prep:  Patient was prepped and draped in usual sterile fashion Prep type:  Isopropyl alcohol Anesthesia: the lesion was anesthetized in a standard fashion   Anesthetic:  1% lidocaine w/ epinephrine 1-100,000 buffered w/ 8.4% NaHCO3 Instrument used: flexible razor blade   Hemostasis achieved with: pressure, aluminum chloride and electrodesiccation   Outcome: patient tolerated procedure well   Post-procedure details: sterile dressing applied and wound care instructions given   Dressing type: bandage and petrolatum    Destruction of lesion Complexity: extensive   Destruction method: electrodesiccation and curettage  Informed consent: discussed and consent obtained   Timeout:  patient name, date of birth, surgical site, and procedure verified Procedure prep:   Patient was prepped and draped in usual sterile fashion Prep type:  Isopropyl alcohol Anesthesia: the lesion was anesthetized in a standard fashion   Anesthetic:  1% lidocaine w/ epinephrine 1-100,000 buffered w/ 8.4% NaHCO3 Curettage performed in three different directions: Yes   Electrodesiccation performed over the curetted area: Yes   Lesion length (cm):  2.1 Lesion width (cm):  2.1 Margin per side (cm):  0.2 Final wound size (cm):  2.5 Hemostasis achieved with:  pressure, aluminum chloride and electrodesiccation Outcome: patient tolerated procedure well with no complications   Post-procedure details: sterile dressing applied and wound care instructions given   Dressing type: bandage and petrolatum    Specimen 1 - Surgical pathology Differential Diagnosis: D48.5 r/o SCC vs hypertrophic AK ED&C today Check Margins: No  AK (actinic keratosis) (2) Scalp x 1, nose x 1  Actinic keratoses are precancerous spots that appear secondary to cumulative UV radiation exposure/sun exposure over time. They are chronic with expected duration over 1 year. A portion of actinic keratoses will progress to squamous cell carcinoma of the skin. It is not possible to reliably predict which spots will progress to skin cancer and so treatment is recommended to prevent development of skin cancer.  Recommend daily broad spectrum sunscreen SPF 30+ to sun-exposed areas, reapply every 2 hours as needed.  Recommend staying in the shade or wearing long sleeves, sun glasses (UVA+UVB protection) and wide brim hats (4-inch brim around the entire circumference of the hat). Call for new or changing lesions.   Destruction of lesion - Scalp x 1, nose x 1 (2) Complexity: simple   Destruction method: cryotherapy   Informed consent: discussed and consent obtained   Timeout:  patient name, date of birth, surgical site, and procedure verified Lesion destroyed using liquid nitrogen: Yes   Region frozen until ice ball  extended beyond lesion: Yes   Outcome: patient tolerated procedure well with no complications   Post-procedure details: wound care instructions given     Return in about 6 months (around 04/12/2023) for TBSE.  Maylene Roes, CMA, am acting as scribe for Armida Sans, MD .  Documentation: I have reviewed the above documentation for accuracy and completeness, and I agree with the above.  Armida Sans, MD

## 2022-10-16 ENCOUNTER — Encounter: Payer: Self-pay | Admitting: Dermatology

## 2022-10-17 ENCOUNTER — Telehealth: Payer: Self-pay

## 2022-10-17 NOTE — Telephone Encounter (Signed)
Discussed pathology results. Patient voiced understanding.

## 2022-10-17 NOTE — Telephone Encounter (Signed)
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

## 2023-04-24 ENCOUNTER — Ambulatory Visit: Payer: Medicare Other | Admitting: Dermatology

## 2023-05-01 ENCOUNTER — Ambulatory Visit (INDEPENDENT_AMBULATORY_CARE_PROVIDER_SITE_OTHER): Payer: Medicare Other | Admitting: Dermatology

## 2023-05-01 DIAGNOSIS — I872 Venous insufficiency (chronic) (peripheral): Secondary | ICD-10-CM

## 2023-05-01 DIAGNOSIS — W908XXA Exposure to other nonionizing radiation, initial encounter: Secondary | ICD-10-CM

## 2023-05-01 DIAGNOSIS — D692 Other nonthrombocytopenic purpura: Secondary | ICD-10-CM

## 2023-05-01 DIAGNOSIS — Z1283 Encounter for screening for malignant neoplasm of skin: Secondary | ICD-10-CM

## 2023-05-01 DIAGNOSIS — Z8589 Personal history of malignant neoplasm of other organs and systems: Secondary | ICD-10-CM

## 2023-05-01 DIAGNOSIS — D229 Melanocytic nevi, unspecified: Secondary | ICD-10-CM

## 2023-05-01 DIAGNOSIS — Z86018 Personal history of other benign neoplasm: Secondary | ICD-10-CM

## 2023-05-01 DIAGNOSIS — D1801 Hemangioma of skin and subcutaneous tissue: Secondary | ICD-10-CM

## 2023-05-01 DIAGNOSIS — Z872 Personal history of diseases of the skin and subcutaneous tissue: Secondary | ICD-10-CM

## 2023-05-01 DIAGNOSIS — L578 Other skin changes due to chronic exposure to nonionizing radiation: Secondary | ICD-10-CM | POA: Diagnosis not present

## 2023-05-01 DIAGNOSIS — L300 Nummular dermatitis: Secondary | ICD-10-CM

## 2023-05-01 DIAGNOSIS — L814 Other melanin hyperpigmentation: Secondary | ICD-10-CM

## 2023-05-01 DIAGNOSIS — Z79899 Other long term (current) drug therapy: Secondary | ICD-10-CM

## 2023-05-01 DIAGNOSIS — L821 Other seborrheic keratosis: Secondary | ICD-10-CM

## 2023-05-01 DIAGNOSIS — Z7189 Other specified counseling: Secondary | ICD-10-CM

## 2023-05-01 DIAGNOSIS — Z85828 Personal history of other malignant neoplasm of skin: Secondary | ICD-10-CM

## 2023-05-01 MED ORDER — TACROLIMUS 0.1 % EX OINT: TOPICAL_OINTMENT | Freq: Two times a day (BID) | CUTANEOUS | 8 refills | Status: AC

## 2023-05-01 NOTE — Patient Instructions (Signed)

## 2023-05-01 NOTE — Progress Notes (Signed)
 Follow-Up Visit   Subjective  [name] [name] is a 83 y.o. male who presents for the following: Skin Cancer Screening and Full Body Skin Exam Hx of scc, hx of dysplastic nevi, hx of isks, and hx of aks at scalp and nose   Hx of isks at right frontal scalp  The patient presents for Total-Body Skin Exam (TBSE) for skin cancer screening and mole check. The patient has spots, moles and lesions to be evaluated, some may be new or changing and the patient may have concern these could be cancer.  The following portions of the chart were reviewed this encounter and updated as appropriate: medications, allergies, medical history  Review of Systems:  No other skin or systemic complaints except as noted in HPI or Assessment and Plan.  Objective  Well appearing patient in no apparent distress; mood and affect are within normal limits.  A full examination was performed including scalp, head, eyes, ears, nose, lips, neck, chest, axillae, abdomen, back, buttocks, bilateral upper extremities, bilateral lower extremities, hands, feet, fingers, toes, fingernails, and toenails. All findings within normal limits unless otherwise noted below.   Relevant physical exam findings are noted in the Assessment and Plan.   Assessment & Plan   SKIN CANCER SCREENING PERFORMED TODAY.  ACTINIC DAMAGE - Chronic condition, secondary to cumulative UV/sun exposure - diffuse scaly erythematous macules with underlying dyspigmentation - Recommend daily broad spectrum sunscreen SPF 30+ to sun-exposed areas, reapply every 2 hours as needed.  - Staying in the shade or wearing long sleeves, sun glasses (UVA+UVB protection) and wide brim hats (4-inch brim around the entire circumference of the hat) are also recommended for sun protection.  - Call for new or changing lesions.  LENTIGINES, SEBORRHEIC KERATOSES, HEMANGIOMAS - Benign normal skin lesions - Benign-appearing - Call for any changes  MELANOCYTIC NEVI - Tan-brown  and/or pink-flesh-colored symmetric macules and papules - Benign appearing on exam today - Observation - Call clinic for new or changing moles - Recommend daily use of broad spectrum spf 30+ sunscreen to sun-exposed areas.   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 Chronic and persistent condition with duration or expected duration over one year. Condition is symptomatic/ bothersome to patient. Not currently at goal. Stasis in the legs causes chronic leg swelling, which may result in itchy or painful rashes, skin discoloration, skin texture changes, and sometimes ulceration.  Recommend daily graduated compression hose/stockings- easiest to put on first thing in morning, remove at bedtime.  Elevate legs as much as possible. Avoid salt/sodium rich foods. Treatment Plan: Using tacrolimus ointment  Using compression stockings  Purpura - Chronic; persistent and recurrent.  Treatable, but not curable. - Violaceous macules and patches - Benign - Related to trauma, age, sun damage and/or use of blood thinners, chronic use of topical and/or oral steroids - Observe - Can use OTC arnica containing moisturizer such as Dermend Bruise Formula if desired - Call for worsening or other concerns  ATOPIC DERMATITIS with stasis changes  Exam: Scaly pink papules coalescing to plaques 8% BSA Legs Chronic and persistent condition with duration or expected duration over one year. Condition is symptomatic / bothersome to patient. Not to goal. Atopic dermatitis (eczema) is a chronic, relapsing, pruritic condition that can significantly affect quality of life. It is often associated with allergic rhinitis and/or asthma and can require treatment with topical medications, phototherapy, or in severe cases biologic injectable medication (Dupixent;  Adbry) or Oral JAK inhibitors.  Treatment Plan: Start tacrolimus  ointment   Recommend gentle skin care.   HISTORY OF SQUAMOUS CELL CARCINOMA OF THE SKIN Mid vertex scalp - WD scc acantholytic variant ED&C done 10/10/2022 - No evidence of recurrence today - No lymphadenopathy - Recommend regular full body skin exams - Recommend daily broad spectrum sunscreen SPF 30+ to sun-exposed areas, reapply every 2 hours as needed.  - Call if any new or changing lesions are noted between office visits  HISTORY OF DYSPLASTIC NEVUS Right calf - moderate 12/2019 No evidence of recurrence today Recommend regular full body skin exams Recommend daily broad spectrum sunscreen SPF 30+ to sun-exposed areas, reapply every 2 hours as needed.  Call if any new or changing lesions are noted between office visits   NUMMULAR DERMATITIS   Related Medications tacrolimus (PROTOPIC) 0.1 % ointment Apply topically 2 (two) times daily. To affected areas on legs STASIS DERMATITIS OF BOTH LEGS   Related Medications mupirocin ointment (BACTROBAN) 2 % Apply 1 application topically daily. tacrolimus (PROTOPIC) 0.1 % ointment Apply topically 2 (two) times daily. To affected areas on legs Return in about 1 year (around 04/30/2024) for TBSE.  IAsher Muir, CMA, am acting as scribe for Armida Sans, MD.   Documentation: I have reviewed the above documentation for accuracy and completeness, and I agree with the above.  Armida Sans, MD

## 2023-05-05 ENCOUNTER — Encounter: Payer: Self-pay | Admitting: Dermatology

## 2023-07-22 ENCOUNTER — Encounter (INDEPENDENT_AMBULATORY_CARE_PROVIDER_SITE_OTHER): Payer: Self-pay

## 2023-09-23 ENCOUNTER — Telehealth (INDEPENDENT_AMBULATORY_CARE_PROVIDER_SITE_OTHER): Payer: Self-pay | Admitting: Vascular Surgery

## 2023-09-23 NOTE — Telephone Encounter (Signed)
 LVM for pt advising to go ahead and call radiology scheduling at [phone] and schedule the CT appt for 1-2 weeks prior to the 9.5.25 appointment with Dr. Marea. That way the results will be ready for the 9.5.25 appt with Dr. Marea.

## 2023-10-16 ENCOUNTER — Ambulatory Visit
Admission: RE | Admit: 2023-10-16 | Discharge: 2023-10-16 | Disposition: A | Discharge status: Home / Self Care | Source: Ambulatory Visit | Attending: Vascular Surgery | Admitting: Vascular Surgery

## 2023-10-16 ENCOUNTER — Other Ambulatory Visit (INDEPENDENT_AMBULATORY_CARE_PROVIDER_SITE_OTHER): Payer: Self-pay | Admitting: Vascular Surgery

## 2023-10-16 DIAGNOSIS — I7121 Aneurysm of the ascending aorta, without rupture: Secondary | ICD-10-CM

## 2023-10-16 DIAGNOSIS — I10 Essential (primary) hypertension: Secondary | ICD-10-CM

## 2023-10-16 DIAGNOSIS — I723 Aneurysm of iliac artery: Secondary | ICD-10-CM

## 2023-10-16 DIAGNOSIS — E785 Hyperlipidemia, unspecified: Secondary | ICD-10-CM

## 2023-10-16 LAB — POCT I-STAT CREATININE: Creatinine, Ser: 2.2 mg/dL — ABNORMAL HIGH (ref 0.61–1.24)

## 2023-10-16 MED ORDER — IOHEXOL 350 MG/ML SOLN: 75.0000 mL | Freq: Once | INTRAVENOUS | Status: DC | PRN

## 2023-11-07 ENCOUNTER — Encounter (INDEPENDENT_AMBULATORY_CARE_PROVIDER_SITE_OTHER): Payer: Self-pay | Admitting: Vascular Surgery

## 2023-11-07 ENCOUNTER — Ambulatory Visit (INDEPENDENT_AMBULATORY_CARE_PROVIDER_SITE_OTHER): Payer: Medicare Other | Admitting: Vascular Surgery

## 2023-11-07 ENCOUNTER — Other Ambulatory Visit (INDEPENDENT_AMBULATORY_CARE_PROVIDER_SITE_OTHER): Payer: Medicare Other

## 2023-11-07 VITALS — BP 130/69 | HR 56 | Wt 242.5 lb

## 2023-11-07 DIAGNOSIS — I77811 Abdominal aortic ectasia: Secondary | ICD-10-CM

## 2023-11-07 DIAGNOSIS — I10 Essential (primary) hypertension: Secondary | ICD-10-CM | POA: Diagnosis not present

## 2023-11-07 DIAGNOSIS — I723 Aneurysm of iliac artery: Secondary | ICD-10-CM | POA: Diagnosis not present

## 2023-11-07 DIAGNOSIS — I7121 Aneurysm of the ascending aorta, without rupture: Secondary | ICD-10-CM

## 2023-11-07 NOTE — Assessment & Plan Note (Signed)
blood pressure control important in reducing the progression of atherosclerotic disease and aneurysmal growth. On appropriate oral medications.  

## 2023-11-07 NOTE — Assessment & Plan Note (Signed)
 Duplex today shows an ectatic abdominal aorta with a maximal diameter of approximately 2.8 cm.  Iliac arteries are not particular well-visualized today.  No major changes from his previous study.  We can check this every other year with duplex.

## 2023-11-07 NOTE — Progress Notes (Signed)
 MRN : [id number]  [name] [name] is a 83 y.o. ([birth year]) male who presents with chief complaint of  Chief Complaint  Patient presents with   Follow-up  .  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  He is doing well.  He has no specific complaints today.  He denies any aneurysm related symptoms. Specifically, the patient denies new back or abdominal pain, or signs of peripheral embolization.  He underwent a recent CT scan of the chest which I have independently reviewed.  This shows a stable 4.6 cm a sending thoracic aortic aneurysm with only slight growth from 2 years ago. He is also evaluated for ectasia of the abdominal aorta. Duplex today shows an ectatic abdominal aorta with a maximal diameter of approximately 2.8 cm.  Iliac arteries are not particular well-visualized today.  No major changes from his previous study.    Current Outpatient Medications  Medication Sig Dispense Refill   aspirin  EC 81 MG tablet Take 40.5 mg by mouth daily. 1/2 tablet (40.5mg ) by mouth daily     clobetasol  ointment (TEMOVATE ) 0.05 % apply twice daily as needed to affected areas for two weeks then apply twice daily on weekends only. Avoid applying to face, groin, and axilla. Use as directed. Long-term use can cause thinning of the skin. 60 g 1   Cyanocobalamin  (B-12) 2500 MCG TABS Take 2,500 mcg by mouth daily.     gabapentin  (NEURONTIN ) 300 MG capsule Take 300 mg by mouth at bedtime.      levocetirizine (XYZAL ) 5 MG tablet Take 5 mg by mouth every evening.     levothyroxine  (SYNTHROID , LEVOTHROID) 50 MCG tablet Take 50 mcg by mouth daily before breakfast.     lisinopril  (PRINIVIL ,ZESTRIL ) 5 MG tablet Take 5 mg by mouth at bedtime.      lisinopril  (ZESTRIL ) 5 MG tablet Take 1 tablet by mouth daily.     metFORMIN  (GLUCOPHAGE ) 500 MG tablet Take 500 mg by mouth 2 (two) times daily.      Multiple Vitamins-Minerals  (PRESERVISION AREDS 2) CAPS Take 1 capsule by mouth 2 (two) times daily.     mupirocin  ointment (BACTROBAN ) 2 % Apply 1 application topically daily. 22 g 1   nitroGLYCERIN  (NITROSTAT ) 0.4 MG SL tablet Place 1 tablet under the tongue as needed. For chest pain     omeprazole (PRILOSEC) 40 MG capsule Take 40 mg by mouth daily.      ondansetron  (ZOFRAN -ODT) 4 MG disintegrating tablet Take by mouth.     rosuvastatin  (CRESTOR ) 20 MG tablet Take 1 tablet by mouth daily.     sildenafil (REVATIO) 20 MG tablet Take 40-60 mg by mouth daily as needed (erectile dysfunction).     SOLIQUA 100-33 UNT-MCG/ML SOPN 45 units SQ daily     tacrolimus  (PROTOPIC ) 0.1 % ointment Apply topically 2 (two) times daily. To affected areas on legs 100 g 8   terbinafine  (LAMISIL ) 250 MG tablet Take 1 tablet (250 mg total) by mouth daily. Discontinue Crestor  while taking this medication. 30 tablet 1   traMADol  (ULTRAM ) 50 MG tablet Take by mouth.     No current facility-administered medications for this visit.    Past Medical History:  Diagnosis Date   Anemia    Arthritis    CAD (coronary artery disease)    Chronic kidney disease    CKD stage 3   DDD (degenerative disc disease), thoracic  Diabetes mellitus without complication (HCC)    Eczema    GERD (gastroesophageal reflux disease)    Heart murmur    History of dysplastic nevus 12/09/2019   right calf/moderate   History of heart artery stent 2014   Per patient, [facility] put in 2 stents in 2014.   History of MRSA infection 02/2013   groin, after PTCA   Hyperlipidemia    Hypertension    Hypothyroidism    Macular degeneration    Myocardial infarction (HCC) 1999   Per Pt, MI in 1999.   PVD (peripheral vascular disease) (HCC)    Squamous cell carcinoma of skin 10/10/2022   Mid vertex scalp. WD SCC, acantholytic variant. [facility]    Past Surgical History:  Procedure Laterality Date   BACK SURGERY     BICEPT TENODESIS Left 05/13/2017   Procedure: BICEPS  TENODESIS;  Surgeon: Edie Norleen PARAS, MD;  Location: [facility] OR[facility];  Service: Orthopedics;  Laterality: Left;   CHOLECYSTECTOMY N/A 10/23/2017   Procedure: LAPAROSCOPIC CHOLECYSTECTOMY;  Surgeon: Jordis Laneta FALCON, MD;  Location: [facility];  Service: General;  Laterality: N/A;   CORONARY ANGIOPLASTY     JOINT REPLACEMENT Bilateral    knee   JOINT REPLACEMENT Right    hip   SHOULDER ARTHROSCOPY WITH OPEN ROTATOR CUFF REPAIR Left 05/13/2017   Procedure: SHOULDER ARTHROSCOPY WITH OPEN ROTATOR CUFF REPAIR;  Surgeon: Edie Norleen PARAS, MD;  Location: [facility];  Service: Orthopedics;  Laterality: Left;  subacromial decompression, debridement   TONSILLECTOMY     UMBILICAL HERNIA REPAIR  10/23/2017   Procedure: HERNIA REPAIR UMBILICAL ADULT;  Surgeon: Jordis Laneta FALCON, MD;  Location: [facility];  Service: General;;   VASECTOMY       Social History   Tobacco Use   Smoking status: Former    Current packs/day: 0.00    Types: Cigarettes    Quit date: 01/04/1998    Years since quitting: 25.8   Smokeless tobacco: Never  Vaping Use   Vaping status: Never Used  Substance Use Topics   Alcohol use: Yes    Alcohol/week: 12.0 - 14.0 standard drinks of alcohol    Types: 6 - 7 Cans of beer, 6 - 7 Standard drinks or equivalent per week   Drug use: No       Family History  Problem Relation Age of Onset   Stroke Father    Diabetes Paternal Grandmother      Allergies  Allergen Reactions   Sulfamethoxazole Hives   Tomato     Breaks out   Fish Oil Rash   Sulfa Antibiotics Hives     REVIEW OF SYSTEMS (Negative unless checked)  Constitutional: [] Weight loss  [] Fever  [] Chills Cardiac: [] Chest pain   [] Chest pressure   [] Palpitations   [] Shortness of breath when laying flat   [] Shortness of breath at rest   [] Shortness of breath with exertion. Vascular:  [] Pain in legs with walking   [] Pain in legs at rest   [] Pain in legs when laying flat   [] Claudication   [] Pain in feet when walking  [] Pain in feet at rest   [] Pain in feet when laying flat   [] History of DVT   [] Phlebitis   [] Swelling in legs   [] Varicose veins   [] Non-healing ulcers Pulmonary:   [] Uses home oxygen   [] Productive cough   [] Hemoptysis   [] Wheeze  [] COPD   [] Asthma Neurologic:  [] Dizziness  [] Blackouts   [] Seizures   [] History of stroke   [] History of  TIA  [] Aphasia   [] Temporary blindness   [] Dysphagia   [] Weakness or numbness in arms   [] Weakness or numbness in legs Musculoskeletal:  [x] Arthritis   [] Joint swelling   [] Joint pain   [] Low back pain Hematologic:  [] Easy bruising  [] Easy bleeding   [] Hypercoagulable state   [] Anemic   Gastrointestinal:  [] Blood in stool   [] Vomiting blood  [x] Gastroesophageal reflux/heartburn   [] Abdominal pain Genitourinary:  [x] Chronic kidney disease   [] Difficult urination  [] Frequent urination  [] Burning with urination   [] Hematuria Skin:  [] Rashes   [] Ulcers   [] Wounds Psychological:  [] History of anxiety   []  History of major depression.  Physical Examination  BP 130/69   Pulse (!) 56   Wt 242 lb 8 oz (110 kg)   BMI 32.89 kg/m  Gen:  WD/WN, NAD. Appears younger than stated age. Head: [place]/AT, No temporalis wasting. Ear/Nose/Throat: Hearing grossly intact, nares w/o erythema or drainage Eyes: Conjunctiva clear. Sclera non-icteric Neck: Supple.  Trachea midline Pulmonary:  Good air movement, no use of accessory muscles.  Cardiac: bradycardic Vascular:  Vessel Right Left  Radial Palpable Palpable                                   Gastrointestinal: soft, non-tender/non-distended. No guarding/reflex.  Musculoskeletal: M/S 5/5 throughout.  No deformity or atrophy. No edema. Neurologic: Sensation grossly intact in extremities.  Symmetrical.  Speech is fluent.  Psychiatric: Judgment intact, Mood & affect appropriate for pt's clinical situation. Dermatologic: No rashes or ulcers noted.  No cellulitis or open wounds.      Labs Recent Results (from the past 2160 hours)  I-STAT  creatinine     Status: Abnormal   Collection Time: 10/16/23 11:41 AM  Result Value Ref Range   Creatinine, Ser 2.20 (H) 0.61 - 1.24 mg/dL    Radiology CT CHEST WO CONTRAST Result Date: 10/30/2023 CLINICAL DATA:  Aortic aneurysm suspected. EXAM: CT CHEST WITHOUT CONTRAST TECHNIQUE: Multidetector CT imaging of the chest was performed following the standard protocol without IV contrast. RADIATION DOSE REDUCTION: This exam was performed according to the departmental dose-optimization program which includes automated exposure control, adjustment of the mA and/or kV according to patient size and/or use of iterative reconstruction technique. COMPARISON:  Chest CT dated 07/21/2021. FINDINGS: Evaluation of this exam is limited in the absence of intravenous contrast. Cardiovascular: There is no cardiomegaly or pericardial effusion. There is advanced 3 vessel coronary vascular calcification. Dilated ascending aorta measures up to 4.6 cm in diameter. There is mild atherosclerotic calcification of the aorta. The central pulmonary arteries are grossly unremarkable. Mediastinum/Nodes: No hilar or mediastinal adenopathy. The esophagus is grossly unremarkable. No mediastinal fluid collection. Lungs/Pleura: Diffuse bilateral subpleural ground-glass density and reticulation with interstitial coarsening. Mild bilateral lower lobe bronchiectasis. No focal consolidation, pleural effusion, pneumothorax. The central airways are patent. Upper Abdomen: Cirrhosis.  Cholecystectomy. Musculoskeletal: Degenerative changes of the spine. No acute osseous pathology. IMPRESSION: 1. No acute intrathoracic pathology. 2. Dilated ascending aorta measures up to 4.6 cm in diameter. Ascending thoracic aortic aneurysm. Recommend semi-annual imaging followup by CTA or MRA and referral to cardiothoracic surgery if not already obtained. This recommendation follows 2010 ACCF/AHA/AATS/ACR/ASA/SCA/SCAI/SIR/STS/SVM Guidelines for the Diagnosis and  Management of Patients With Thoracic Aortic Disease. Circulation. 2010; 121: [id number]. Aortic aneurysm NOS (ICD10-I71.9) 3. Chronic interstitial lung disease. 4. Cirrhosis. 5.  Aortic Atherosclerosis (ICD10-I70.0). Electronically Signed   By: Vanetta Shelia HERO.D.  On: 10/30/2023 20:32    Assessment/Plan  Aneurysm of thoracic aorta (HCC) He underwent a recent CT scan of the chest which I have independently reviewed.  This shows a stable 4.6 cm a sending thoracic aortic aneurysm with only slight growth from 2 years ago.  No significant risk at this level and not large enough to consider repair.  We will plan on checking this annually with CT scan of the chest.  Hypertension blood pressure control important in reducing the progression of atherosclerotic disease and aneurysmal growth. On appropriate oral medications.   Aortic ectasia, abdominal (HCC) Duplex today shows an ectatic abdominal aorta with a maximal diameter of approximately 2.8 cm.  Iliac arteries are not particular well-visualized today.  No major changes from his previous study.  We can check this every other year with duplex.    Selinda Gu, MD  11/07/2023 11:23 AM    This note was created with Dragon medical transcription system.  Any errors from dictation are purely unintentional

## 2023-11-07 NOTE — Assessment & Plan Note (Signed)
 He underwent a recent CT scan of the chest which I have independently reviewed.  This shows a stable 4.6 cm a sending thoracic aortic aneurysm with only slight growth from 2 years ago.  No significant risk at this level and not large enough to consider repair.  We will plan on checking this annually with CT scan of the chest.

## 2024-05-04 ENCOUNTER — Ambulatory Visit: Payer: Medicare Other | Admitting: Dermatology
